# Patient Record
Sex: Male | Born: 1960 | Race: White | Hispanic: No | Marital: Single | Smoking: Never smoker
Health system: Southern US, Community
[De-identification: ages and names within clinical notes are randomized; demographics above are authoritative.]

## PROBLEM LIST (undated history)

## (undated) DIAGNOSIS — T790XXA Air embolism (traumatic), initial encounter: Secondary | ICD-10-CM

## (undated) DIAGNOSIS — E785 Hyperlipidemia, unspecified: Secondary | ICD-10-CM

## (undated) DIAGNOSIS — I10 Essential (primary) hypertension: Secondary | ICD-10-CM

## (undated) DIAGNOSIS — K5190 Ulcerative colitis, unspecified, without complications: Secondary | ICD-10-CM

## (undated) DIAGNOSIS — R1310 Dysphagia, unspecified: Secondary | ICD-10-CM

## (undated) DIAGNOSIS — I639 Cerebral infarction, unspecified: Secondary | ICD-10-CM

## (undated) HISTORY — PX: NO PAST SURGERIES: SHX2092

---

## 2006-06-06 ENCOUNTER — Emergency Department: Payer: Self-pay | Admitting: Emergency Medicine

## 2009-04-18 ENCOUNTER — Emergency Department: Payer: Self-pay | Admitting: Emergency Medicine

## 2009-10-26 ENCOUNTER — Emergency Department: Payer: Self-pay | Admitting: Emergency Medicine

## 2013-09-09 ENCOUNTER — Inpatient Hospital Stay: Payer: Self-pay | Admitting: Internal Medicine

## 2013-09-09 DIAGNOSIS — R55 Syncope and collapse: Secondary | ICD-10-CM

## 2013-09-09 DIAGNOSIS — R7989 Other specified abnormal findings of blood chemistry: Secondary | ICD-10-CM

## 2013-09-09 LAB — COMPREHENSIVE METABOLIC PANEL
Anion Gap: 6 — ABNORMAL LOW (ref 7–16)
BUN: 8 mg/dL (ref 7–18)
Bilirubin,Total: 0.8 mg/dL (ref 0.2–1.0)
Calcium, Total: 8.3 mg/dL — ABNORMAL LOW (ref 8.5–10.1)
EGFR (Non-African Amer.): >60
Glucose: 102 mg/dL — ABNORMAL HIGH (ref 65–99)
SGOT(AST): 19 U/L (ref 15–37)
SGPT (ALT): 15 U/L (ref 12–78)
Sodium: 137 mmol/L (ref 136–145)

## 2013-09-09 LAB — URINALYSIS, COMPLETE
Bilirubin,UR: NEGATIVE
Blood: NEGATIVE
Nitrite: NEGATIVE
Ph: 6 (ref 4.5–8.0)
RBC,UR: <1 /HPF (ref 0–5)
Specific Gravity: 1.011 (ref 1.003–1.030)
Squamous Epithelial: NONE SEEN
WBC UR: <1 /HPF (ref 0–5)

## 2013-09-09 LAB — TROPONIN I
Troponin-I: 0.11 ng/mL — ABNORMAL HIGH
Troponin-I: 0.06 ng/mL — ABNORMAL HIGH

## 2013-09-09 LAB — CBC
HCT: 40 % (ref 40.0–52.0)
MCH: 30.9 pg (ref 26.0–34.0)
MCHC: 34.8 g/dL (ref 32.0–36.0)
MCV: 89 fL (ref 80–100)
Platelet: 249 10*3/uL (ref 150–440)

## 2013-09-09 LAB — HEMOGLOBIN: HGB: 13.2 g/dL (ref 13.0–18.0)

## 2013-09-10 DIAGNOSIS — I059 Rheumatic mitral valve disease, unspecified: Secondary | ICD-10-CM

## 2013-09-10 LAB — COMPREHENSIVE METABOLIC PANEL
Albumin: 1.9 g/dL — ABNORMAL LOW (ref 3.4–5.0)
Alkaline Phosphatase: 63 U/L (ref 50–136)
Chloride: 109 mmol/L — ABNORMAL HIGH (ref 98–107)
Co2: 24 mmol/L (ref 21–32)
Creatinine: 0.81 mg/dL (ref 0.60–1.30)
EGFR (Non-African Amer.): >60
Osmolality: 281 (ref 275–301)
Potassium: 3.7 mmol/L (ref 3.5–5.1)
SGPT (ALT): 12 U/L (ref 12–78)
Sodium: 139 mmol/L (ref 136–145)

## 2013-09-10 LAB — CBC WITH DIFFERENTIAL/PLATELET
Basophil #: 0 10*3/uL (ref 0.0–0.1)
Basophil %: 0.2 %
Eosinophil %: 0.1 %
HGB: 12.3 g/dL — ABNORMAL LOW (ref 13.0–18.0)
MCH: 30.6 pg (ref 26.0–34.0)
MCV: 90 fL (ref 80–100)
Monocyte #: 0.3 x10 3/mm (ref 0.2–1.0)
Monocyte %: 4.5 %
Neutrophil %: 88.1 %
RBC: 4.02 10*6/uL — ABNORMAL LOW (ref 4.40–5.90)
WBC: 7.1 10*3/uL (ref 3.8–10.6)

## 2013-09-10 LAB — CK TOTAL AND CKMB (NOT AT ARMC)
CK, Total: 27 U/L — ABNORMAL LOW (ref 35–232)
CK-MB: 1.4 ng/mL (ref 0.5–3.6)

## 2013-09-10 LAB — MAGNESIUM: Magnesium: 1.5 mg/dL — ABNORMAL LOW

## 2013-09-10 LAB — CLOSTRIDIUM DIFFICILE BY PCR

## 2013-09-11 LAB — BASIC METABOLIC PANEL
BUN: 9 mg/dL (ref 7–18)
Calcium, Total: 7.7 mg/dL — ABNORMAL LOW (ref 8.5–10.1)
Chloride: 111 mmol/L — ABNORMAL HIGH (ref 98–107)
Creatinine: 0.93 mg/dL (ref 0.60–1.30)
EGFR (Non-African Amer.): >60
Glucose: 190 mg/dL — ABNORMAL HIGH (ref 65–99)
Sodium: 140 mmol/L (ref 136–145)

## 2013-09-11 LAB — MAGNESIUM: Magnesium: 1.9 mg/dL

## 2013-09-12 LAB — STOOL CULTURE

## 2013-12-17 ENCOUNTER — Emergency Department: Payer: Self-pay | Admitting: Emergency Medicine

## 2014-04-26 ENCOUNTER — Observation Stay: Payer: Self-pay | Admitting: Internal Medicine

## 2014-04-26 LAB — URINALYSIS, COMPLETE
BLOOD: NEGATIVE
Bacteria: NONE SEEN
Bilirubin,UR: NEGATIVE
GLUCOSE, UR: NEGATIVE mg/dL (ref 0–75)
Ketone: NEGATIVE
LEUKOCYTE ESTERASE: NEGATIVE
NITRITE: NEGATIVE
PROTEIN: NEGATIVE
Ph: 6 (ref 4.5–8.0)
RBC,UR: NONE SEEN /HPF (ref 0–5)
SPECIFIC GRAVITY: 1.008 (ref 1.003–1.030)
SQUAMOUS EPITHELIAL: NONE SEEN
WBC UR: 1 /HPF (ref 0–5)

## 2014-04-26 LAB — COMPREHENSIVE METABOLIC PANEL
ALK PHOS: 59 U/L
Albumin: 2.9 g/dL — ABNORMAL LOW (ref 3.4–5.0)
Anion Gap: 7 (ref 7–16)
BILIRUBIN TOTAL: 0.4 mg/dL (ref 0.2–1.0)
BUN: 10 mg/dL (ref 7–18)
CHLORIDE: 102 mmol/L (ref 98–107)
CREATININE: 1.12 mg/dL (ref 0.60–1.30)
Calcium, Total: 8.6 mg/dL (ref 8.5–10.1)
Co2: 25 mmol/L (ref 21–32)
EGFR (African American): >60
EGFR (Non-African Amer.): >60
Glucose: 188 mg/dL — ABNORMAL HIGH (ref 65–99)
Osmolality: 272 (ref 275–301)
Potassium: 3.6 mmol/L (ref 3.5–5.1)
SGOT(AST): 11 U/L — ABNORMAL LOW (ref 15–37)
SGPT (ALT): 15 U/L (ref 12–78)
Sodium: 134 mmol/L — ABNORMAL LOW (ref 136–145)
Total Protein: 6.7 g/dL (ref 6.4–8.2)

## 2014-04-26 LAB — HEMOGLOBIN: HGB: 8.8 g/dL — AB (ref 13.0–18.0)

## 2014-04-26 LAB — CBC WITH DIFFERENTIAL/PLATELET
Basophil #: 0 10*3/uL (ref 0.0–0.1)
Basophil %: 0.5 %
EOS ABS: 0.1 10*3/uL (ref 0.0–0.7)
Eosinophil %: 1 %
HCT: 29.7 % — AB (ref 40.0–52.0)
HGB: 9 g/dL — ABNORMAL LOW (ref 13.0–18.0)
LYMPHS PCT: 5.9 %
Lymphocyte #: 0.5 10*3/uL — ABNORMAL LOW (ref 1.0–3.6)
MCH: 19.5 pg — ABNORMAL LOW (ref 26.0–34.0)
MCHC: 30.3 g/dL — AB (ref 32.0–36.0)
MCV: 65 fL — ABNORMAL LOW (ref 80–100)
Monocyte #: 0.5 x10 3/mm (ref 0.2–1.0)
Monocyte %: 5.7 %
NEUTROS ABS: 8 10*3/uL — AB (ref 1.4–6.5)
Neutrophil %: 86.9 %
Platelet: 204 10*3/uL (ref 150–440)
RBC: 4.61 10*6/uL (ref 4.40–5.90)
RDW: 19 % — ABNORMAL HIGH (ref 11.5–14.5)
WBC: 9.3 10*3/uL (ref 3.8–10.6)

## 2014-04-26 LAB — IRON AND TIBC
Iron Bind.Cap.(Total): 325 ug/dL (ref 250–450)
Iron Saturation: 5 %
Iron: 16 ug/dL — ABNORMAL LOW (ref 65–175)
Unbound Iron-Bind.Cap.: 309 ug/dL

## 2014-04-26 LAB — TROPONIN I
TROPONIN-I: 0.06 ng/mL — AB
TROPONIN-I: 0.1 ng/mL — AB
TROPONIN-I: 0.13 ng/mL — AB

## 2014-04-26 LAB — FERRITIN: FERRITIN (ARMC): 4 ng/mL — AB (ref 8–388)

## 2014-04-27 LAB — CBC WITH DIFFERENTIAL/PLATELET
BASOS PCT: 0.1 %
Basophil #: 0 10*3/uL (ref 0.0–0.1)
Eosinophil #: 0 10*3/uL (ref 0.0–0.7)
Eosinophil %: 0 %
HCT: 28.8 % — ABNORMAL LOW (ref 40.0–52.0)
HGB: 8.7 g/dL — ABNORMAL LOW (ref 13.0–18.0)
LYMPHS ABS: 0.5 10*3/uL — AB (ref 1.0–3.6)
LYMPHS PCT: 9.2 %
MCH: 19.6 pg — ABNORMAL LOW (ref 26.0–34.0)
MCHC: 30.1 g/dL — ABNORMAL LOW (ref 32.0–36.0)
MCV: 65 fL — ABNORMAL LOW (ref 80–100)
MONO ABS: 0.1 x10 3/mm — AB (ref 0.2–1.0)
Monocyte %: 1.9 %
Neutrophil #: 4.4 10*3/uL (ref 1.4–6.5)
Neutrophil %: 88.8 %
Platelet: 200 10*3/uL (ref 150–440)
RBC: 4.41 10*6/uL (ref 4.40–5.90)
RDW: 18.5 % — ABNORMAL HIGH (ref 11.5–14.5)
WBC: 4.9 10*3/uL (ref 3.8–10.6)

## 2014-04-27 LAB — BASIC METABOLIC PANEL
Anion Gap: 4 — ABNORMAL LOW (ref 7–16)
BUN: 5 mg/dL — ABNORMAL LOW (ref 7–18)
CHLORIDE: 110 mmol/L — AB (ref 98–107)
CO2: 24 mmol/L (ref 21–32)
Calcium, Total: 8.7 mg/dL (ref 8.5–10.1)
Creatinine: 1.07 mg/dL (ref 0.60–1.30)
EGFR (African American): >60
Glucose: 144 mg/dL — ABNORMAL HIGH (ref 65–99)
OSMOLALITY: 275 (ref 275–301)
Potassium: 4.2 mmol/L (ref 3.5–5.1)
SODIUM: 138 mmol/L (ref 136–145)

## 2014-06-16 ENCOUNTER — Emergency Department: Payer: Self-pay | Admitting: Emergency Medicine

## 2014-06-16 LAB — COMPREHENSIVE METABOLIC PANEL
ALT: 18 U/L (ref 12–78)
ANION GAP: 7 (ref 7–16)
Albumin: 2.3 g/dL — ABNORMAL LOW (ref 3.4–5.0)
Alkaline Phosphatase: 66 U/L
BUN: 14 mg/dL (ref 7–18)
Bilirubin,Total: 0.3 mg/dL (ref 0.2–1.0)
CHLORIDE: 107 mmol/L (ref 98–107)
CREATININE: 1 mg/dL (ref 0.60–1.30)
Calcium, Total: 8.5 mg/dL (ref 8.5–10.1)
Co2: 24 mmol/L (ref 21–32)
EGFR (Non-African Amer.): >60
Glucose: 110 mg/dL — ABNORMAL HIGH (ref 65–99)
Osmolality: 277 (ref 275–301)
POTASSIUM: 3.7 mmol/L (ref 3.5–5.1)
SGOT(AST): 14 U/L — ABNORMAL LOW (ref 15–37)
Sodium: 138 mmol/L (ref 136–145)
TOTAL PROTEIN: 6.4 g/dL (ref 6.4–8.2)

## 2014-06-16 LAB — CBC
HCT: 26.9 % — ABNORMAL LOW (ref 40.0–52.0)
HGB: 8 g/dL — AB (ref 13.0–18.0)
MCH: 19.3 pg — ABNORMAL LOW (ref 26.0–34.0)
MCHC: 29.8 g/dL — AB (ref 32.0–36.0)
MCV: 65 fL — ABNORMAL LOW (ref 80–100)
Platelet: 220 10*3/uL (ref 150–440)
RBC: 4.15 10*6/uL — AB (ref 4.40–5.90)
RDW: 20.3 % — AB (ref 11.5–14.5)
WBC: 7.1 10*3/uL (ref 3.8–10.6)

## 2014-06-16 LAB — TROPONIN I: Troponin-I: 0.03 ng/mL

## 2014-08-20 ENCOUNTER — Inpatient Hospital Stay: Payer: Self-pay | Admitting: Internal Medicine

## 2014-08-20 LAB — CBC WITH DIFFERENTIAL/PLATELET
BASOS ABS: 0 10*3/uL (ref 0.0–0.1)
BASOS PCT: 0.5 %
Eosinophil #: 0.3 10*3/uL (ref 0.0–0.7)
Eosinophil %: 4.3 %
HCT: 26.6 % — ABNORMAL LOW (ref 40.0–52.0)
HGB: 7.4 g/dL — ABNORMAL LOW (ref 13.0–18.0)
LYMPHS PCT: 16.8 %
Lymphocyte #: 1.1 10*3/uL (ref 1.0–3.6)
MCH: 17.2 pg — ABNORMAL LOW (ref 26.0–34.0)
MCHC: 27.9 g/dL — ABNORMAL LOW (ref 32.0–36.0)
MCV: 62 fL — ABNORMAL LOW (ref 80–100)
MONO ABS: 0.7 x10 3/mm (ref 0.2–1.0)
Monocyte %: 10.2 %
Neutrophil #: 4.6 10*3/uL (ref 1.4–6.5)
Neutrophil %: 68.2 %
PLATELETS: 236 10*3/uL (ref 150–440)
RBC: 4.3 10*6/uL — AB (ref 4.40–5.90)
RDW: 19.7 % — ABNORMAL HIGH (ref 11.5–14.5)
WBC: 6.7 10*3/uL (ref 3.8–10.6)

## 2014-08-20 LAB — BASIC METABOLIC PANEL
ANION GAP: 10 (ref 7–16)
BUN: 9 mg/dL (ref 7–18)
CALCIUM: 8.1 mg/dL — AB (ref 8.5–10.1)
CHLORIDE: 108 mmol/L — AB (ref 98–107)
CREATININE: 1.24 mg/dL (ref 0.60–1.30)
Co2: 20 mmol/L — ABNORMAL LOW (ref 21–32)
Glucose: 167 mg/dL — ABNORMAL HIGH (ref 65–99)
Osmolality: 278 (ref 275–301)
Potassium: 3.4 mmol/L — ABNORMAL LOW (ref 3.5–5.1)
Sodium: 138 mmol/L (ref 136–145)

## 2014-08-20 LAB — URINALYSIS, COMPLETE
Bacteria: NONE SEEN
Bilirubin,UR: NEGATIVE
Blood: NEGATIVE
Glucose,UR: 50 mg/dL (ref 0–75)
Ketone: NEGATIVE
Leukocyte Esterase: NEGATIVE
Nitrite: NEGATIVE
PH: 5 (ref 4.5–8.0)
Protein: 30
RBC,UR: 1 /HPF (ref 0–5)
SQUAMOUS EPITHELIAL: NONE SEEN
Specific Gravity: 1.027 (ref 1.003–1.030)
WBC UR: <1 /HPF (ref 0–5)

## 2014-08-20 LAB — TROPONIN I: Troponin-I: 0.16 ng/mL — ABNORMAL HIGH

## 2014-08-21 LAB — CBC WITH DIFFERENTIAL/PLATELET
Basophil #: 0 10*3/uL (ref 0.0–0.1)
Basophil %: 0.8 %
EOS PCT: 6.4 %
Eosinophil #: 0.3 10*3/uL (ref 0.0–0.7)
HCT: 24.9 % — AB (ref 40.0–52.0)
HGB: 7.4 g/dL — ABNORMAL LOW (ref 13.0–18.0)
LYMPHS ABS: 1 10*3/uL (ref 1.0–3.6)
Lymphocyte %: 18.9 %
MCH: 18.9 pg — ABNORMAL LOW (ref 26.0–34.0)
MCHC: 29.6 g/dL — AB (ref 32.0–36.0)
MCV: 64 fL — ABNORMAL LOW (ref 80–100)
Monocyte #: 0.7 x10 3/mm (ref 0.2–1.0)
Monocyte %: 13.1 %
NEUTROS ABS: 3.2 10*3/uL (ref 1.4–6.5)
NEUTROS PCT: 60.8 %
Platelet: 186 10*3/uL (ref 150–440)
RBC: 3.9 10*6/uL — AB (ref 4.40–5.90)
RDW: 21.8 % — ABNORMAL HIGH (ref 11.5–14.5)
WBC: 5.2 10*3/uL (ref 3.8–10.6)

## 2014-08-21 LAB — LIPID PANEL
Cholesterol: 65 mg/dL (ref 0–200)
HDL: 28 mg/dL — AB (ref 40–60)
Ldl Cholesterol, Calc: 24 mg/dL (ref 0–100)
Triglycerides: 63 mg/dL (ref 0–200)
VLDL Cholesterol, Calc: 13 mg/dL (ref 5–40)

## 2014-08-21 LAB — OCCULT BLOOD X 1 CARD TO LAB, STOOL: OCCULT BLOOD, FECES: NEGATIVE

## 2014-08-21 LAB — BASIC METABOLIC PANEL
Anion Gap: 4 — ABNORMAL LOW (ref 7–16)
BUN: 8 mg/dL (ref 7–18)
CHLORIDE: 109 mmol/L — AB (ref 98–107)
CO2: 26 mmol/L (ref 21–32)
CREATININE: 1.12 mg/dL (ref 0.60–1.30)
Calcium, Total: 8.1 mg/dL — ABNORMAL LOW (ref 8.5–10.1)
Glucose: 102 mg/dL — ABNORMAL HIGH (ref 65–99)
OSMOLALITY: 276 (ref 275–301)
Potassium: 3.4 mmol/L — ABNORMAL LOW (ref 3.5–5.1)
Sodium: 139 mmol/L (ref 136–145)

## 2014-08-21 LAB — FOLATE: FOLIC ACID: 13.6 ng/mL (ref 3.1–100.0)

## 2014-08-21 LAB — TSH
Thyroid Stimulating Horm: 0.731 u[IU]/mL
Thyroid Stimulating Horm: 0.699 u[IU]/mL

## 2014-08-21 LAB — HEMOGLOBIN A1C: Hemoglobin A1C: <3.5 % — ABNORMAL LOW (ref 4.2–6.3)

## 2014-08-21 LAB — TROPONIN I: TROPONIN-I: 0.17 ng/mL — AB

## 2014-08-21 LAB — SEDIMENTATION RATE: ERYTHROCYTE SED RATE: 17 mm/h (ref 0–20)

## 2014-08-22 LAB — BASIC METABOLIC PANEL
Anion Gap: 9 (ref 7–16)
BUN: 5 mg/dL — AB (ref 7–18)
CO2: 24 mmol/L (ref 21–32)
CREATININE: 1.08 mg/dL (ref 0.60–1.30)
Calcium, Total: 8 mg/dL — ABNORMAL LOW (ref 8.5–10.1)
Chloride: 110 mmol/L — ABNORMAL HIGH (ref 98–107)
EGFR (Non-African Amer.): >60
Glucose: 80 mg/dL (ref 65–99)
Osmolality: 281 (ref 275–301)
POTASSIUM: 3.7 mmol/L (ref 3.5–5.1)
Sodium: 143 mmol/L (ref 136–145)

## 2014-08-22 LAB — HEMOGLOBIN: HGB: 10.6 g/dL — ABNORMAL LOW (ref 13.0–18.0)

## 2014-08-22 LAB — CLOSTRIDIUM DIFFICILE(ARMC)

## 2014-08-22 LAB — MAGNESIUM: Magnesium: 1.9 mg/dL

## 2015-03-31 NOTE — Consult Note (Signed)
Pt CC: bloody diarrhea.  4 bowel movements today, no vomiting, afebrile, diarrhea better, abd not tender, CRP value is 58, Alb 1.9, WBC 7, hgb 12. Pt improving rapidly on steroids.  May go home tomorrow if can get steroids, take 60 mg a day and taper by 5 mg per week, see me in office in 2-3 weeks or [facility]  Dr, Shelle Ironein on call this weekend but will not see unless you call him about a problem.  Electronic Signatures: Scot JunElliott, Robert T (MD)  (Signed on 03-Oct-14 16:58)  Authored  Last Updated: 03-Oct-14 16:58 by Scot JunElliott, Robert T (MD)

## 2015-03-31 NOTE — Consult Note (Signed)
Pt CC: diarrhea and bleeding.  Pt feeling better already after iv solumedrol.  Await cultures and C.diff test.  Pt wants to leave as soon as possible but needs to stay 2-3 days at least to see improvement before discharge.  Can advance to full liquids if does well over night. .  Electronic Signatures: Scot JunElliott, Robert T (MD)  (Signed on 02-Oct-14 20:12)  Authored  Last Updated: 02-Oct-14 20:12 by Scot JunElliott, Robert T (MD)

## 2015-03-31 NOTE — Consult Note (Signed)
PATIENT NAME:  [name], [name] MR#:  [id number] DATE OF BIRTH:  06-13-1961  DATE OF CONSULTATION:  09/09/2013  CARDIAC CONSULTATION  REFERRING PHYSICIAN:  Dr. Elpidio AnisSudini CONSULTING PHYSICIAN:  Jerolyn CenterMuhammad A. Kirke CorinArida, MD  REASON FOR CONSULTATION:  Elevated cardiac enzymes.   HISTORY OF PRESENT ILLNESS: This is a 54 year old Caucasian male with no previous cardiac history. He has known history of ulcerative colitis. He ran out of his medication for about a month. He presented to the hospital with bloody diarrhea for 2 weeks. While in the Emergency Room, he was getting an IV blood draw and he started watching the tech. He got dizzy and lightheaded and had a sinus pause for a few seconds. EKG showed normal sinus rhythm with no significant ischemic changes. His troponin was mildly elevated at 0.11. The patient denies any chest pain or dyspnea even with physical activities.   PAST MEDICAL HISTORY:  Ulcerative colitis.   HOME MEDICATIONS:  Include Delzicol.   ALLERGIES:  PENICILLIN.   FAMILY HISTORY:  Negative for premature coronary artery disease.   SOCIAL HISTORY:  Negative for smoking, alcohol or recreational drug use.   REVIEW OF SYSTEMS: A 10-point review of systems was performed. It is negative other than what is mentioned in the HPI.   PHYSICAL EXAMINATION: GENERAL:  The patient appears to be at his stated age, in no acute distress.  VITAL SIGNS: Temperature is 97.8, pulse 80, respiratory rate 18, blood pressure 118/72 and oxygen saturation is 96% on room air.  HEENT:  Normocephalic, atraumatic.  NECK:  No JVD or carotid bruits.  RESPIRATORY:  Normal respiratory effort with no use of accessory muscles. Auscultation reveals normal breath sounds.  CARDIOVASCULAR:  Normal PMI. Normal S1 and S2 with no gallops or murmurs.  ABDOMEN:  Benign, nontender, nondistended.  EXTREMITIES:  No clubbing, cyanosis or edema.  SKIN:  Warm and dry with no rash.  PSYCHIATRIC:  Alert and oriented x 3 with normal  mood and affect.   LABORATORY AND DIAGNOSTIC DATA: EKG showed normal sinus rhythm with no significant ST or T wave changes. Troponin was 0.11. Renal function is normal. White cell count was borderline elevated at 10.8 with hemoglobin of 13.9.   IMPRESSION: 1.  Syncope, likely neurocardiogenic vasovagal response.  2. Elevated cardiac enzymes likely due to supply-demand ischemia with no symptoms suggestive of angina. 3.  Ulcerative colitis.   RECOMMENDATIONS: The patient reports losing consciousness for only a few seconds associated with a sinus pause which was likely vasovagal response. I suspect that his elevated troponin is likely due to supply-demand ischemia and does not reflect true myocardial infarction. He reports no symptoms suggestive of angina. An echocardiogram was ordered and will be reviewed. If the echocardiogram does not show any significant structural abnormalities, no further cardiac workup is recommended at this time.    ____________________________ Chelsea AusMuhammad A. Kirke CorinArida, MD maa:ce Rosario: 09/09/2013 17:29:11 ET T: 09/09/2013 17:37:26 ET JOB#: [id number]  cc: Raneshia Derick A. Kirke CorinArida, MD, <Dictator> Iran OuchMUHAMMAD A Morena Mckissack MD ELECTRONICALLY SIGNED 09/20/2013 14:01

## 2015-03-31 NOTE — H&P (Signed)
PATIENT NAME:  [name], Levon D MR#:  [id number] DATE OF BIRTH:  Nov 13, 1961  DATE OF ADMISSION:  09/09/2013  PRIMARY GI: [facility].   CHIEF COMPLAINT: Diarrhea; bloody stools.   HISTORY OF PRESENT ILLNESS: A 54 year old Caucasian male patient with a history of ulcerative colitis who was on Delzicol at home. Ran out of his medications a month back. He did go [facility] yesterday for his medication refill, but he still has not gotten his refill. The patient presents today with diarrhea, bloody stools for 2 weeks.   The patient mentions that he has had watery diarrhea multiple times a day with blood mixed in it. No nausea or vomiting, or abdominal pain. No shortness of breath, palpitations, lightheadedness. He is not on any aspirin, NSAIDs or other blood thinners.   In the Emergency Room the patient was getting an IV blood draw, and the patient had a brief episode of asystole lasting about 4 to 5 seconds with bradycardia, with which he felt lightheaded but recovered quickly. EKG has normal sinus rhythm. No acute ST wave changes initially, and after his episode EKG is the same. No history of heart disease. His troponin is elevated at 0.11.   PAST MEDICAL HISTORY: Ulcerative colitis.   SOCIAL HISTORY: The patient does not smoke. Occasional alcohol. No illicit drugs.   CODE STATUS: Full code.   FAMILY HISTORY: Diabetes and emphysema in his father. No ulcerative colitis.   ALLERGIES: Penicillin, which causes hives.   HOME MEDICATIONS: Include Delzicol, at unknown dose.   REVIEW OF SYSTEMS:  CONSTITUTIONAL: Complains of fatigue. No fever, weight loss, weight gain.  EYES: No blurred vision, pain or redness.   ENT: No tinnitus, ear pain, hearing loss.  RESPIRATORY: No cough, wheeze, hemoptysis.  CARDIOVASCULAR: No chest pain, orthopnea, edema.  GASTROINTESTINAL: Has had no nausea, vomiting. Has had diarrhea, bloody stools.  GENITOURINARY: No dysuria, hematuria, frequency.   ENDOCRINE: No polyuria, nocturia, thyroid problems.  HEMATOLOGIC/LYMPHATIC: Has been with bloody stools. No anemia, easy bruising.  INTEGUMENTARY:no petechiae, rash, lesions.  MUSCULOSKELETAL: No back pain, arthritis.  NEUROLOGIC: No focal numbness, weakness, seizures.  PSYCHIATRIC: No anxiety or depression.   PHYSICAL EXAMINATION: VITAL SIGNS: Temperature 98.2, pulse 100, blood pressure 129/80, saturating 98% on room air.  GENERAL: Moderately-built Caucasian male patient lying in bed, seems comfortable. Conversational, cooperative with exam.  PSYCHIATRIC: He is alert and oriented x 3. Mood and affect appropriate. Judgment intact.  HEENT: Atraumatic, normocephalic. Oral mucosa moist and pink. External ears and nose normal. No pallor. No icterus. Pupils equally reactive to light.  NECK: Supple. No thyromegaly. No palpable lymph nodes. Trachea midline. No JVD.  CARDIOVASCULAR: S1, S2, without any murmurs. Peripheral pulses 2+.  RESPIRATORY: Normal work of breathing. Clear to auscultation on both sides.  GASTROINTESTINAL: Soft abdomen. Tenderness in the lower abdomen. No rigidity or guarding. Bowel sounds present.  GENITOURINARY: No CVA tenderness or bladder distention.  SKIN: Warm and dry. No petechiae, rash, ulcers.  MUSCULOSKELETAL: No joint redness, effusion of the large joints. Normal muscle tone.  NEUROLOGICAL: Motor sensation 5/5 in upper and lower extremities. Sensation is intact all over.  LYMPHATIC: No cervical lymphadenopathy.   LAB STUDIES: Show a glucose of 102, BUN 8, creatinine 1.01, sodium 137, potassium  3.7, chloride 104, bicarb 27, GFR of greater than 60, calcium 8.3. Lipase of 89.   AST, ALT, alkaline phosphatase normal. Albumin 2.3. Troponin 0.11, WBC 10.8, hemoglobin 13.9, platelets of 249, blood group O-positive.   EKG shows normal sinus  rhythm. No acute ST-T wave changes. P-R interval is 152   ASSESSMENT AND PLAN: 1. Ulcerative colitis, with bloody stools: The  patient ran out of his medications and went back. He got a dose of Solu-Medrol IV. I put him on 40 mg q. 8 hours IV while in the hospital, and also started him on his Delzicol 800 mg 3 times a day until we know what dose he was on at home. The patient does not have any anemia at this time, but seems dehydrated. Will rehydrate, and I expect his blood is hemoconcentrated. Will watch, and if he has any further bleeding or significant drop in hemoglobin will need blood transfusion. Will have GI see the patient. He is afebrile. Does not have any elevated white count. I do not think he needs any imaging studies or procedures at this time.  2.  Asystole: The patient had brief asystole of about 5 seconds while he was getting a blood draw, with lightheadedness. Did not have syncope. We will admit him on the telemetry floor. Also had a mild elevation in troponin of 0.11. Could be likely secondary from his acute illness. No history of cardiac disease. Will get an echocardiogram, check 2 more sets of cardiac enzymes. I have discussed with Dr. Mariah Milling, who will see the patient. His episode was likely vasovagal. Will check a magnesium level.  5.  Deep vein thrombosis prophylaxis with sequential compression devices.   CODE STATUS: Full code.   Time spent today on this case was fifty-five minutes.    ____________________________ Molinda Bailiff Shadonna Benedick, MD srs:dm D: 09/09/2013 11:38:26 ET T: 09/09/2013 11:57:09 ET JOB#: [id number]  cc: Wardell Heath R. Fedra Lanter, MD, <Dictator> [facility]  [facility]) Wardell Heath [place] B[place] MD ELECTRONICALLY SIGNED 09/09/2013 14:56

## 2015-03-31 NOTE — Discharge Summary (Signed)
PATIENT NAME:  [name], Gawain D MR#:  [id number] DATE OF BIRTH:  Aug 27, 1961  DATE OF ADMISSION:  09/09/2013 DATE OF DISCHARGE:   09/11/2013  DISCHARGE DIAGNOSIS:  Ulcerative colitis.   CONSULTANTS: Dr. Mechele CollinElliott of GI.   IMAGING STUDIES: None.   ADMITTING HISTORY AND PHYSICAL: Please see detailed H and P dictated previously. In brief, a 54 year old male patient with history of ulcerative colitis ran out of his medications. He presented to the hospital complaining of diarrhea and bloody stools. The patient was admitted for ulcerative colitis flare-up.   HOSPITAL COURSE: The patient was started on IV Solu-Medrol along with Delzicol, his medication he ran out of. Dr. Mechele CollinElliott was consulted and suggested continuing the same treatment. The patient, by the day of discharge is improved. Does not have any tenderness on abdominal examination with bowel sounds present. He is being discharged home on a steroid taper and his delzicol.   DISCHARGE MEDICATIONS: Include:  1.  Prednisone 10 mg started with 60 mg tapered 10 mg every 10 days. One month supply given. The patient will follow up with Dr. Mechele CollinElliott.  2.  Acetaminophen 650 mg every 4 hours as needed.  3.  Mesalamine 400 mg delayed release 2 capsules oral 3 times a day.   DISCHARGE INSTRUCTIONS: Regular low residue diet. Activity as tolerated. Follow up with Dr. Mechele CollinElliott in 2 weeks.   Time spent on day of discharge in discharge activity was 45 minutes.     ____________________________ Molinda BailiffSrikar R. Kyre Jeffries, MD srs:dp D: 09/11/2013 16:17:33 ET T: 09/11/2013 16:37:19 ET JOB#: [id number]  cc: Wardell HeathSrikar R. Elpidio AnisSudini, MD, <Dictator> Scot Junobert T. Elliott, MD Orie FishermanSRIKAR R Takiesha Mcdevitt MD ELECTRONICALLY SIGNED 09/12/2013 2:36

## 2015-03-31 NOTE — Consult Note (Signed)
Brief Consult Note: Diagnosis: supply demand ischemia, vasovagal syncope.   Comments: check echo and Troponin trend. If normal, no further cardiac work up.  Electronic Signatures: Lorine BearsArida, Muhammad (MD)  (Signed 02-Oct-14 17:30)  Authored: Brief Consult Note   Last Updated: 02-Oct-14 17:30 by Lorine BearsArida, Muhammad (MD)

## 2015-03-31 NOTE — Consult Note (Signed)
PATIENT NAME:  [name], [name] MR#:  [id number] DATE OF BIRTH:  Aug 18, 1961  DATE OF CONSULTATION:  09/09/2013  REFERRING PHYSICIAN:  Dr. Elpidio Anis CONSULTING PHYSICIAN:  Lynnae Prude, MD/Kemar Pandit A. Arvilla Market, ANP (Adult Nurse Practitioner)  REASON FOR CONSULTATION: History of ulcerative colitis presents with bloody diarrhea.  HISTORY OF PRESENT ILLNESS:  This 54 year old patient states he does not have a family physician. He reports he was diagnosed with ulcerative colitis in the large intestine about 8 years ago with symptoms initially of bloody diarrhea, abdominal pain, weight loss. He was treated  with prednisone and Delzico by Dr. Madelin Rear at [facility].   The patient states that he has been on Delzicol initially 12 tablets daily and now recently 8 tablets daily until he ran out of the medication 2 months ago and could not get refills. He says he was getting the medication at [facility] for a discount of $4 per prescription. Delzicol 8 tablets daily was controlling his ulcerative colitis very well. He reports his most recent colonoscopy was about 2012 and the physician could not get all the way around the colon because of some narrowing. The patient states his appetite, diet and weight have all been normal until he stopped his medication.   He stopped the medication because he thought he was normal again. The first month without Delzicol he started to notice gassiness, some grumbling and more flatus. This most recent month, he has had weight loss from 212 down to 191, watery bloody diarrhea 10 times a day and several nocturnal awakenings. He has had some chills and sweats at night, mild abdominal discomfort diffuse.  He denies any nausea or vomiting,  The patient has maintained diet and appetite and feels hungry now.  The patient presented to the Emergency Room for assistance as he says he has not been able to get into his GI doctor in a timely fashion.  The patient has been placed on IV prednisone, IV  Solu-Medrol, and he states he is feeling better already. Labs showed an  albumin of 2.3, WBC 10.8, normal hemoglobin at 13.9. Gastroenterology has been consulted for further evaluaotn and management of likely ulcerative colitis flair.   PAST MEDICAL HISTORY: 1.  Ulcerative colitis diagnosed about 8 years ago followed previously by Dr. Madelin Rear, [facility]. The patient reports last colonoscopy 2012. Would need records. 2.  Osgood-Schlatter left knee.  MEDICATIONS: 1.  None on admission, except leftover prednisone possibly 10 mg, 1 over the last 3 days.  2.  Negative NSAIDs antibiotic. He is supposed to be on Delzicol 8 tablets daily, discontinued about 2 months ago.   ALLERGIES:  PENICILLIN YIELDS HIVES.  REVIEW OF SYSTEMS: 10-systems review positive for some chills and sweats only at night. No fever. Positive weight loss, positive GI history as noted in the history of present illness; otherwise, 10 systems negative.   PHYSICAL EXAMINATION: VITAL SIGNS:  97.8, 92, 18, 124/89, 95% room air oxygen.  GENERAL: Moderate-build Caucasian male, looks healthy and well-nourished NAD.  He looks relaxed.  HEENT: Shows head is normocephalic. Conjunctivae are pink. Sclerae is anicteric. Oral mucosa is moist and intact. No ulcers noted.  NECK:  Supple.  No thyromegaly.  No ulcers noted. Teeth are missing.  CARDIOVASCULAR:  S1, S2 without murmur or gallop. RESPIRATIONS:  Lungs are CTA.  Respirations are eupneic.  ABDOMEN: Soft, nontender except slightly right lower quadrant, more so than left. No HSM or masses.  RECTAL:  Deferred.  GENITOURINARY:  No bladder distention  noted.  SKIN:  Warm and dry without rash.  MUSCULOSKELETAL:  No joint edema, left knee not consistent with his history of Osgood-Schlatter. EXTREMITIES:  Lower extremities without edema, cyanosis or clubbing.  SKIN: Warm and dry.   LABORATORY DATA:  Admission laboratory studies notable for BUN 8, creatinine 1.01. Normal electrolytes,  lipase 89, albumin 2.3. Liver panel unremarkable. Troponin elevated 0.11, WBC 10.8, sed rate is 12. Hemoglobin is 13.9, platelets 249, MCV 89. Urinalysis is unremarkable.   RADIOLOGY:  No studies to report.   IMPRESSION: The patient presents with reported history of ulcerative colitis diagnosed approximately 8 years ago and he was well controlled on reported does of Delzicol 8 tabs daily. He stopped the Delzicol mediation  2 months or more and abdominal symptoms started  with gradual worsening of loose stools, gassiness and escalated to abdominal discomfort, copious bloody diarrhea consistent with his ususal ulcerative colitis flare.  He seems to be responding to the IV Solu-Medrol.   PLAN: 1.  Continue with current therapy with Solu-Medrol.  2.  Stool studies for Clostridium difficile comprehensive culture. 3. Consider antibiotic therapy for abdominal discomfort mild and mild right lower quadrant tenderness on examination. The patient reports chills and sweats at night; he presents afebrile without leukocytosis. 4.  Medical noncompliance addressed with patient today in detail with his girlfriend present. 5.  The patient is on clear liquid diet requesting advancement in diet. Luminal evaluation per Dr. Mechele CollinElliott review. 6.  He will need to be restarted on his Delzicol delayed release 800 mg t.i.d. per Dr. Mechele CollinElliott review.  Thank you for the consultation.  This case was discussed with Dr. Mechele CollinElliott.  These services provided by Cala BradfordKimberly A. Arvilla MarketMills, MS, APRN, BC, ANP under collaborative agreement with Lynnae Prudeobert Elliott, M.D.    ____________________________ Ranae PlumberKimberly A. Arvilla MarketMills, ANP (Adult Nurse Practitioner) kam:ce D: 09/09/2013 17:36:58 ET T: 09/09/2013 17:49:18 ET JOB#: [id number]  cc: Cala BradfordKimberly A. Arvilla MarketMills, ANP (Adult Nurse Practitioner), <Dictator> Ranae PlumberKimberly A. Suzette BattiestMills RN, MSN, ANP-BC Adult Nurse Practitioner ELECTRONICALLY SIGNED 09/09/2013 19:01

## 2015-04-01 NOTE — Consult Note (Signed)
Chief Complaint:  Subjective/Chief Complaint recent bilateral stroke.  the patient refused TEE. refuses further workup   VITAL SIGNS/ANCILLARY NOTES: **Vital Signs.:   15-Sep-15 11:30  Vital Signs Type Routine  Temperature Temperature (F) 97.9  Celsius 36.6  Temperature Source oral  Pulse Pulse 102  Systolic BP Systolic BP 725  Diastolic BP (mmHg) Diastolic BP (mmHg) 90  Mean BP 105  Pulse Ox % Pulse Ox % 100  Pulse Ox Activity Level  With exertion  Oxygen Delivery Room Air/ 21 %  *Intake and Output.:   15-Sep-15 11:15  Grand Totals Intake:  240 Output:      Net:  240 32 Hr.:  240  Percentage of Meal Eaten  100   Brief Assessment:  GEN well developed, well nourished, no acute distress   Cardiac Regular  murmur present   Respiratory normal resp effort  clear BS   Gastrointestinal Normal   Gastrointestinal details normal Soft  Nontender  Nondistended  No masses palpable   EXTR negative cyanosis/clubbing, negative edema   Additional Physical Exam bilateral upper extremity hemi pareses   Lab Results: Routine BB:  14-Sep-15 00:00   Crossmatch Unit 1 Transfused  Result(s) reported on 23 Aug 2014 at 07:02AM.  Routine Micro:  14-Sep-15 21:26   Micro Text Report CLOSTRIDIUM DIFFICILE   C.DIFFICILE ANTIGEN       C.DIFFICILE GDH ANTIGEN : NEGATIVE   C.DIFFICILE TOXIN A/B     C.DIFFICILE TOXINS A AND B : NEGATIVE   INTERPRETATION            Negative for C. difficile.    ANTIBIOTIC                        General Ref:  14-Sep-15 18:31   Lupus Anticoagulant Comprehensive ========== TEST NAME ==========  ========= RESULTS =========  = REFERENCE RANGE =  LUPUS COMPREHNSV PROFILE  Lupus Anticoagulant Comp Dilute Prothrombin Time(dPT)    [   43.1 sec             ]          0.0-55.0 dPT Confirm Ratio               [  1.01 Ratio           ]         0.00-1.20 Thrombin Time                   [   16.6 sec             ]          0.0-20.0 PTT-LA                          [    34.0 sec             ]          0.0-50.0 dRVVT                           [   31.3 sec      ]          0.0-55.1 Lupus Reflex Interpretation     [   Comment:             ]                   No  lupus anticoagulant was detected.               [facility]            No: [phone]           [address], [place], [place] [address]           Lindon Romp, MD         [phone]   Result(s) reported on 24 Aug 2014 at 08:04AM.  Protein C and S Panel ========== TEST NAME ==========  ========= RESULTS =========  = REFERENCE RANGE =  PROTEIN C AND S PANEL  PrtCAg+PrtSAg Protein C Antigen               [L  47 %                 ]            70-140 A deficiency of protein C (PC), either congenital or acquired, increases the risk of thromboembolism. Congenital deficiencies of PC are very rare; acquired PC deficiency is much more common. PC levels can be transiently diminished after an acute thrombotic event. Oral anticoagulant therapy with warfarin will lower PC levels as well as vitamin K deficiency. Acquired deficiency can also occur in individuals with disseminated intravascular coagulation (DIC), sepsis, severe liver disease, nephrotic syndrome, and in inflammatory bowel disease. Levels may be spuriously decreased in individuals with Factor V Leiden. Drug therapy with L-asparaginse, fluorouracil, methotrexate, cyclophosphamide or tamoxifen can also reduce PC levels. It has been suggested that repeat blood sampling and testing after ruling out acquired causes of deficiency should be performed before the patient is diagnosed with congenital Protein C deficiency. Protein S, Total                [   107 %                ]            58-150 Protein S, Free     [   104 %                ]            [address]               [facility]            No: [phone]           [address] [place], [place] [address]           Lindon Romp, MD         [phone]    Result(s) reported on 24 Aug 2014 at 06:49AM.  Routine Chem:  14-Sep-15 02:02   Glucose, Serum 80  BUN  5  Creatinine (comp) 1.08  Sodium, Serum 143  Potassium, Serum 3.7  Chloride, Serum  110  CO2, Serum 24  Calcium (Total), Serum  8.0  Anion Gap 9  Osmolality (calc) 281  eGFR (African American) >60  eGFR (Non-African American) >60 (eGFR values <39m/min/1.73 m2 may be an indication of chronic kidney disease (CKD). Calculated eGFR is useful in patients with stable renal function. The eGFR calculation will not be reliable in acutely ill patients when serum creatinine is changing rapidly. It is not useful in  patients on dialysis. The eGFR calculation may not be applicable to patients at the low and high extremes of body sizes, pregnant women, and vegetarians.)  Magnesium, Serum 1.9 (1.8-2.4 THERAPEUTIC RANGE:  4-7 mg/dL TOXIC: > 10 mg/dL  -----------------------)  Routine Hem:  14-Sep-15 18:31   Hemoglobin (CBC)  10.6 (Result(s) reported on 22 Aug 2014 at 07:05PM.)   Radiology Results: XRay:    12-Sep-15 17:38, Chest PA and Lateral  Chest PA and Lateral   REASON FOR EXAM:    left hand weakness  COMMENTS:       PROCEDURE: DXR - DXR CHEST PA (OR AP) AND LATERAL  - Aug 20 2014  5:38PM     CLINICAL DATA:  54 year old male with left hand weakness.    EXAM:  CHEST  2 VIEW    COMPARISON:  04/26/2014 and 04/18/2009 chest radiographs    FINDINGS:  The cardiomediastinal silhouette is unremarkable.  There is no evidence of focal airspace disease, pulmonary edema,  suspicious pulmonary nodule/mass, pleural effusion, or pneumothorax.  No acute bony abnormalities are identified.     IMPRESSION:  No active cardiopulmonary disease.      Electronically Signed    By: Hassan Rowan M.D.    On: 08/20/2014 17:44         Verified By: Lura Em, M.D.,  [place]:    13-Sep-15 10:10, US Carotid Doppler Bilateral  US Carotid Doppler Bilateral   REASON FOR EXAM:    cva  COMMENTS:        PROCEDURE: [place]  - US CAROTID DOPPLER BILATERAL  - Aug 21 2014 10:10AM     CLINICAL DATA:  cva, syncope    EXAM:  BILATERAL CAROTID DUPLEX ULTRASOUND    TECHNIQUE:  Pearline Cables scale imaging, color Doppler and duplex ultrasound was  performed of bilateral carotid and vertebral arteries in the neck.    COMPARISON:  None.  REVIEW OF SYSTEMS:  Quantification of carotid stenosis is based on velocity parameters  that correlate the residual internal carotid diameter with  NASCET-based stenosis levels, using the diameter of the distal  internal carotid lumen as the denominator for stenosis measurement.    The following velocity measurements were obtained:    PEAK SYSTOLIC/END DIASTOLIC    RIGHT    ICA:    61/22cm/sec    CCA:                     67/67MC/NOB  SYSTOLIC ICA/CCA RATIO:  0.96    DIASTOLIC ICA/CCA RATIO: 2.83    ECA:                     65cm/sec    LEFT    ICA:                     49/18cm/sec    CCA:                     66/29UT/MLY    SYSTOLIC ICA/CCA RATIO:  6.50    DIASTOLIC ICA/CCA RATIO: 1.1  ECA:                     47cm/sec    FINDINGS:  RIGHT CAROTID ARTERY: Minimal intimal thickening. No focal plaque  accumulation or stenosis. Normal waveforms and color Doppler signal.    RIGHT VERTEBRAL ARTERY:  Normal flow direction and waveform.    LEFT CAROTID ARTERY: Mild intimal thickening. Smooth nonocclusive  plaque in the carotid bulb, and proximal ICA. No high-grade  stenosis. Normal waveforms and color Doppler signal.    LEFT VERTEBRAL ARTERY: Normal flow direction and waveform.     IMPRESSION:  1. Mild left carotid bifurcation and proximal ICA plaque, resulting  in less than 50% diameter stenosis. The exam does not exclude plaque  ulceration or embolization. Continued surveillance recommended.      Electronically Signed    By: Arne Cleveland M.D.    On: 08/21/2014 10:20         Verified By: Kandis Cocking, M.D.,  MRI:    14-Sep-15 08:32, MRI  Brain Without Contrast  MRI Brain Without Contrast   REASON FOR EXAM:    left arm weakness  COMMENTS:       PROCEDURE: MR  - MR BRAIN WO CONTRAST  - Aug 22 2014  8:32AM     CLINICAL DATA:  Bilateral arm weakness.    EXAM:  MRI HEAD WITHOUT CONTRAST    TECHNIQUE:  Multiplanar, multiecho pulse sequences of the brain and surrounding  structures were obtained without intravenous contrast.    COMPARISON:  Head CT 08/20/2014  FINDINGS:  The brainstem is normal. There are multiple old small vessel  cerebellar infarctions bilaterally. Flow does appear to be present  within both vertebral arteries and the basilar artery.    With respect to the cerebral hemispheres, there is a background  pattern of generalized atrophy with old cortical and subcortical  infarctions affecting both temporoparietal junctionregions and both  frontal regions. There is acute infarction affecting the gyri at the  right frontoparietal junction area at the vertex. No swelling or  hemorrhage. There is a punctate focus of acute infarction affecting  the left insula. No evidence of mass lesion, acute hemorrhage,  hydrocephalus or extra-axial collection. No pituitary mass. No  inflammatory sinus disease. No skull or skullbase lesion.   IMPRESSION:  Region of acute infarction affecting the gyral surfaces at the right  fronto parietal vertex consistent with right MCA branch vessel  infarction.    Punctate focus of acute infarction along the gyral surface of the  left insula.    Extensive old infarctions seen throughout the cerebellum, both  temporoparietal junction regions, and both posterior frontal  regions.      Electronically Signed    By: Nelson Chimes M.D.    On: 08/22/2014 08:57         Verified By: Jules Schick, M.D.,    14-Sep-15 08:58, MRA Brain Without Contrast  MRA Brain Without Contrast   REASON FOR EXAM:    left arm weakness  COMMENTS:       PROCEDURE: MR  - MRA BRAIN WO CONTRAST  - Aug 22 2014  8:58AM     CLINICAL DATA:  Bilateral arm weakness    EXAM:  MRA HEAD WITHOUT CONTRAST    TECHNIQUE:  Angiographic images of the Circle of Willis were obtained using MRA  technique without intravenous contrast.    COMPARISON:  MRI same day  FINDINGS:  Both internal carotid arteries are widely patent through the  skullbase. The anterior and middle cerebral arteries are patent  proximally without proximal stenosis, aneurysm or vascular  malformation. More distal branch vessels do show atherosclerotic  irregularity, most notable in the right MCA branches.    The right vertebral artery is a large vessel patent to the basilar.  The left vertebral artery appears to terminate in PICA. There could  be minimal flow in the distal vertebral artery to the basilar. No  basilar stenosis. Both PICA show flow. There is flow in both  superior cerebellar and posterior cerebral arteries, though the  distal branch vessels  show atherosclerotic irregularity and  narrowing. Left PCA receives its supply from the anterior  circulation primarily.   IMPRESSION:  Atherosclerotic irregularity of the distal branch vessels diffusely.    In the anterior circulation, there is no dominant or correctable  proximal stenosis.    In the posterior circulation, there is diminished flow in the distal  left vertebral artery, which may have either slow flow or may be  occluded distal to PICA.      Electronically Signed    By: Nelson Chimes M.D.    On: 08/22/2014 09:20     Verified By: Jules Schick, M.D.,    14-Sep-15 09:43, MRI Cervical Spine WWO  MRI Cervical Spine WWO   REASON FOR EXAM:    neck pain with L sided weakness  COMMENTS:       PROCEDURE: MR  - MR CERVICAL SPINE WO/W  - Aug 22 2014  9:43AM     CLINICAL DATA:  Neck pain with bilateral arm weakness, worse on the  left.    EXAM:  MRI CERVICAL SPINE WITHOUT AND WITH CONTRAST    TECHNIQUE:  Multiplanar and multiecho pulse sequences of  the cervical spine, to  include the craniocervical junction and cervicothoracic junction,  were obtained according to standard protocol without and with  intravenous contrast.    CONTRAST:  17 ml MultiHance.    COMPARISON:  Cervical spine CT 08/20/2014.    FINDINGS:  The alignment is stable with a mild scoliosis. There is no evidence  of acute fracture or paraspinal ligamentous injury.    The craniocervical junction appears normal. The cervical cord is  normal in signal and caliber. There are bilateral vertebral artery  flow voids. Post-contrast images demonstrate no abnormal intradural  enhancement.  C2-3: Minimal facet hypertrophy. No spinal stenosis or nerve root  encroachment.    C3-4: Mild uncinate spurring bilaterally contributes to mild  biforaminal stenosis. No cord deformity.    C4-5: There is spondylosis with posterior osteophytes and bilateral  uncinate spurring. No cord deformity results. However, thereis  moderate foraminal narrowing, worse on the left.    C5-6: There is spondylosis with asymmetric uncinate spurring on the  left. Mild facet hypertrophy is present bilaterally. There is severe  left foraminal stenosis with probable left C6 nerve root  encroachment. The right foramen is moderately narrowed. There is no  cord deformity.  C6-7: Asymmetric uncinate spurring is present on the left at this  level as well, contributing to moderate left foraminal stenosis and  possible left C7 nerve root encroachment. The right foramen is  patent. There is no cord deformity.    C7-T1: Mild bilateral facet hypertrophy. No significant spinal  stenosis or nerve root encroachment.     IMPRESSION:  1. Asymmetric uncinate spurring on the left contributes to left  foraminal stenosis which appears most advanced at C5-6 where it is  severe. There is probable left C6 nerve root encroachment.  2. Moderate asymmetric left foraminal stenosis is also present at  C4-5 and C6-7.  3.  No cord deformity.  Electronically Signed    By: Camie Patience M.D.    On: 08/22/2014 09:56         Verified By: Vivia Ewing, M.D.,    14-Sep-15 10:07, MRA Neck (Carotids) With Contrast  MRA Neck (Carotids) With Contrast   REASON FOR EXAM:    stroke  COMMENTS:       PROCEDURE: MR  - MRA NECK CAROTIDS W/CONTRAST  -  Aug 22 2014 10:07AM     CLINICAL DATA:  Bilateral arm weakness.    EXAM:  MRI HEAD AND MRA NECK WITH CONTRAST    TECHNIQUE:  Multiplanar and multiecho pulse sequences of the neck were obtained  with intravenous contrast. Angiographic images of the neck were  obtained using MRA technique with intravenous contast.  CONTRAST:  17 cc MultiHance    COMPARISON:  Same day    FINDINGS:  Branching pattern of the brachiocephalic vessels from the arch is  normal. No origin stenoses.    Both common carotid arteries are widely patent to the bifurcation  regions. Both carotid bifurcations are widely patent without  narrowing or irregularity. Both cervical internal carotid arteries  appear normal.    Both vertebral artery origins are patent with the right vertebral  artery being dominant. The right vertebral artery is widely patent  to the basilar. The left vertebral artery largely terminates in  PICA, with veryminimal flow demonstrable beyond that to the  basilar.     IMPRESSION:  No significant finding in the neck. Both carotid bifurcation regions  are widely patent. Therefore, emboli from the heart are most likely  in this case.    Left vertebral artery largely terminates in PICA, with minimal  detectable flow beyond that to the basilar.      Electronically Signed    By: Nelson Chimes M.D.    On: 08/22/2014 10:29         Verified By: Jules Schick, M.D.,  Cardiology:    12-Sep-15 15:44, ECG  Ventricular Rate 109  Atrial Rate 109  P-R Interval 126  QRS Duration 88  QT 354  QTc 476  P Axis 51  R Axis 29  T Axis 48  ECG interpretation   Sinus  tachycardia with occasional Premature ventricular complexes  Otherwise normal ECG  When compared with ECG of 16-Jun-2014 14:18,  Premature ventricular complexes are now Present  Minimal criteria for Inferior infarct are no longer Present  ----------unconfirmed----------  Confirmed by OVERREAD, NOT ([id number]), editor PEARSON, BARBARA ([id number]) on 08/22/2014 9:14:31 AM  ECG     13-Sep-15 06:13, Echo Doppler  Echo Doppler   REASON FOR EXAM:      COMMENTS:       PROCEDURE: [place] - ECHO DOPPLER COMPLETE(TRANSTHOR)  - Aug 21 2014  6:13AM     RESULT: Echocardiogram Report    Patient Name:   [name] Date of Exam: 08/21/2014  Medical Rec #:  [id number]           Custom1:  Date of Birth:  [birth year]         Height:       71.0 in  Patient Age:    53 years         Weight:       181.0 lb  Patient Gender: M                BSA:          2.02 m??    Indications: CVA  Sonographer:    LTM  Referring Phys: Dustin Flock, H    Summary:   1. Left ventricular ejection fraction, by visual estimation, is 35 to   40%.   2. Moderately decreased global left ventricular systolic function.   3. Moderate to severe mitral valve regurgitation.   4. Mildly increased left ventricular internal cavity size.  2D AND M-MODE MEASUREMENTS (normal ranges within parentheses):  Left Ventricle:  Normal   AoV Cusp Separation: 1.70 cm (1.5-2.6)  IVSd (2D):      1.01 cm (0.7-1.1)  LVPWd (2D):     1.28 cm (0.7-1.1) Aorta/LA:                  Normal  LVIDd (2D):     5.62 cm (3.4-5.7) Aortic Root (2D): 3.40 cm (2.4-3.7)  LVIDs (2D):     4.63 cm           AoV Cusp Exc:     1.70 cm (1.5-2.6)  LV FS (2D):     17.6 %   (>25%)   Left Atrium (2D): 3.50 cm (1.9-4.0)  LV EF (2D):     36.2 %   (>50%)                                      Right Ventricle:                                    RVd (2D):  LV DIASTOLIC FUNCTION:  MV Peak E: 1.16 m/s Decel Time: 172 msec  MV Peak A: 0.32 m/s  E/A Ratio: 3.67  SPECTRAL DOPPLER ANALYSIS  (where applicable):  Mitral Valve:  MV Max Vel:   1.64 m/s MV P1/2 Time: 49.88 msec  MV Mean Grad: 4.5 mmHg MV Area, PHT: 4.41 cm??  Aortic Valve: AoV Max Vel: 1.09 m/s AoV Peak PG: 4.8 mmHg AoV Mean PG:  LVOT Vmax: 0.71 m/s LVOT VTI: 0.124 m LVOT Diameter: 2.20 cm  AoV Area, Vmax: 2.47 cm?? AoV Area, VTI:  AoV Area, Vmn:  Tricuspid Valve and PA/RV Systolic Pressure: TR Max Velocity: 2.51 m/s RA   Pressure: 10 mmHg RVSP/PASP: 35.3 mmHg  Pulmonic Valve:  PV Max Velocity: 0.74 m/s PV Max PG: 2.2 mmHg PV Mean PG:    PHYSICIAN INTERPRETATION:  Left Ventricle: The left ventricular internal cavity size was mildly   increased. LV septal wall thickness was normal. LV posterior wall   thickness was normal. Global LV systolic function was moderately   decreased. Left ventricular ejection fraction, by visual estimation, is   35 to 40%.  Right Ventricle: The right ventricular size is normal.  Left Atrium: The left atrium is normal in size.  Right Atrium: The right atrium is normal in size.  Mitral Valve: The mitral valve is normal in structure. Moderate to severe     mitral valve regurgitation is seen.  Tricuspid Valve: The tricuspid valve is not well seen. Trivial tricuspid   regurgitation is visualized. The tricuspid regurgitant velocity is 2.51   m/s, and with an assumed right atrial pressure of 10 mmHg, the estimated   right ventricular systolic pressure is normal at 35.3 mmHg.  Aortic Valve: The aortic valve is tricuspid. The aortic valve is   structurally normal, with no evidence of sclerosis or stenosis. Trivial   aortic valve regurgitation is seen.    [place] MD  Electronically signed by [id number] Bartholome Bill MD  Signature Date/Time: 08/22/2014/6:28:06 AM    *** Final ***  IMPRESSION: .        Verified By: Teodoro Spray, M.D., MD  CT:    12-Sep-15 16:27, CT Head Without Contrast  CT Head Without Contrast   REASON FOR EXAM:    numbness and weakness to left  arm since  0800  COMMENTS:       PROCEDURE: CT  - CT HEAD WITHOUT CONTRAST  - Aug 20 2014  4:27PM     CLINICAL DATA:  54 year old male with left arm numbness and  weakness.    EXAM:  CT HEAD WITHOUT CONTRAST    TECHNIQUE:  Contiguous axial images were obtained from the base of the skull  through the vertex without intravenous contrast.  COMPARISON:  None.    FINDINGS:  Bilateral posterior parietal infarcts are identified and appear  acute to subacute.    A small age indeterminate infarct within the left frontal lobe  noted.    Mild probable chronic small-vessel white matter ischemic changes  noted.    There is no evidence of hemorrhage, hydrocephalus, midline shift,  mass lesion or extra-axial collection.  The visualized bony calvarium is unremarkable.     IMPRESSION:  Age indeterminate bilateral posterior parietal and left frontal  infarcts -probably acute to subacute. No evidence of hemorrhage.    Mild probable chronic small-vessel white matter ischemic changes.      Electronically Signed    By: Hassan Rowan M.D.    On: 08/20/2014 16:41         Verified By: Lura Em, M.D.,    12-Sep-15 17:23, CT Cervical Spine Without Contrast  CT Cervical Spine Without Contrast   REASON FOR EXAM:    left hand weakness  COMMENTS:       PROCEDURE: CT  - CT CERVICAL SPINE WO  - Aug 20 2014  5:23PM     CLINICAL DATA:  Left arm weakness.    EXAM:  CT CERVICAL SPINE WITHOUT CONTRAST    TECHNIQUE:  Multidetector CT imaging of the cervical spine was performed without  intravenous contrast. Multiplanar CT image reconstructions were also  generated.  COMPARISON:  None.    FINDINGS:  There is straightening of normal cervical lordosis. This may reflect  muscle spasm or patient positioning. Multi level disc space  narrowing and ventral endplate spurring is noted compatible with  degenerative disc disease. Facet joints appear well-aligned. There  is no fracture or subluxation identified.  Emphysematous changes are  identified withinthe lung apices.     IMPRESSION:  1. No acute findings.  2. Cervical spondylosis noted.  3. Emphysema.  Electronically Signed    By: Kerby Moors M.D.    On: 08/20/2014 17:33         Verified By: Angelita Ingles, M.D.,   Assessment/Plan:  Assessment/Plan:  Assessment IMP  CVA  hemi pareses  weakness  hypertension  ulcerative colitis  anemia  noncompliance .   Plan PLAN  the patient refuses TEE  recommend anticoagulation with aspirin  blood pressure control  statin therapy for hyperlipidemia  continue treatment for ulcer colitis  I would recommend Neurology input  recommend physical and occupational therapy  consider rehab the patient refused to go  have the patient follow-up with Cardiology as an outpatient   Electronic Signatures: Yolonda Kida (MD)  (Signed 18-Sep-15 17:38)  Authored: Chief Complaint, VITAL SIGNS/ANCILLARY NOTES, Brief Assessment, Lab Results, Radiology Results, Assessment/Plan   Last Updated: 18-Sep-15 17:38 by Yolonda Kida (MD)

## 2015-04-01 NOTE — H&P (Signed)
PATIENT NAME:  [name], [name] MR#:  [id number] DATE OF BIRTH:  05/09/1961  DATE OF ADMISSION:  08/20/2014  PRIMARY CARE PROVIDER: [facility].  EMERGENCY DEPARTMENT REFERRING PHYSICIAN: Sharyn Creamer, MD.  CHIEF COMPLAINT: Left arm weakness and numbness.   HISTORY OF PRESENT ILLNESS: The patient is a 54 year old white male with a history of ulcerative colitis, a history of anemia, a history of chronic mild elevation of troponin, previous history of CVA, who has chronic right arm weakness. He reports that he has been feeling weak for the past 1 week and then earlier this morning around 8:00 a.m., he started noticing that his left arm was very weak and he had difficulty with moving the arm. He also reported that his speech felt like it was a little off." The patient came to the ED and underwent evaluation and noted to have a CT scan of the head, which showed bilateral parietal and left frontal infarcts felt to be acute to subacute. The patient also was noted to have a hemoglobin that was low at 7.4.   He does have a history of anemia in the past and his hemoglobin last noted here was 8.0 in July 2009. The patient otherwise denies any chest pains, shortness of breath. No fevers or chills. Complains of weakness. Denies any blood in his stool. Reports that his ulcerative colitis is under control.   PAST MEDICAL HISTORY: Ulcerative colitis, history of previous CVA with right upper extremity weakness, history of anemia, chronic mild elevation in troponin felt to be due to demand ischemia.   SOCIAL HISTORY: Does not smoke. No alcohol. No drugs. Lives at home.   FAMILY HISTORY: Diabetes and emphysema in the father.   ALLERGIES: PENICILLIN, WHICH CAUSES HIVES.  MEDICATIONS: The only medication he takes is Asacol 400 mg 4 capsules 3 times a day.   REVIEW OF SYSTEMS:   CONSTITUTIONAL: Denies any fevers. Complains of fatigue and weakness. No pain. No weight loss. No weight gain.  EYES: No blurred or double  vision. No redness. No inflammation. No glaucoma. No cataracts.  ENT: No tinnitus. No ear pain. No hearing loss. No difficulty swallowing.  RESPIRATORY: Denies any cough, wheezing, hemoptysis. No COPD.  CARDIOVASCULAR: Denies any chest pain, orthopnea, edema.  GENITOURINARY: No nausea, vomiting, diarrhea. No abdominal pain. No hematemesis. No melena. Has a history of ulcerative colitis.  GENITOURINARY: Denies any dysuria, hematuria, renal colic or frequency.  ENDOCRINE: Denies any polyuria, nocturia or thyroid problems.  HEMATOLOGIC/LYMPHATIC: Has a history of anemia but no easy bruisability or bleeding.  SKIN: No acne. No rash.  MUSCULOSKELETAL: No pain in the neck, back or shoulders.  NEUROLOGIC: Has chronic right arm weakness and now has left arm weakness. Previous history of CVA. No seizures.  PSYCHIATRIC: No anxiety, insomnia, ADD or OCD.   PHYSICAL EXAMINATION: VITAL SIGNS: Temperature 98.2, pulse 94, respirations 23, blood pressure 134/91.  GENERAL: The patient is a well-developed male in no acute distress.  HEENT: Head atraumatic, normocephalic equally round, reactive to light and accommodation. There is conjunctival pallor. There is no scleral icterus. Extraocular movements intact. Nasal exam shows no drainage or ulceration. External ear exam shows no erythema or drainage.  NECK: Supple without any JVD. No thyromegaly. There are no carotid bruits.  CARDIOVASCULAR: Regular rate and rhythm. No murmurs, rubs, clicks or gallops. The PMI is not displaced.  LUNGS: Clear to auscultation bilaterally without any rales, rhonchi, wheezing.  ABDOMEN: Soft, nontender, nondistended. Positive bowel sounds x 4.  EXTREMITIES: No clubbing,  cyanosis or edema.  SKIN: No rash.  LYMPHATICS: No lymph nodes palpable.  VASCULAR: Good DP, PT pulses.  PSYCHIATRIC: Not anxious or depressed.  NEUROLOGIC: Awake, alert, oriented x 3. Cranial nerves 2 through 12 are grossly intact. His strength in right upper  extremity is 4/5, left upper extremity is 3/5. Lower extremity strength is 5/5. Reflexes 2+. Babinski is downgoing. Sensation is intact.   LABORATORY DATA: WBC count 6.7, hemoglobin 7.4, platelet count 236,000. MCV of 62. Glucose 167. BUN 9, creatinine 1.24, sodium 138, potassium 3.4, chloride 108, CO2 is 20, calcium 8.1, troponin 0.16.   DIAGNOSTIC DATA: EKG normal sinus rhythm without any ST-T wave changes. CT scan of the head shows findings consistent with  age-indeterminate bilateral posterior parietal and left frontal infarct. Chest x-ray PA and lateral, no evidence of cardiopulmonary processes.   ASSESSMENT AND PLAN: The patient is a 54 year old, white male with history of ulcerative colitis, previous history of cerebrovascular accident affecting his right arm who presents with left arm weakness, noted to have bilateral cerebrovascular accident.  1. Bilateral cerebrovascular accident, acute versus subacute. At this time, I am going to start him on a low-dose aspirin. The patient's hemoglobin is low. He has a history of chronic anemia, now worse, so we will also transfuse him. We will get an MRI of the brain, MRA of the brain. Neurologic evaluation. Check a fasting lipid panel. Start him on pravastatin. We will get a  physical therapy evaluation. If MRA is negative, then he may need a transesophageal echocardiogram.  2. Ulcerative colitis. Continue Asacol as he was taking at home.  3. Anemia with acute cerebrovascular accident. I have consented the patient for transfusion of 1 unit of packed red blood cells. We will guaiac his stool. The patient is agreeable to transfusion. Risks and benefits explained.  4. Elevated troponin, likely due to demand ischemia. As previous, we will cycle troponins. He will have an echocardiogram. We will look for any significant wall motion abnormalities on that. If the patient has symptoms, he will need further cardiology evaluation.  5. Miscellaneous. The patient will  be on sequential compression devices in light of his significant anemia.   TIME SPENT: 55 minutes on this patient.     ____________________________ Lacie ScottsShreyang H. Allena KatzPatel, MD shp:TT D: 08/20/2014 19:40:17 ET T: 08/20/2014 20:00:24 ET JOB#: [id number]  cc: Benard Minturn H. Allena KatzPatel, MD, <Dictator> Charise CarwinSHREYANG H Carrington Mullenax MD ELECTRONICALLY SIGNED 08/26/2014 8:41

## 2015-04-01 NOTE — Consult Note (Signed)
PATIENT NAME:  [name], [name] MR#:  [id number] DATE OF BIRTH:  1961-02-03  DATE OF CONSULTATION:  08/22/2014  REFERRING PHYSICIAN:    Auburn BilberryShreyang Patel, MD  CONSULTING PHYSICIAN:  Retia Cordle [name], MD   INDICATION: Cerebrovascular accident, source of emboli.   HISTORY OF PRESENT ILLNESS: The patient is a 54 year old white male with history of ulcerative colitis, anemia, colonic mild elevation of troponin, previous history of CVA who has right-sided weakness but presents with bilateral weakness over the past week.  In the morning, he noticed his left arm started to feel weak and difficulty moving it.  He also had some trouble with his speech.  The patient finally asked his daughter to bring him to the Emergency Room.  CT scan of the head showed bilateral influx, acute and subacute.  He also was found to have a low hemoglobin so he was admitted for further evaluation and care.  He has had hemoglobin that is chronically low in the past.  Denies any bleeding.   REVIEW OF SYSTEMS:  Denies blackout spells or syncope.  No nausea or vomiting.  No fever, no chills, no sweats.  Denies weight loss, weight gain.  No hemoptysis or hematemesis.  No bright red blood per rectum.  Complains of bilateral arm weakness, , trouble with slurred speech, weakness and fatigue.   PAST MEDICAL HISTORY: Ulcerative colitis, CVA, anemia, elevated troponin, possible demand ischemia in the past.   SOCIAL HISTORY:  Disabled, lives at home with his dad.  No smoking or alcohol consumption.   FAMILY HISTORY: Diabetes, emphysema.  ALLERGIES: PENICILLIN.     MEDICATIONS: Asacol 400 at 4 capsules 3 times a day.    PHYSICAL EXAMINATION:  VITAL SIGNS: Blood pressure 130/90, pulse 90, respiratory rate 16, afebrile.  HEENT: Normocephalic, atraumatic. Pupils equal and reactive to light.  NECK: Supple with no significant JVD, bruits or adenopathy.  LUNGS: Clear to auscultation and percussion. No significant wheezing, rhonchi, or  rale.  HEART: Regular rate and rhythm. No significant murmur, gallops, or rubs.   ABDOMEN: Benign.  EXTREMITIES: Within normal limits.  NEUROLOGIC: Bilateral arm weakness, light slurred speech.  SKIN: Normal.   LABORATORY AND DIAGNOSTICS:  White count 6.7, hemoglobin 7.4, platelet count of 236,000.  MCV 62, glucose 167, BUN 9, creatinine 1.24, sodium 138, potassium 3.4, chloride 108, CO2 of 20, calcium 8.1. Troponin 0.16.   EKG is normal sinus rhythm, nonspecific ST-T changes. Chest x-ray unremarkable. CT of the head shows bilateral posterior parietal left frontal infarcts, acute and subacute.   ASSESSMENT:  1.  Bilateral cerebrovascular accident. 2.  Ulcerative colitis. 3.  Anemia.  4.  Chronically elevated troponins. 5.  Slurred speech.  6.  Possible demand ischemia.   PLAN:  Agree with admit.  I agree with neurology evaluation.  Agree with CT.  The patient may need an MRI evaluation, may need anticoagulation.  I would recommend echocardiogram for source of emboli.  Must also consider infectious endocarditis but there is no evidence of infection or fever.   I would recommend work-up for anemia possibly related to ulcerative colitis.  He may have a slow bleed.  May need transfusion.   Continue neurology evaluation and treatment.  Consider telemetry and a Holter.  There is no evidence of atrial fibrillation or arrhythmia, but this may also be a potential cause.  We will proceed with transesophageal echocardiogram for evaluation of source of emboli, continue therapy in the meantime with aspirin and wait for neurology's recommendation about anticoagulation.  ____________________________ Bobbie Stack Juliann Pares, MD ddc:DT Rosario: 08/23/2014 08:32:08 ET T: 08/23/2014 08:51:02 ET JOB#: [id number]  cc: Sorayah Schrodt Rosario. Juliann Pares, MD, <Dictator> Alwyn Pea MD ELECTRONICALLY SIGNED 09/08/2014 12:39

## 2015-04-01 NOTE — Discharge Summary (Signed)
PATIENT NAME:  [name], [name] MR#:  [id number] DATE OF BIRTH:  11/23/1961  DISCHARGE DIAGNOSES: 1.  Acute bilateral cerebrovascular accident.  2.  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  3.  Anemia of chronic disease and iron deficiency.  4.  Ulcerative colitis. 5.  Chronic systolic congestive heart failure.  6.  Mildly elevated troponin, which is chronic.   IMAGING STUDIES: Include: 1.  An echocardiogram which showed ejection fraction of 35% to 40%, moderately decreased global left ventricular systolic function, moderate to severe mitral valve regurgitation.  2.  CT scan of the head without contrast showed prior strokes bilaterally and subacute to acute bilateral parietal stroke.  3.  CT cervical spine without contrast showed no acute findings. Cervical spondylosis and emphysema.  4.  MRI of the brain along with MRA and MRI of the cervical spine showed extensive old infarction seen throughout the cerebellum, both temporoparietal junction regions and both posterior frontal regions.  Punctate focus of acute infarction along the gyral surface of the left insula at region of acute infarction affecting the gyral surface at right frontoparietal vertex consistent with right MCA branch basilar infarction.  5.  MRI of the cervical spine showed significant spinal stenosis with encroachment onto the C5 nerve. 6.  An MRA of the carotids showed no significant stenosis.   CONSULTS:  1.  Troy Sine Katrinka Blazing, MD, with neurology.  2.  Dwayne D. Juliann Pares, MD, with cardiology.   PROCEDURES: TEE was planned. The patient initially agreed, but later refused adamantly to have a TEE done.   ADMITTING HISTORY AND PHYSICAL: Please see detailed H and P dictated previously by Dr. Allena Katz. In brief, a 54 year old male patient with history of chronic right upper extremity weakness, presented to the hospital complaining of left arm weakness. The patient was admitted to hospitalist service for further  workup.   HOSPITAL COURSE: 1.  Acute bilateral CVA. The patient had an MRI of the brain done which showed old bilateral strokes along with acute bilateral CVA. The patient was thought to be a candidate to get a TEE for further evaluation after his transthoracic echocardiogram showed no significant abnormalities other than a low ejection fraction, but the patient has refused to get a TEE.  The patient is not oriented to date or place.  On day of discharge, I discussed with his next of kin, his father, who tried to reason with him, but finally they decided against getting a TEE in the hospital. The patient did not need any further investigations. Had mild elevation in troponin which is thought to be secondary to his acute stroke or chronic systolic CHF. He does have chronic elevation in troponin. He does not have any chest pain, shortness of breath.  2.  Iron deficiency anemia, along with anemia of chronic disease. The patient had 2 units of blood transfusion done in the hospital. Has been started on iron supplementation and his hemoglobin is stable.  3.  Prior to discharge, the patient has 5-/5 motor strength in right upper extremity and 4/5 in left upper extremity secondary to his strokes.   The patient also had an MRI of the cervical spine, which showed cervical spinal stenosis with left C6 nerve root encroachment for which he will follow up with [facility] in [place].   DISCHARGE MEDICATIONS:  1.  Mesalamine 400 mg 4 capsules 3 times a day.  2.  Atorvastatin 20 mg daily.  3.  Aspirin 81 mg  daily. 4.  Plavix 75 mg daily.  5.  Ferrous sulfate 325 mg oral 3 times a day.  6.  Metoprolol succinate 25 mg oral once a day.   DISCHARGE INSTRUCTIONS: Home health with PT. Follow up with Dr. Juliann Paresallwood in 1 to 2 days for an event monitor for 60 days, with Dr. Sherryll BurgerShah or Dr. Malvin JohnsPotter of neurology in 2 to 4 weeks [facility] stenosis. He will  be on low fat diet. Activity as tolerated.   Time spent on day of discharge in discharge activity was 40 minutes.    ____________________________ Molinda BailiffSrikar R. Carlisia Geno, MD srs:LT D: 08/23/2014 16:03:48 ET T: 08/23/2014 16:19:13 ET JOB#: [id number]  cc: Wardell HeathSrikar R. Cam Dauphin, MD, <Dictator> Orie FishermanSRIKAR R Krystiana Fornes MD ELECTRONICALLY SIGNED 08/27/2014 14:32

## 2015-04-01 NOTE — H&P (Signed)
PATIENT NAME:  [name], [name]#:  [id number] DATE OF BIRTH:  Oct 16, 1961  DATE OF ADMISSION:  04/26/2014  PRIMARY CARE PROVIDER: The patient does not remember the name, but the first name is [name] according to the patient.   CHIEF COMPLAINT: Hypotension, dizziness.   HISTORY OF PRESENT ILLNESS: This 54 year old Caucasian male patient with history of ulcerative colitis presents to the Emergency Room sent in by primary care physician for hypotension. Blood pressure at his doctor's office is unknown, but here the patient's blood pressure was at 98/58, and he mentioned that his blood pressure from what he remembers was in the 50s. He did feel dizzy. He mentioned that he has had some bleeding in his stool, very minimal, over the last 2 weeks. No frank bleeding. No nausea, vomiting, abdominal pain. He has been taking his Delzicol for his ulcerative colitis and follows up with [facility].   The patient recently was at the office applying for disability. He was asked to follow up with his primary care physician's office, which made him go to the office. No recent change in medications. Does not take any NSAIDs, steroids, or blood thinners.   The patient does have elevated troponin at 0.06. EKG showing nothing acute. No chest pain, shortness of breath, orthopnea, edema.   PAST MEDICAL HISTORY: Ulcerative colitis and also chronic mild elevation of troponin.  SOCIAL HISTORY: The patient does not smoke. No alcohol. No illicit drugs. Lives at home with his dad.   CODE STATUS: Full code.   FAMILY HISTORY: Diabetes and emphysema in his father. No ulcerative colitis in the family.   ALLERGIES: PENICILLIN, WHICH CAUSES HIVES.   HOME MEDICATIONS: Delzicol of unknown dose.  REVIEW OF SYSTEMS:   CONSTITUTIONAL: Complains of fatigue. No weight loss, weight gain.  EYES: No blurred vision, pain, redness. ENT: No tinnitus, ear pain, hearing loss.  RESPIRATORY: No cough, wheeze, hemoptysis.  CARDIOVASCULAR: No  chest pain, orthopnea, edema.  GASTROINTESTINAL: No nausea, vomiting. Does have chronic diarrhea, bloody stools for 2 weeks.  GENITOURINARY: No dysuria, hematuria, frequency.  ENDOCRINE: No polyuria, nocturia, thyroid problems.  HEMATOLOGIC AND LYMPHATIC: Has bleeding. Has anemia. No bruising.  INTEGUMENTARY: No acne, rash, lesion.  MUSCULOSKELETAL: Has right shoulder pain, which is chronic.  NEUROLOGIC: No focal numbness, weakness, seizure.  PSYCHIATRIC: No anxiety, depression.   PHYSICAL EXAMINATION: VITAL SIGNS: Temperature 97.6, pulse 91, blood pressure of 98/58, saturating 100% on room air. Orthostatics are negative.  GENERAL: Moderately built Caucasian male patient lying in bed, seems comfortable, conversational, cooperative with exam.  PSYCHIATRIC: He is alert and oriented x 3. Mood and affect appropriate. Judgment intact.  HEENT: Atraumatic, normocephalic. Oral mucosa moist and pink. External ears and nose normal. Pallor positive. No icterus. Pupils bilaterally equal and reactive to light.  NECK: Supple. No thyromegaly or palpable lymph nodes. Trachea midline. No carotid bruit, JVD.  CARDIOVASCULAR: S1, S2, without any murmurs. Peripheral pulses 2+. No edema.  RESPIRATORY: Normal work of breathing. Clear to auscultation on both sides.  GASTROINTESTINAL: Soft abdomen. Tenderness in the right and left lower quadrants on deep palpation. No rigidity or guarding. Bowel sounds are present. No hepatosplenomegaly palpable.  SKIN: Warm and dry. No petechiae, rash, ulcers.  MUSCULOSKELETAL: No joint swelling, redness, effusion of large joints. Normal muscle tone.  NEUROLOGICAL: Motor strength 5/5 in upper and lower extremities. Sensation to fine touch intact all over.  LYMPHATIC: No cervical lymphadenopathy.  GENITOURINARY: No CVA tenderness or bladder distention.   LABORATORY, DIAGNOSTIC, AND RADIOLOGICAL DATA:  Show glucose of 188, BUN 10, creatinine 1.12, sodium 134, potassium 3.6, chloride  102. GFR greater than 60. AST, ALT, alkaline phosphatase, bilirubin normal. Troponin 0.06.   WBC 9.3, hemoglobin 9, platelets of 204, MCV 65.   EKG shows normal sinus rhythm. No acute ST-T wave changes.   Chest x-ray, portable, shows no evidence of acute cardiopulmonary disease.   ASSESSMENT AND PLAN: 1.  Anemia with bright red blood per rectum and dizziness. The patient's last known hemoglobin was 13, but this was 9 months prior. Presently he is at 9. His MCV is 65. I suspect the patient does have chronic GI blood loss, which has worsened over the last 2 weeks causing his symptoms. He has been taking his Delzicol, has been compliant with medications. Not on any blood thinners or NSAIDs. Will consult GI for further input. Presently will check hemoglobin again in 6 hours. IV fluid bolus for his hypotension and dizziness. Check iron studies. Blood transfusion if hemoglobin is less than 7 or any acute bleed. Await further GI input regarding steroid use.  2.  Elevated troponin, very minimal at 0.06. The patient has had chronic elevation in troponin up to a point of 5.6. Last time admission, he was 0.11. I do not think this is nstemi2. Will check 2 more sets of cardiac enzymes. He does not have any chest pain, shortness of breath. Had an echo during last admission, which was normal. No aspirin due to the gastrointestinal bleed. No other risk factors.  3.  Acute blood loss anemia. See above.  4.  Hypoalbuminemia secondary to chronic disease of ulcerative colitis.  5.  Mild hyponatremia, asymptomatic. Start IV fluids and repeat in the morning.  6.  Deep venous thrombosis prophylaxis with sequential compression devices.   CODE STATUS: Full code.   TIME SPENT TODAY ON THIS CASE: 40 minutes.    ____________________________ Molinda Bailiff Tomica Arseneault, MD srs:jcm D: 04/26/2014 12:39:26 ET T: 04/26/2014 13:12:19 ET JOB#: [id number]  cc: Wardell Heath R. Sheyli Horwitz, MD, <Dictator> Orie Fisherman MD ELECTRONICALLY SIGNED  05/06/2014 14:14

## 2015-04-01 NOTE — Discharge Summary (Signed)
PATIENT NAME:  [name], [name] MR#:  [id number] DATE OF BIRTH:  10/17/1961  DATE OF ADMISSION:  04/26/2014 DATE OF DISCHARGE:  04/27/2014  Discharge Diagnosis:  1.  Anemia: likely acute on chronic blood loss. now with bright red blood per rectum and dizziness. The patient's last known hemoglobin was 13, but this was 9 months prior. Presently he is at 9. His MCV is 65. I suspect the patient does have chronic GI blood loss, which has worsened over the last 2 weeks causing his symptoms. he is very adamant in wanting to go home. he is aware of risks of leaving hospital including further severe bleeding and death. 2.  Elevated troponin: due to supply demand ischemia due to anemia. 3.  Mild hyponatremia: resolved with hydration.  SECONDARY DIAGNOSIS:  1. Ulcerative Colitis  HISTORY AND HOSPITAL COURSE: The patient is a 54 year old male with the above-mentioned medical problems, who was admitted for hypotension, dizziness and anemia, thought to be acute on chronic blood loss. Please see Dr. Eddie NorthSudini's history and physical for further details. The patient's hemoglobin did not significantly drop, and had a mild troponin elevation which was thought to be chronic and noncardiac related. The patient was very adamant on the 20th May to just go home, as he had an important appointment coming up for his shoulder problem. If we don't discharge he would have left against medical advice. After looking at his hemodynamics, he was felt to be stable for discharge.    DISCHARGE MEDICATIONS: Medication Instructions  mesalamine 400 mg oral delayed release capsule  4 cap(s) orally 3 times a day   pantoprazole 40 mg oral delayed release tablet  1 tab(s) orally 2 times a day    Discharge Diet: Regular  Discharge Activity: As Tolerated  Discharge Instructions/Follow up:  He will need f/up with his PCP in 1-2 weeks, with [facility] in 2-4 weeks, his Ortho in 1-2 days. He was instructed to avoid any NSAIDS. He remains at  very high risk for readmissions. he might even leave AMA if we don't discharge him, he is also eager to get disability.   ____________________________ Ellamae SiaVipul S. Sherryll BurgerShah, MD vss:cg Rosario: 04/29/2014 23:48:00 ET T: 04/30/2014 06:51:02 ET JOB#: [id number]  cc: Tavon Corriher S. Sherryll BurgerShah, MD, <Dictator> [facility] Ellamae SiaVIPUL S [facility] MD ELECTRONICALLY SIGNED 05/03/2014 21:08

## 2015-04-01 NOTE — Consult Note (Signed)
Referring Physician:  Alric Seton   Primary Care Physician:  Alric Seton : [facility], [address], [place], [place] [address], [place] [phone]  Reason for Consult: Admit Date: 21-Aug-2014  Chief Complaint: L arm weakness  Reason for Consult: CVA   History of Present Illness: History of Present Illness:   54 yo RHD M presents to [facility] secondary to sudden onset of L arm weakness and numbness.  Pt denies any leg weakness.  Pt has baseline of R sided weakness from previous stroke.  Pt denies headache but does denote neck pain for a long time.  Pt denies any vision changes.  Pt has never had L sided weakness before.  ROS:  General denies complaints   HEENT no complaints   Lungs no complaints   Cardiac no complaints   GI no complaints   GU no complaints   Musculoskeletal no complaints   Extremities no complaints   Skin no complaints   Neuro numbness/tingling   Endocrine no complaints   Psych no complaints   Past Medical/Surgical Hx:  Ulcerative Colitis:   Past Medical/ Surgical Hx:  Past Medical History CAD, stroke, ulcerative colitis   Past Surgical History none   Home Medications: Medication Instructions Last Modified Date/Time  mesalamine 400 mg oral delayed release capsule 4 cap(s) orally 3 times a day 12-Sep-15 19:47   Allergies:  PCN: Hives  Allergies:  Allergies PCN   Social/Family History: Employment Status: disabled  Lives With: alone  Living Arrangements: apartment  Social History: + tob, no EtOH, no illicits  Family History: no stroke, no CAD   Vital Signs: **Vital Signs.:   13-Sep-15 13:29  Vital Signs Type Routine  Temperature Temperature (F) 98.1  Celsius 36.7  Temperature Source oral  Pulse Pulse 101  Respirations Respirations 18  Systolic BP Systolic BP 431  Diastolic BP (mmHg) Diastolic BP (mmHg) 81  Mean BP 95  Pulse Ox % Pulse Ox % 98  Pulse Ox Activity Level  At rest  Oxygen  Delivery Room Air/ 21 %   Physical Exam: General: nl weight, NAD  HEENT: normocephalic, sclera nonicteric, oropharynx clear  Neck: supple, no JVD, no bruits  Chest: CTA B, no wheezing, good movement  Cardiac: RRR, no murmurs, no edema, 2+ pulses  Extremities: no C/C/E, FROM   Neurologic Exam: Mental Status: alert and oriented x 3, normal speech and language, follows complex commands  Cranial Nerves: PERRLA, EOMI, nl VF, face symmetric, tongue midline, shoulder shrug equal  Motor Exam: 4/5 B UE, 5 /5 B LE, increased tone in B UE, no tremor  Deep Tendon Reflexes: 3+/4 B UE, 4+/4 B LE, Babinksi B, + hoffmann B  Sensory Exam: pinprick, temperature, and vibration intact B  Coordination: FTN and HTS WNL   Lab Results: Thyroid:  13-Sep-15 04:00   Thyroid Stimulating Hormone 0.731 (0.45-4.50 (IU = International Unit)  ----------------------- Pregnant patients have  different reference  ranges for TSH:  - - - - - - - - - -  Pregnant, first trimetser:  0.36 - 2.50 uIU/mL)  LabObservation:  13-Sep-15 08:23   OBSERVATION Reason for Test  Routine BB:  12-Sep-15 19:29   ABO Group + Rh Type O Positive  Antibody Screen NEGATIVE (Result(s) reported on 20 Aug 2014 at 08:20PM.)  Crossmatch Unit 1 Transfused  Result(s) reported on 21 Aug 2014 at 05:52AM.  Routine Chem:  13-Sep-15 04:00   Result Comment TROPONIN - RESULTS VERIFIED BY REPEAT TESTING.  -  RESULTS PREVIOUSLY CALLED TO KIM GAULT  - AT 0165 ON 08/20/14 BY KBH...[place]  Result(s) reported on 21 Aug 2014 at 08:15AM.  Hemoglobin A1c [facility])  < 3.5 (The American Diabetes Association recommends that a primary goal of therapy should be <7% and that physicians should reevaluate the treatment regimen in patients with HbA1c values consistently >8%.)  Glucose, Serum  102  BUN 8  Creatinine (comp) 1.12  Sodium, Serum 139  Potassium, Serum  3.4  Chloride, Serum  109  CO2, Serum 26  Calcium (Total), Serum  8.1  Anion Gap  4  Osmolality  (calc) 276  eGFR (African American) >60  eGFR (Non-African American) >60 (eGFR values <10mL/min/1.73 m2 may be an indication of chronic kidney disease (CKD). Calculated eGFR is useful in patients with stable renal function. The eGFR calculation will not be reliable in acutely ill patients when serum creatinine is changing rapidly. It is not useful in  patients on dialysis. The eGFR calculation may not be applicable to patients at the low and high extremes of body sizes, pregnant women, and vegetarians.)  Cholesterol, Serum 65  Triglycerides, Serum 63  HDL (INHOUSE)  28  VLDL Cholesterol Calculated 13  LDL Cholesterol Calculated 24 (Result(s) reported on 21 Aug 2014 at 05:09AM.)  Cardiac:  13-Sep-15 04:00   Troponin I  0.17 (0.00-0.05 0.05 ng/mL or less: NEGATIVE  Repeat testing in 3-6 hrs  if clinically indicated. >0.05 ng/mL: POTENTIAL  MYOCARDIAL INJURY. Repeat  testing in 3-6 hrs if  clinically indicated. NOTE: An increase or decrease  of 30% or more on serial  testing suggests a  clinically important change)  Routine UA:  12-Sep-15 15:44   Color (UA) Yellow  Clarity (UA) Turbid  Glucose (UA) 50 mg/dL  Bilirubin (UA) Negative  Ketones (UA) Negative  Specific Gravity (UA) 1.027  Blood (UA) Negative  pH (UA) 5.0  Protein (UA) 30 mg/dL  Nitrite (UA) Negative  Leukocyte Esterase (UA) Negative (Result(s) reported on 20 Aug 2014 at 06:09PM.)  RBC (UA) 1 /HPF  WBC (UA) <1 /HPF  Bacteria (UA) NONE SEEN  Epithelial Cells (UA) NONE SEEN  Mucous (UA) PRESENT  Calcium Oxalate Crystal (UA) PRESENT (Result(s) reported on 20 Aug 2014 at 06:09PM.)  Routine Sero:  13-Sep-15 03:29   Occult Blood, Feces NEGATIVE (Result(s) reported on 21 Aug 2014 at 04:02AM.)  Routine Hem:  13-Sep-15 04:00   WBC (CBC) 5.2  RBC (CBC)  3.90  Hemoglobin (CBC)  7.4  Hematocrit (CBC)  24.9  Platelet Count (CBC) 186  MCV  64  MCH  18.9  MCHC  29.6  RDW  21.8  Neutrophil % 60.8  Lymphocyte %  18.9  Monocyte % 13.1  Eosinophil % 6.4  Basophil % 0.8  Neutrophil # 3.2  Lymphocyte # 1.0  Monocyte # 0.7  Eosinophil # 0.3  Basophil # 0.0 (Result(s) reported on 21 Aug 2014 at 04:52AM.)   Radiology Results: [place]:    13-Sep-15 10:10, US Carotid Doppler Bilateral  US Carotid Doppler Bilateral   REASON FOR EXAM:    cva  COMMENTS:       PROCEDURE: [place]  - US CAROTID DOPPLER BILATERAL  - Aug 21 2014 10:10AM     CLINICAL DATA:  cva, syncope    EXAM:  BILATERAL CAROTID DUPLEX ULTRASOUND    TECHNIQUE:  Pearline Cables scale imaging, color Doppler and duplex ultrasound was  performed of bilateral carotid and vertebral arteries in the neck.    COMPARISON:  None.  REVIEW OF  SYSTEMS:  Quantification of carotid stenosis is based on velocity parameters  that correlate the residual internal carotid diameter with  NASCET-based stenosis levels, using the diameter of the distal  internal carotid lumen as the denominator for stenosis measurement.    The following velocity measurements were obtained:    PEAK SYSTOLIC/END DIASTOLIC    RIGHT    ICA:    61/22cm/sec    CCA:                     35/32DJ/MEQ  SYSTOLIC ICA/CCA RATIO:  6.83    DIASTOLIC ICA/CCA RATIO: 4.19    ECA:                     65cm/sec    LEFT    ICA:                     49/18cm/sec    CCA:                     62/22LN/LGX    SYSTOLIC ICA/CCA RATIO:  2.11    DIASTOLIC ICA/CCA RATIO: 1.1  ECA:                     47cm/sec    FINDINGS:  RIGHT CAROTID ARTERY: Minimal intimal thickening. No focal plaque  accumulation or stenosis. Normal waveforms and color Doppler signal.    RIGHT VERTEBRAL ARTERY:  Normal flow direction and waveform.    LEFT CAROTID ARTERY: Mild intimal thickening. Smooth nonocclusive  plaque in the carotid bulb, and proximal ICA. No high-grade  stenosis. Normal waveforms and color Doppler signal.    LEFT VERTEBRAL ARTERY: Normal flow direction and waveform.     IMPRESSION:  1. 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 The exam does not exclude plaque  ulceration or embolization. Continued surveillance recommended.      Electronically Signed    By: Arne Cleveland M.D.    On: 08/21/2014 10:20         Verified By: Kandis Cocking, M.D.,  CT:    12-Sep-15 16:27, CT Head Without Contrast  CT Head Without Contrast   REASON FOR EXAM:    numbness and weakness to left arm since 0800  COMMENTS:       PROCEDURE: CT  - CT HEAD WITHOUT CONTRAST  - Aug 20 2014  4:27PM     CLINICAL DATA:  54 year old male with left arm numbness and  weakness.    EXAM:  CT HEAD WITHOUT CONTRAST    TECHNIQUE:  Contiguous axial images were obtained from the base of the skull  through the vertex without intravenous contrast.  COMPARISON:  None.    FINDINGS:  Bilateral posterior parietal infarcts are identified and appear  acute to subacute.    A small age indeterminate infarct within the left frontal lobe  noted.    Mild probable chronic small-vessel white matter ischemic changes  noted.    There is no evidence of hemorrhage, hydrocephalus, midline shift,  mass lesion or extra-axial collection.  The visualized bony calvarium is unremarkable.     IMPRESSION:  Age indeterminate bilateral posterior parietal and left frontal  infarcts -probably acute to subacute. No evidence of hemorrhage.    Mild probable chronic small-vessel white matter ischemic changes.      Electronically Signed    By: Coral Spikes.D.  On: 08/20/2014 16:41         Verified By: Lura Em, M.D.,   Radiology Impression: Radiology Impression: CT of head reviewed by me and shows an old L parietoccipital, R parietoccipital and B frontal infarcts   Impression/Recommendations: Recommendations:   prior notes reviewed by me reviewed by me   Probable cervical myelopathy-  pt denotes neck pain and L arm weakness with hyperreflexia.  This could also be  thyroid or B12 deficiency but those are not typically associated with pain.  This is likely cause of L arm weakness but a stroke could do this as well. Bilateral watershed infarcts-  these appear to be chronic in nature and could be symptomatic if 1. is not a problem.  Concern for hypotension as cause due to the distrobution of these infarcts. MRI of brain and c-spine MRA of neck and brain start ASA 54m daily would keep Hgb > 8 to prevent further infarcts needs TTE and carotids lipids pending permissive HTN ok for now start baclofen 518mTID for increased tone will follow closely  Electronic Signatures: SmJamison NeighborMD)  (Signed 13-Sep-15 16:30)  Authored: REFERRING PHYSICIAN, Primary Care Physician, Consult, History of Present Illness, Review of Systems, PAST MEDICAL/SURGICAL HISTORY, HOME MEDICATIONS, ALLERGIES, Social/Family History, NURSING VITAL SIGNS, Physical Exam-, LAB RESULTS, RADIOLOGY RESULTS, Recommendations   Last Updated: 13-Sep-15 16:30 by SmJamison NeighborMD)

## 2015-04-01 NOTE — Consult Note (Signed)
Details:   - GI Note:  Mr [name] was discharged today by the time I came to his room to see him.   I spoke to him on the phone.  I offered to [facility] but he wants to [facility] with his [facility] doctor.  He thinks he has an appt in a few weks. I told him to call there and let them know about the bleeding and low blood counts.   Given his anemia and bleeding, he needs to be on a prednisone taper.  I will send [facility] to [facility] [place] for him.   prednisone 40 mg x 7 days, 30 x 7, 20 x 7, 10 x 7.   Electronic Signatures: Dow Adolphein, Nadia Viar (MD)  (Signed 20-May-15 13:18)  Authored: Details   Last Updated: 20-May-15 13:18 by Dow Adolphein, Deshay Kirstein (MD)

## 2015-09-13 ENCOUNTER — Encounter: Payer: Self-pay | Admitting: *Deleted

## 2015-09-14 ENCOUNTER — Encounter: Admission: RE | Payer: Self-pay | Source: Ambulatory Visit

## 2015-09-14 ENCOUNTER — Ambulatory Visit: Admission: RE | Admit: 2015-09-14 | Payer: Medicaid Other | Source: Ambulatory Visit | Admitting: Gastroenterology

## 2015-09-14 HISTORY — DX: Cerebral infarction, unspecified: I63.9

## 2015-09-14 HISTORY — DX: Essential (primary) hypertension: I10

## 2015-09-14 HISTORY — DX: Hyperlipidemia, unspecified: E78.5

## 2015-09-14 SURGERY — COLONOSCOPY WITH PROPOFOL
Anesthesia: General

## 2015-11-07 ENCOUNTER — Other Ambulatory Visit: Payer: Self-pay | Admitting: Neurology

## 2015-11-07 DIAGNOSIS — I6990 Unspecified sequelae of unspecified cerebrovascular disease: Secondary | ICD-10-CM

## 2015-11-08 ENCOUNTER — Telehealth (HOSPITAL_COMMUNITY): Payer: Self-pay | Admitting: Orthopedic Surgery

## 2015-11-08 ENCOUNTER — Other Ambulatory Visit: Payer: Self-pay | Admitting: Neurology

## 2015-11-08 DIAGNOSIS — M7989 Other specified soft tissue disorders: Secondary | ICD-10-CM

## 2015-11-08 DIAGNOSIS — M79605 Pain in left leg: Secondary | ICD-10-CM

## 2015-11-09 ENCOUNTER — Ambulatory Visit
Admission: RE | Admit: 2015-11-09 | Discharge: 2015-11-09 | Disposition: A | Discharge status: Home / Self Care | Payer: Medicaid Other | Source: Ambulatory Visit | Attending: Neurology | Admitting: Neurology

## 2015-11-22 ENCOUNTER — Encounter: Payer: Self-pay | Admitting: Emergency Medicine

## 2015-11-22 ENCOUNTER — Emergency Department: Payer: Medicaid Other

## 2015-11-22 ENCOUNTER — Inpatient Hospital Stay
Admission: EM | Admit: 2015-11-22 | Discharge: 2015-11-24 | DRG: 387 | Disposition: A | Discharge status: Home / Self Care | Payer: Medicaid Other | Attending: Internal Medicine | Admitting: Internal Medicine

## 2015-11-22 DIAGNOSIS — D649 Anemia, unspecified: Secondary | ICD-10-CM | POA: Diagnosis present

## 2015-11-22 DIAGNOSIS — K5190 Ulcerative colitis, unspecified, without complications: Secondary | ICD-10-CM | POA: Diagnosis present

## 2015-11-22 DIAGNOSIS — Z79899 Other long term (current) drug therapy: Secondary | ICD-10-CM

## 2015-11-22 DIAGNOSIS — E785 Hyperlipidemia, unspecified: Secondary | ICD-10-CM | POA: Diagnosis present

## 2015-11-22 DIAGNOSIS — I10 Essential (primary) hypertension: Secondary | ICD-10-CM | POA: Diagnosis present

## 2015-11-22 DIAGNOSIS — Z880 Allergy status to penicillin: Secondary | ICD-10-CM

## 2015-11-22 DIAGNOSIS — K51911 Ulcerative colitis, unspecified with rectal bleeding: Secondary | ICD-10-CM | POA: Diagnosis not present

## 2015-11-22 DIAGNOSIS — Z8673 Personal history of transient ischemic attack (TIA), and cerebral infarction without residual deficits: Secondary | ICD-10-CM

## 2015-11-22 DIAGNOSIS — Z881 Allergy status to other antibiotic agents status: Secondary | ICD-10-CM

## 2015-11-22 DIAGNOSIS — R42 Dizziness and giddiness: Secondary | ICD-10-CM

## 2015-11-22 DIAGNOSIS — K51819 Other ulcerative colitis with unspecified complications: Secondary | ICD-10-CM

## 2015-11-22 DIAGNOSIS — R197 Diarrhea, unspecified: Secondary | ICD-10-CM

## 2015-11-22 DIAGNOSIS — Z7902 Long term (current) use of antithrombotics/antiplatelets: Secondary | ICD-10-CM

## 2015-11-22 HISTORY — DX: Ulcerative colitis, unspecified, without complications: K51.90

## 2015-11-22 LAB — CBC
HCT: 29.7 % — ABNORMAL LOW (ref 40.0–52.0)
Hemoglobin: 8.8 g/dL — ABNORMAL LOW (ref 13.0–18.0)
MCH: 19.1 pg — AB (ref 26.0–34.0)
MCHC: 29.6 g/dL — ABNORMAL LOW (ref 32.0–36.0)
MCV: 64.7 fL — AB (ref 80.0–100.0)
PLATELETS: 340 10*3/uL (ref 150–440)
RBC: 4.58 MIL/uL (ref 4.40–5.90)
RDW: 20.2 % — AB (ref 11.5–14.5)
WBC: 7.4 10*3/uL (ref 3.8–10.6)

## 2015-11-22 LAB — COMPREHENSIVE METABOLIC PANEL
ALK PHOS: 61 U/L (ref 38–126)
ALT: 12 U/L — AB (ref 17–63)
AST: 15 U/L (ref 15–41)
Albumin: 2.8 g/dL — ABNORMAL LOW (ref 3.5–5.0)
Anion gap: 5 (ref 5–15)
BUN: 8 mg/dL (ref 6–20)
CALCIUM: 8.5 mg/dL — AB (ref 8.9–10.3)
CHLORIDE: 107 mmol/L (ref 101–111)
CO2: 26 mmol/L (ref 22–32)
CREATININE: 0.91 mg/dL (ref 0.61–1.24)
Glucose, Bld: 100 mg/dL — ABNORMAL HIGH (ref 65–99)
Potassium: 3.6 mmol/L (ref 3.5–5.1)
SODIUM: 138 mmol/L (ref 135–145)
Total Bilirubin: 0.4 mg/dL (ref 0.3–1.2)
Total Protein: 6.4 g/dL — ABNORMAL LOW (ref 6.5–8.1)

## 2015-11-22 LAB — TYPE AND SCREEN
ABO/RH(D): O POS
Antibody Screen: NEGATIVE

## 2015-11-22 LAB — APTT: APTT: 29 s (ref 24–36)

## 2015-11-22 LAB — C DIFFICILE QUICK SCREEN W PCR REFLEX
C DIFFICILE (CDIFF) INTERP: NEGATIVE
C DIFFICILE (CDIFF) TOXIN: NEGATIVE
C DIFFICLE (CDIFF) ANTIGEN: NEGATIVE

## 2015-11-22 LAB — PROTIME-INR
INR: 0.99
Prothrombin Time: 13.3 seconds (ref 11.4–15.0)

## 2015-11-22 MED ORDER — IOHEXOL 240 MG/ML SOLN: 25.0000 mL | Freq: Once | INTRAMUSCULAR | Status: DC | PRN

## 2015-11-22 MED ORDER — ATORVASTATIN CALCIUM 20 MG PO TABS
20.0000 mg | ORAL_TABLET | Freq: Every day | ORAL | Status: DC
  Administered 2015-11-23: 20 mg via ORAL
  Filled 2015-11-22: qty 1

## 2015-11-22 MED ORDER — ACETAMINOPHEN 650 MG RE SUPP: 650.0000 mg | Freq: Four times a day (QID) | RECTAL | Status: DC | PRN

## 2015-11-22 MED ORDER — CLOPIDOGREL BISULFATE 75 MG PO TABS
75.0000 mg | ORAL_TABLET | Freq: Every day | ORAL | Status: DC
  Administered 2015-11-24: 75 mg via ORAL
  Filled 2015-11-22 (×2): qty 1

## 2015-11-22 MED ORDER — CIPROFLOXACIN IN D5W 400 MG/200ML IV SOLN
400.0000 mg | Freq: Once | INTRAVENOUS | Status: AC
  Administered 2015-11-22: 400 mg via INTRAVENOUS
  Filled 2015-11-22: qty 200

## 2015-11-22 MED ORDER — CIPROFLOXACIN IN D5W 400 MG/200ML IV SOLN
400.0000 mg | Freq: Two times a day (BID) | INTRAVENOUS | Status: DC
  Administered 2015-11-23 (×2): 400 mg via INTRAVENOUS
  Filled 2015-11-22 (×4): qty 200

## 2015-11-22 MED ORDER — ONDANSETRON HCL 4 MG PO TABS: 4.0000 mg | ORAL_TABLET | Freq: Four times a day (QID) | ORAL | Status: DC | PRN

## 2015-11-22 MED ORDER — METOPROLOL SUCCINATE ER 25 MG PO TB24
12.5000 mg | ORAL_TABLET | Freq: Every day | ORAL | Status: DC
  Administered 2015-11-23: 12.5 mg via ORAL
  Filled 2015-11-22: qty 1

## 2015-11-22 MED ORDER — METHYLPREDNISOLONE SODIUM SUCC 40 MG IJ SOLR
40.0000 mg | Freq: Two times a day (BID) | INTRAMUSCULAR | Status: DC
Start: 2015-11-22 — End: 2015-11-23
  Administered 2015-11-22 – 2015-11-23 (×3): 40 mg via INTRAVENOUS
  Filled 2015-11-22 (×3): qty 1

## 2015-11-22 MED ORDER — BALSALAZIDE DISODIUM 750 MG PO CAPS
750.0000 mg | ORAL_CAPSULE | Freq: Three times a day (TID) | ORAL | Status: DC
  Administered 2015-11-22 – 2015-11-24 (×5): 750 mg via ORAL
  Filled 2015-11-22 (×7): qty 1

## 2015-11-22 MED ORDER — ACETAMINOPHEN 325 MG PO TABS: 650.0000 mg | ORAL_TABLET | Freq: Four times a day (QID) | ORAL | Status: DC | PRN

## 2015-11-22 MED ORDER — PREDNISONE 20 MG PO TABS
60.0000 mg | ORAL_TABLET | Freq: Once | ORAL | Status: AC
  Administered 2015-11-22: 60 mg via ORAL
  Filled 2015-11-22: qty 3

## 2015-11-22 MED ORDER — FERROUS SULFATE 325 (65 FE) MG PO TABS
325.0000 mg | ORAL_TABLET | Freq: Every day | ORAL | Status: DC
  Administered 2015-11-23 – 2015-11-24 (×2): 325 mg via ORAL
  Filled 2015-11-22 (×2): qty 1

## 2015-11-22 MED ORDER — ONDANSETRON HCL 4 MG/2ML IJ SOLN: 4.0000 mg | Freq: Four times a day (QID) | INTRAMUSCULAR | Status: DC | PRN

## 2015-11-22 MED ORDER — HEPARIN SODIUM (PORCINE) 5000 UNIT/ML IJ SOLN: 5000.0000 [IU] | Freq: Three times a day (TID) | INTRAMUSCULAR | Status: DC

## 2015-11-22 MED ORDER — POTASSIUM CHLORIDE IN NACL 20-0.9 MEQ/L-% IV SOLN
INTRAVENOUS | Status: DC
  Administered 2015-11-23 (×3): via INTRAVENOUS
  Filled 2015-11-22 (×8): qty 1000

## 2015-11-22 MED ORDER — IOHEXOL 300 MG/ML  SOLN
100.0000 mL | Freq: Once | INTRAMUSCULAR | Status: AC | PRN
  Administered 2015-11-22: 100 mL via INTRAVENOUS

## 2015-11-22 MED ORDER — SODIUM CHLORIDE 0.9 % IV BOLUS (SEPSIS)
1000.0000 mL | Freq: Once | INTRAVENOUS | Status: AC
Start: 2015-11-22 — End: 2015-11-22
  Administered 2015-11-22: 1000 mL via INTRAVENOUS

## 2015-11-22 MED ORDER — TRAZODONE HCL 50 MG PO TABS
50.0000 mg | ORAL_TABLET | Freq: Every evening | ORAL | Status: DC | PRN
  Administered 2015-11-22: 50 mg via ORAL
  Filled 2015-11-22: qty 1

## 2015-11-22 MED ORDER — IOHEXOL 240 MG/ML SOLN
25.0000 mL | INTRAMUSCULAR | Status: AC
  Administered 2015-11-22 (×2): 25 mL via ORAL

## 2015-11-22 MED ORDER — METRONIDAZOLE IN NACL 5-0.79 MG/ML-% IV SOLN
500.0000 mg | Freq: Once | INTRAVENOUS | Status: AC
  Administered 2015-11-22: 500 mg via INTRAVENOUS
  Filled 2015-11-22: qty 100

## 2015-11-22 MED ORDER — HYDROCODONE-ACETAMINOPHEN 5-325 MG PO TABS: 1.0000 | ORAL_TABLET | ORAL | Status: DC | PRN

## 2015-11-22 MED ORDER — MORPHINE SULFATE (PF) 2 MG/ML IV SOLN: 1.0000 mg | INTRAVENOUS | Status: DC | PRN

## 2015-11-22 MED ORDER — VITAMIN D 1000 UNITS PO TABS
5000.0000 [IU] | ORAL_TABLET | Freq: Every day | ORAL | Status: DC
  Administered 2015-11-23 – 2015-11-24 (×2): 5000 [IU] via ORAL
  Filled 2015-11-22 (×2): qty 5

## 2015-11-22 MED ORDER — METRONIDAZOLE IN NACL 5-0.79 MG/ML-% IV SOLN
500.0000 mg | Freq: Three times a day (TID) | INTRAVENOUS | Status: DC
  Administered 2015-11-22 – 2015-11-24 (×5): 500 mg via INTRAVENOUS
  Filled 2015-11-22 (×8): qty 100

## 2015-11-22 NOTE — ED Notes (Signed)
MD completed rectal exam at bedside. Hemoccult positive.

## 2015-11-22 NOTE — ED Provider Notes (Signed)
[facility] Emergency Department Provider Note  ____________________________________________  Time seen: Approximately 12:43 PM  I have reviewed the triage vital signs and the nursing notes.   HISTORY  Chief Complaint Rectal Bleeding    HPI [name] is a 53 y.o. male with a history of ulcerative colitis, HTN, HL, and CVA on Plavix presenting with bright red blood per rectum for 3 weeks. Patient reports that for at least 3 weeks he is having greater than 10 episodes daily of bloody stools, or "milky" colored stools. He describes enough blood that the toilet bowl turned red. He states he is unable to sleep or leave the house "because after to the bathroom all the time." He has no associated abdominal pain. He occasionally feels short of breath with exertion but denies any lightheadedness or syncope. No nausea or vomiting, chest pain, palpitations, fever, pain with defecation. Positive weight loss.   Past Medical History  Diagnosis Date  . Stroke (HCC)   . Hypertension   . Hyperlipidemia     There are no active problems to display for this patient.   History reviewed. No pertinent past surgical history.  Current Outpatient Rx  Name  Route  Sig  Dispense  Refill  . aspirin 81 MG tablet   Oral   Take 81 mg by mouth daily.         Marland Kitchen atorvastatin (LIPITOR) 20 MG tablet   Oral   Take 20 mg by mouth daily.         . clopidogrel (PLAVIX) 75 MG tablet   Oral   Take 75 mg by mouth daily.         . ferrous sulfate 325 (65 FE) MG tablet   Oral   Take 325 mg by mouth daily with breakfast.         . mesalamine (ASACOL) 400 MG EC tablet   Oral   Take 400 mg by mouth QID.         Marland Kitchen metoprolol succinate (TOPROL-XL) 25 MG 24 hr tablet   Oral   Take 50 mg by mouth daily.           Allergies Cephalosporins and Penicillins  History reviewed. No pertinent family history.  Social History Social History  Substance Use Topics  .  Smoking status: Never Smoker   . Smokeless tobacco: Never Used  . Alcohol Use: No    Review of Systems Constitutional: No fever/chills. Negative lightheadedness. Negative syncope. Eyes: No visual changes. ENT: No sore throat. Cardiovascular: Denies chest pain, palpitations. Respiratory: Positive shortness of breath.  No cough. Gastrointestinal: No abdominal pain.  No nausea, no vomiting.  No diarrhea.  No constipation. Positive lid in stool. Genitourinary: Negative for dysuria. Musculoskeletal: Negative for back pain. Skin: Negative for rash. Neurological: Negative for headaches, focal weakness or numbness.  10-point ROS otherwise negative.  ____________________________________________   PHYSICAL EXAM:  VITAL SIGNS: ED Triage Vitals  Enc Vitals Group     BP 11/22/15 1156 115/87 mmHg     Pulse Rate 11/22/15 1156 102     Resp 11/22/15 1156 26     Temp 11/22/15 1156 98.3 F (36.8 C)     Temp Source 11/22/15 1156 Oral     SpO2 11/22/15 1156 100 %     Weight 11/22/15 1201 168 lb (76.204 kg)     Height 11/22/15 1156  (1.778 m)     Head Cir --      Peak Flow --  Pain Score --      Pain Loc --      Pain Edu? --      Excl. in GC? --     Constitutional: Patient is alert and oriented and able to answer questions appropriately. He has a bizarre affect but normal judgment.  Eyes: Conjunctivae are normal.  EOMI. positive conjunctival pallor. Head: Atraumatic. Nose: No congestion/rhinnorhea. Mouth/Throat: Mucous membranes are dry.  Neck: No stridor.  Supple.  No JVD Cardiovascular: Normal rate, regular rhythm. No murmurs, rubs or gallops.  Respiratory: Normal respiratory effort.  No retractions. Lungs CTAB.  No wheezes, rales or ronchi. Gastrointestinal: Abdomen is soft, nondistended, and diffusely tender to palpation without focality. No guarding or rebound, no peritoneal signs, negative Murphy sign. Genitourinary: No evidence of internal or external hemorrhoids.  Rectal exam is nontender, brown stool that is guaiac positive. Musculoskeletal: No LE edema.  Neurologic:  Normal speech and language. No gross focal neurologic deficits are appreciated.  Skin:  Skin is warm, dry and intact. No rash noted. + pallor Psychiatric: Patient has a bizarre affect and occasional difficulty answering questions directly. He does not have pressured speech  ____________________________________________   LABS (all labs ordered are listed, but only abnormal results are displayed)  Labs Reviewed  COMPREHENSIVE METABOLIC PANEL - Abnormal; Notable for the following:    Glucose, Bld 100 (*)    Calcium 8.5 (*)    Total Protein 6.4 (*)    Albumin 2.8 (*)    ALT 12 (*)    All other components within normal limits  CBC - Abnormal; Notable for the following:    Hemoglobin 8.8 (*)    HCT 29.7 (*)    MCV 64.7 (*)    MCH 19.1 (*)    MCHC 29.6 (*)    RDW 20.2 (*)    All other components within normal limits  C DIFFICILE QUICK SCREEN W PCR REFLEX  APTT  PROTIME-INR  TYPE AND SCREEN  TYPE AND SCREEN   ____________________________________________  EKG  ED ECG REPORT I, Rockne MenghiniNorman, Anne-Caroline, the attending physician, personally viewed and interpreted this ECG.   Date: 11/22/2015  EKG Time: 1208  Rate: 100  Rhythm: normal EKG, normal sinus rhythm, unchanged from previous tracings, sinus tachycardia  Axis: Normal  Intervals:none  ST&T Change: No ST elevation  ____________________________________________  RADIOLOGY  Ct Abdomen Pelvis W Contrast  11/22/2015  CLINICAL DATA:  Rectal bleeding for 3 weeks. EXAM: CT ABDOMEN AND PELVIS WITH CONTRAST TECHNIQUE: Multidetector CT imaging of the abdomen and pelvis was performed using the standard protocol following bolus administration of intravenous contrast. CONTRAST:  100mL OMNIPAQUE IOHEXOL 300 MG/ML  SOLN COMPARISON:  None. FINDINGS: Mild multilevel degenerative disc disease is noted in the lumbar spine. Visualized  lung bases are unremarkable. No gallstones are noted. The liver, spleen and pancreas appear normal. Adrenal glands appear normal. No hydronephrosis or renal obstruction is noted. No renal or ureteral calculi are noted. Atherosclerosis of abdominal aorta is noted without aneurysm formation. There is no evidence of bowel obstruction. There is noted diffuse wall thickening of the colon, particularly in the sigmoid region, consistent with infectious or inflammatory colitis. No abnormal fluid collection is noted. Urinary bladder appears normal. No significant adenopathy is noted. IMPRESSION: Atherosclerosis of abdominal aorta without aneurysm formation. Diffuse wall thickening with mild surrounding inflammation is seen in the colon, particularly in the sigmoid region, consistent with inflammatory or infectious colitis. Electronically Signed   By: Lupita RaiderJames  Green Jr, M.D.   On:  11/22/2015 14:45    ____________________________________________   PROCEDURES  Procedure(s) performed: None  Critical Care performed: No ____________________________________________   INITIAL IMPRESSION / ASSESSMENT AND PLAN / ED COURSE  Pertinent labs & imaging results that were available during my care of the patient were reviewed by me and considered in my medical decision making (see chart for details).  54 y.o. male with a history of CVA on Plavix, history of ulcerative colitis, presenting with greater than 3 weeks of greater than 10 bloody bowel movements daily. Clinically, I'm concerned that the patient is anemic given his pallor and mild tachycardia. He also does have some diffuse abdominal pain which may be related to a flare of ulcerative colitis but given that he is a poor historian CT SCAN to evaluate for any other acute pathology. Anticipate admission to the hospital.  ----------------------------------------- 2:52 PM on 11/22/2015 -----------------------------------------  The patient remains hemodynamically  stable and has a stable anemia. His H&H are at baseline or better than his usual baseline. He is afebrile and has a normal white blood cell count, however given the CT scan results of diffuse inflammation versus infection, I will check him for C. difficile as well as treat him with Cipro and Flagyl. Plan admission to the hospital.  ____________________________________________  FINAL CLINICAL IMPRESSION(S) / ED DIAGNOSES  Final diagnoses:  Other ulcerative colitis with complication (HCC)  Bloody diarrhea  Lightheadedness      NEW MEDICATIONS STARTED DURING THIS VISIT:  New Prescriptions   No medications on file     Rockne Menghini, MD 11/22/15 1454

## 2015-11-22 NOTE — H&P (Signed)
[facility] - [place] at [facility]   PATIENT NAME: [name]    MR#:  [id number]  DATE OF BIRTH:  07-22-1961  DATE OF ADMISSION:  11/22/2015  PRIMARY CARE PHYSICIAN: Domenic Schwab, FNP   REQUESTING/REFERRING PHYSICIAN: Dr. Sharma Covert  CHIEF COMPLAINT:   Chief Complaint  Patient presents with  . Rectal Bleeding    HISTORY OF PRESENT ILLNESS:  [name]  is a 54 y.o. male with a known history of ulcerative colitis, hypertension, hyperlipidemia, history of previous CVA who presents to the hospital due to rectal bleeding and weakness. Patient says he has a history of ulcerative colitis and has been having worsening diarrhea now ongoing for the past few weeks. Has his stool has been watery and mucousy in nature and also bloody. He says he's been having more than 10 loose bowel movements every day. He has been tolerating diet okay and denies any nausea or vomiting. He denies any fevers, chills. He denies any abdominal pain. He says he's been feeling more and more weak due to worsening diarrhea and therefore came to the ER for further evaluation. Patient CT scan of abdomen pelvis was consistent with underlying colitis without any abscess formation but given his worsening diarrhea and symptoms hospitalist service were contacted for further treatment and evaluation.    PAST MEDICAL HISTORY:   Past Medical History  Diagnosis Date  . Stroke (HCC)   . Hypertension   . Hyperlipidemia   . Ulcerative colitis (HCC)     PAST SURGICAL HISTORY:  History reviewed. No pertinent past surgical history.  SOCIAL HISTORY:   Social History  Substance Use Topics  . Smoking status: Never Smoker   . Smokeless tobacco: Never Used  . Alcohol Use: No    FAMILY HISTORY:   Family History  Problem Relation Age of Onset  . Breast cancer Mother     DRUG ALLERGIES:   Allergies  Allergen Reactions  . Cephalosporins Other (See Comments)    Reaction:  Unknown    . Penicillins Other (See Comments)    Reaction:  Unknown     REVIEW OF SYSTEMS:   Review of Systems  Constitutional: Negative for fever and weight loss.  HENT: Negative for congestion, nosebleeds and tinnitus.   Eyes: Negative for blurred vision, double vision and redness.  Respiratory: Negative for cough, hemoptysis and shortness of breath.   Cardiovascular: Negative for chest pain, orthopnea, leg swelling and PND.  Gastrointestinal: Positive for diarrhea and blood in stool. Negative for nausea, vomiting, abdominal pain and melena.  Genitourinary: Negative for dysuria, urgency and hematuria.  Musculoskeletal: Negative for joint pain and falls.  Neurological: Positive for weakness (generalized). Negative for dizziness, tingling, sensory change, focal weakness, seizures and headaches.  Endo/Heme/Allergies: Negative for polydipsia. Does not bruise/bleed easily.  Psychiatric/Behavioral: Negative for depression and memory loss. The patient is not nervous/anxious.     MEDICATIONS AT HOME:   Prior to Admission medications   Medication Sig Start Date End Date Taking? Authorizing Provider  atorvastatin (LIPITOR) 20 MG tablet Take 20 mg by mouth at bedtime.    Yes Historical Provider, MD  balsalazide (COLAZAL) 750 MG capsule Take 750 mg by mouth 3 (three) times daily.   Yes Historical Provider, MD  Cholecalciferol (VITAMIN D3) 5000 UNITS TABS Take 5,000 Units by mouth daily.   Yes Historical Provider, MD  clopidogrel (PLAVIX) 75 MG tablet Take 75 mg by mouth daily.   Yes Historical Provider, MD  ferrous sulfate 325 (65 FE) MG  tablet Take 325 mg by mouth daily.    Yes Historical Provider, MD  metoprolol succinate (TOPROL-XL) 25 MG 24 hr tablet Take 12.5 mg by mouth daily.    Yes Historical Provider, MD  traZODone (DESYREL) 50 MG tablet Take 50 mg by mouth at bedtime as needed for sleep.   Yes Historical Provider, MD      VITAL SIGNS:  Blood pressure 116/84, pulse 73, temperature 98.3 F  (36.8 C), temperature source Oral, resp. rate 26, height 5\' 10"  (1.778 m), weight 76.204 kg (168 lb), SpO2 100 %.  PHYSICAL EXAMINATION:  Physical Exam  GENERAL:  54 y.o.-year-old patient lying in the bed with no acute distress.  EYES: Pupils equal, round, reactive to light and accommodation. No scleral icterus. Extraocular muscles intact.  HEENT: Head atraumatic, normocephalic. Oropharynx and nasopharynx clear. No oropharyngeal erythema, moist oral mucosa  NECK:  Supple, no jugular venous distention. No thyroid enlargement, no tenderness.  LUNGS: Normal breath sounds bilaterally, no wheezing, rales, rhonchi. No use of accessory muscles of respiration.  CARDIOVASCULAR: S1, S2 RRR. No murmurs, rubs, gallops, clicks.  ABDOMEN: Soft, Tender in LLQ but no rebound rigidity, nondistended. Bowel sounds present. No organomegaly or mass.  EXTREMITIES: No pedal edema, cyanosis, or clubbing. + 2 pedal & radial pulses b/l.   NEUROLOGIC: Cranial nerves II through XII are intact. No focal Motor or sensory deficits appreciated b/l PSYCHIATRIC: The patient is alert and oriented x 3. Good affect.  SKIN: No obvious rash, lesion, or ulcer.   LABORATORY PANEL:   CBC  Recent Labs Lab 11/22/15 1203  WBC 7.4  HGB 8.8*  HCT 29.7*  PLT 340   ------------------------------------------------------------------------------------------------------------------  Chemistries   Recent Labs Lab 11/22/15 1203  NA 138  K 3.6  CL 107  CO2 26  GLUCOSE 100*  BUN 8  CREATININE 0.91  CALCIUM 8.5*  AST 15  ALT 12*  ALKPHOS 61  BILITOT 0.4   ------------------------------------------------------------------------------------------------------------------  Cardiac Enzymes No results for input(s): TROPONINI in the last 168 hours. ------------------------------------------------------------------------------------------------------------------  RADIOLOGY:  Ct Abdomen Pelvis W Contrast  11/22/2015   CLINICAL DATA:  Rectal bleeding for 3 weeks. EXAM: CT ABDOMEN AND PELVIS WITH CONTRAST TECHNIQUE: Multidetector CT imaging of the abdomen and pelvis was performed using the standard protocol following bolus administration of intravenous contrast. CONTRAST:  100mL OMNIPAQUE IOHEXOL 300 MG/ML  SOLN COMPARISON:  None. FINDINGS: Mild multilevel degenerative disc disease is noted in the lumbar spine. Visualized lung bases are unremarkable. No gallstones are noted. The liver, spleen and pancreas appear normal. Adrenal glands appear normal. No hydronephrosis or renal obstruction is noted. No renal or ureteral calculi are noted. Atherosclerosis of abdominal aorta is noted without aneurysm formation. There is no evidence of bowel obstruction. There is noted diffuse wall thickening of the colon, particularly in the sigmoid region, consistent with infectious or inflammatory colitis. No abnormal fluid collection is noted. Urinary bladder appears normal. No significant adenopathy is noted. IMPRESSION: Atherosclerosis of abdominal aorta without aneurysm formation. Diffuse wall thickening with mild surrounding inflammation is seen in the colon, particularly in the sigmoid region, consistent with inflammatory or infectious colitis. Electronically Signed   By: Lupita RaiderJames  Green Jr, M.D.   On: 11/22/2015 14:45     IMPRESSION AND PLAN:   54 year old male with past medical history of ulcerative colitis, hypertension, hyperlipidemia, history of previous CVA who presents to the hospital due to worsening diarrhea and rectal bleeding.  #1 ulcerative colitis with rectal bleeding-patient seems to have a flareup of  his colitis presently. -His hemoglobin is also stable and his CT scan of abdomen pelvis does not show any evidence of acute fluid collection or abscess. -I will start him on IV steroids, IV ciprofloxacin and Flagyl. Cont. Colazal. I will get a gastroenterology consult. -Place him on a clear liquid diet and follow him  clinically.  #2 diarrhea-this is likely secondary to the ulcerative colitis. -I will check stool for comprehensive culture, C. difficile. Place on clear liquid diet and continue supportive care. - get a gastroenterology consult  #3 hypertension-continue metoprolol.  #4 hyperlipidemia-continue atorvastatin.  #5 chronic anemia-secondary to the underlying colitis. Hemoglobin stable no acute need for transfusion presently.  #6 history of previous CVA-continue Plavix, statin.   All the records are reviewed and case discussed with ED provider. Management plans discussed with the patient, family and they are in agreement.  CODE STATUS: Full  TOTAL TIME TAKING CARE OF THIS PATIENT: 50 minutes.    Houston Siren M.D on 11/22/2015 at 4:23 PM  Between 7am to 6pm - Pager - [phone]  After 6pm go to [email or web address] - password [email or web address] [facility]  [address] [place] [facility]  Office  [phone]  CC: Primary care physician; Domenic Schwab, FNP

## 2015-11-22 NOTE — ED Notes (Addendum)
Has been having blood from rectum that pt describes as "milky blood" color.  Has been for 3 weeks. Not sure why hasn't come. Uncle reports losing weight and clothes are falling off. Cannot sleep because so many bowel movements.  Pt appears short of breath in triage.  Per uncle has not acted right since strokes. Difficult to get accurate hx.

## 2015-11-23 LAB — BASIC METABOLIC PANEL
ANION GAP: 3 — AB (ref 5–15)
CHLORIDE: 109 mmol/L (ref 101–111)
CO2: 22 mmol/L (ref 22–32)
Calcium: 7.9 mg/dL — ABNORMAL LOW (ref 8.9–10.3)
Creatinine, Ser: 0.71 mg/dL (ref 0.61–1.24)
GFR calc Af Amer: >60 mL/min (ref >60)
GLUCOSE: 147 mg/dL — AB (ref 65–99)
POTASSIUM: 4.1 mmol/L (ref 3.5–5.1)
Sodium: 134 mmol/L — ABNORMAL LOW (ref 135–145)

## 2015-11-23 LAB — CBC
HCT: 28.8 % — ABNORMAL LOW (ref 40.0–52.0)
Hemoglobin: 8.7 g/dL — ABNORMAL LOW (ref 13.0–18.0)
MCH: 19.6 pg — AB (ref 26.0–34.0)
MCHC: 30.3 g/dL — AB (ref 32.0–36.0)
MCV: 64.6 fL — ABNORMAL LOW (ref 80.0–100.0)
PLATELETS: 290 10*3/uL (ref 150–440)
RBC: 4.46 MIL/uL (ref 4.40–5.90)
RDW: 20.6 % — ABNORMAL HIGH (ref 11.5–14.5)
WBC: 2.7 10*3/uL — ABNORMAL LOW (ref 3.8–10.6)

## 2015-11-23 LAB — ABO/RH: ABO/RH(D): O POS

## 2015-11-23 MED ORDER — METHYLPREDNISOLONE SODIUM SUCC 40 MG IJ SOLR
15.0000 mg | Freq: Four times a day (QID) | INTRAMUSCULAR | Status: DC
  Administered 2015-11-23 – 2015-11-24 (×2): 15.2 mg via INTRAVENOUS
  Filled 2015-11-23 (×2): qty 1

## 2015-11-23 NOTE — Progress Notes (Signed)
[facility] - [place] at [facility]   PATIENT NAME: [name]    MR#:  [id number]  DATE OF BIRTH:  May 05, 1961  SUBJECTIVE:  I am very hungry. Denies abdominal pain at present. Wants to eat and go home  REVIEW OF SYSTEMS:   Review of Systems  Constitutional: Negative for fever, chills and weight loss.  HENT: Negative for ear discharge, ear pain and nosebleeds.   Eyes: Negative for blurred vision, pain and discharge.  Respiratory: Negative for sputum production, shortness of breath, wheezing and stridor.   Cardiovascular: Negative for chest pain, palpitations, orthopnea and PND.  Gastrointestinal: Positive for diarrhea and blood in stool. Negative for nausea, vomiting and abdominal pain.  Genitourinary: Negative for urgency and frequency.  Musculoskeletal: Negative for back pain and joint pain.  Neurological: Negative for sensory change, speech change, focal weakness and weakness.  Psychiatric/Behavioral: Negative for depression and hallucinations. The patient is not nervous/anxious.   All other systems reviewed and are negative.  Tolerating Diet:cld Tolerating PT: not needed  DRUG ALLERGIES:   Allergies  Allergen Reactions  . Cephalosporins Other (See Comments)    Reaction:  Unknown   . Penicillins Other (See Comments)    Reaction:  Unknown     VITALS:  Blood pressure 116/68, pulse 77, temperature 97.7 F (36.5 C), temperature source Oral, resp. rate 17, height  (1.778 m), weight 170 lb (77.111 kg), SpO2 100 %.  PHYSICAL EXAMINATION:   Physical Exam  GENERAL:  54 y.o.-year-old patient lying in the bed with no acute distress.  EYES: Pupils equal, round, reactive to light and accommodation. No scleral icterus. Extraocular muscles intact.  HEENT: Head atraumatic, normocephalic. Oropharynx and nasopharynx clear.  NECK:  Supple, no jugular venous distention. No thyroid enlargement, no tenderness.  LUNGS: Normal breath sounds bilaterally,  no wheezing, rales, rhonchi. No use of accessory muscles of respiration.  CARDIOVASCULAR: S1, S2 normal. No murmurs, rubs, or gallops.  ABDOMEN: Soft, nontender, nondistended. Bowel sounds present. No organomegaly or mass.  EXTREMITIES: No cyanosis, clubbing or edema b/l.    NEUROLOGIC: Cranial nerves II through XII are intact. No focal Motor or sensory deficits b/l.   PSYCHIATRIC: The patient is alert and oriented x 3.  SKIN: No obvious rash, lesion, or ulcer.    LABORATORY PANEL:   CBC  Recent Labs Lab 11/23/15 0425  WBC 2.7*  HGB 8.7*  HCT 28.8*  PLT 290    Chemistries   Recent Labs Lab 11/22/15 1203 11/23/15 0425  NA 138 134*  K 3.6 4.1  CL 107 109  CO2 26 22  GLUCOSE 100* 147*  BUN 8 <5*  CREATININE 0.91 0.71  CALCIUM 8.5* 7.9*  AST 15  --   ALT 12*  --   ALKPHOS 61  --   BILITOT 0.4  --     Cardiac Enzymes No results for input(s): TROPONINI in the last 168 hours.  RADIOLOGY:  Ct Abdomen Pelvis W Contrast  11/22/2015  CLINICAL DATA:  Rectal bleeding for 3 weeks. EXAM: CT ABDOMEN AND PELVIS WITH CONTRAST TECHNIQUE: Multidetector CT imaging of the abdomen and pelvis was performed using the standard protocol following bolus administration of intravenous contrast. CONTRAST:  OMNIPAQUE IOHEXOL 300 MG/ML  SOLN COMPARISON:  None. FINDINGS: Mild multilevel degenerative disc disease is noted in the lumbar spine. Visualized lung bases are unremarkable. No gallstones are noted. The liver, spleen and pancreas appear normal. Adrenal glands appear normal. No hydronephrosis or renal obstruction is noted. No  renal or ureteral calculi are noted. Atherosclerosis of abdominal aorta is noted without aneurysm formation. There is no evidence of bowel obstruction. There is noted diffuse wall thickening of the colon, particularly in the sigmoid region, consistent with infectious or inflammatory colitis. No abnormal fluid collection is noted. Urinary bladder appears normal. No  significant adenopathy is noted. IMPRESSION: Atherosclerosis of abdominal aorta without aneurysm formation. Diffuse wall thickening with mild surrounding inflammation is seen in the colon, particularly in the sigmoid region, consistent with inflammatory or infectious colitis. Electronically Signed   By: Lupita RaiderJames  Green Jr, M.D.   On: 11/22/2015 14:45   ASSESSMENT AND PLAN:   54 year old male with past medical history of ulcerative colitis, hypertension, hyperlipidemia, history of previous CVA who presents to the hospital due to worsening diarrhea and rectal bleeding.  #1 ulcerative colitis with rectal bleeding-patient seems to have a flareup of his colitis presently. -His hemoglobin is also stable and his CT scan of abdomen pelvis does not show any evidence of acute fluid collection or abscess. - on IV steroids, IV ciprofloxacin and Flagyl. Cont. Colazal. - will get a gastroenterology consult. -Place him on a clear liquid diet and follow him clinically.  #2 diarrhea-this is likely secondary to the ulcerative colitis. -stool neg C. difficile. Place on clear liquid diet and continue supportive care.  #3 hypertension-continue metoprolol.  #4 hyperlipidemia-continue atorvastatin.  #5 chronic anemia-secondary to the underlying colitis. - Hemoglobin stable no acute need for transfusion presently.  #6 history of previous CVA-continue Plavix, statin.  Case discussed with Care Management/Social Worker. Management plans discussed with the patient, family and they are in agreement.  CODE STATUS: full  DVT Prophylaxis: ambulatory,scd,teds  TOTAL TIME TAKING CARE OF THIS PATIENT: *30minutes.  >50% time spent on counselling and coordination of care   Almyra Birman M.D on 11/23/2015 at 4:46 PM  Between 7am to 6pm - Pager - [phone]  After 6pm go to [email or web address] - password [email or web address] [facility]  [place] [facility]  Office  [phone]  CC: Primary care physician; Domenic SchwabLindley,Cheryl Paulette,  FNP

## 2015-11-23 NOTE — Consult Note (Signed)
Consultation  Referring Provider:     Dr Quentin Cornwall Admit date: 11/22/2015 Consult date : 11/23/15       Reason for Consultation:     UC flare         HPI:   [name] is a 54 y.o. male with history of UC, htn/hl, history of heavy etoh use, CVA on plavix therapy, admitted with abdominal pain/ diarrhea/rectal bleeding over the last few weeks.  Originally diagnosed with UC several years ago at [facility] by Dr Jessee Avers- last seen 2/16 by this provider-  . Had a flare treated with prednisone earlier this year- has had multiple flares over the years, but was not considered for immunomodulator therapy due to adherence issues. There were noted some financial difficulties with medication, however he was able to receive these through the [facility] pharmacy benefit program, so cost was not an issue for him in this regard. Has been on delzicol-  tid per chart review. Follows now with Dr Charlcie Cradle NP at [facility] GI clinic. Last seen 07/31/15 and was arranged to have colonoscopy for UC/colon cancer surveillance and EGD to evaluate GI causes for dysphagia (started after CVA 2-3y ago). Was noted he was feeling well in his abdomen at the time. It also was noted he has had difficulty with medical adherence in the past. Colonsocopy was set up for 10/6. However this was not done and he missed his follow up in the clinic afterwards. It appears his last colonoscopy was 2005 with some inflammatory changes in the left sided colon consistent with UC- there were multiple polypoid lesions and pseudopolyps although I cannot access access the biopsy reports. In further review, it has been recommended for him to have repeat colonoscopy in the past but he has cancelled these procedures on multiple occasions.  Patient reports onset of diarrhea and rectal bleeding either 2w or 59m ago. States about 10 loose stools/d. Denies abdominal pain. States he has had weight loss, but unable to quantify. Denies NSAIDs. States no etoh in several weeks. No  changes to his reported dysphagia since his last CVA 2-73yr ago. Denies dyspepsia, NV, loss of appetite, further GI complaints. States he takes his UC meds everyday but is unable to state what meds he is taking. Does not know if he is on plavix or not, unable to state the rest of him medications.  Did have a Negative cdiff test yesterday. CT abdomen/pelvis with colitis, particularly in the sigmoid area. On cipro/flagyl/solumedrol. Has had fewer stools today per nurse report, does continue with some rectal bleeding.  I did discuss his UC with him and care requirements. States he did not have his colonoscopy/EGD last October because he was at his girlfriend's and he does not like colonoscopies.   Past Medical History  Diagnosis Date  . Stroke (HCC)   . Hypertension   . Hyperlipidemia   . Ulcerative colitis (HCC)     History reviewed. No pertinent past surgical history.  Family History  Problem Relation Age of Onset  . Breast cancer Mother    there is no family  history of colorectal cancer, colon polyps, liver disease, PUD.  Social History  Substance Use Topics  . Smoking status: Never Smoker   . Smokeless tobacco: Never Used  . Alcohol Use: No    Prior to Admission medications   Medication Sig Start Date End Date Taking? Authorizing Provider  atorvastatin (LIPITOR) 20 MG tablet Take 20 mg by mouth at bedtime.    Yes Historical Provider,  MD  balsalazide (COLAZAL) 750 MG capsule Take 750 mg by mouth 3 (three) times daily.   Yes Historical Provider, MD  Cholecalciferol (VITAMIN D3) 5000 UNITS TABS Take 5,000 Units by mouth daily.   Yes Historical Provider, MD  clopidogrel (PLAVIX) 75 MG tablet Take 75 mg by mouth daily.   Yes Historical Provider, MD  ferrous sulfate 325 (65 FE) MG tablet Take 325 mg by mouth daily.    Yes Historical Provider, MD  metoprolol succinate (TOPROL-XL) 25 MG 24 hr tablet Take 12.5 mg by mouth daily.    Yes Historical Provider, MD  traZODone (DESYREL) 50 MG  tablet Take 50 mg by mouth at bedtime as needed for sleep.   Yes Historical Provider, MD    Current Facility-Administered Medications  Medication Dose Route Frequency Provider Last Rate Last Dose  . 0.9 % NaCl with KCl 20 mEq/ L  infusion   Intravenous Continuous Katharina Caper, MD 125 mL/hr at 11/23/15 0250    . acetaminophen (TYLENOL) tablet 650 mg  650 mg Oral Q6H PRN Houston Siren, MD       Or  . acetaminophen (TYLENOL) suppository 650 mg  650 mg Rectal Q6H PRN Houston Siren, MD      . atorvastatin (LIPITOR) tablet 20 mg  20 mg Oral QHS Katharina Caper, MD   20 mg at 11/22/15 1959  . balsalazide (COLAZAL) capsule 750 mg  750 mg Oral TID Katharina Caper, MD   750 mg at 11/23/15 1056  . cholecalciferol (VITAMIN D) tablet 5,000 Units  5,000 Units Oral Daily Katharina Caper, MD   5,000 Units at 11/23/15 1054  . ciprofloxacin (CIPRO) IVPB 400 mg  400 mg Intravenous Q12H Houston Siren, MD   400 mg at 11/23/15 1056  . clopidogrel (PLAVIX) tablet 75 mg  75 mg Oral Daily Katharina Caper, MD   75 mg at 11/23/15 1054  . ferrous sulfate tablet 325 mg  325 mg Oral Daily Katharina Caper, MD   325 mg at 11/23/15 1054  . HYDROcodone-acetaminophen (NORCO/VICODIN) 5-325 MG per tablet 1-2 tablet  1-2 tablet Oral Q4H PRN Katharina Caper, MD      . methylPREDNISolone sodium succinate (SOLU-MEDROL) 40 mg/mL injection 40 mg  40 mg Intravenous Q12H Houston Siren, MD   40 mg at 11/23/15 0650  . metoprolol succinate (TOPROL-XL) 24 hr tablet 12.5 mg  12.5 mg Oral Daily Katharina Caper, MD   12.5 mg at 11/23/15 1054  . metroNIDAZOLE (FLAGYL) IVPB 500 mg  500 mg Intravenous Q8H Houston Siren, MD   500 mg at 11/23/15 1056  . morphine 2 MG/ML injection 1 mg  1 mg Intravenous Q4H PRN Houston Siren, MD      . ondansetron (ZOFRAN) tablet 4 mg  4 mg Oral Q6H PRN Houston Siren, MD       Or  . ondansetron (ZOFRAN) injection 4 mg  4 mg Intravenous Q6H PRN Houston Siren, MD      . traZODone (DESYREL) tablet 50 mg  50 mg Oral  QHS PRN Katharina Caper, MD   50 mg at 11/22/15 2255    Allergies as of 11/22/2015 - Review Complete 11/22/2015  Allergen Reaction Noted  . Cephalosporins Other (See Comments) 09/13/2015  . Penicillins Other (See Comments) 09/13/2015     Review of Systems:    All systems reviewed and negative except where noted in HPI.  Marland Kitchen    Physical Exam:  Vital signs in last 24 hours: Temp:  [  97.5 F (36.4 C)-97.7 F (36.5 C)] 97.7 F (36.5 C) (12/15 1353) Pulse Rate:  [66-88] 77 (12/15 1353) Resp:  [16-17] 17 (12/15 1353) BP: (108-127)/(66-84) 116/68 mmHg (12/15 1353) SpO2:  [100 %] 100 % (12/15 1353) Weight:  [77.111 kg (170 lb)] 77.111 kg (170 lb) (12/14 1840) Last BM Date: 11/22/15 General:   Pleasant man  in NAD Head:  Normocephalic and atraumatic. Eyes:   No icterus.   Conjunctiva pink. Ears:  Normal auditory acuity. Mouth: Mucosa pink moist, no lesions. Neck:  Supple; no masses felt Lungs:  Respirations even and unlabored. Lungs clear to auscultation bilaterally.   No wheezes, crackles, or rhonchi.  Heart:  S1S2, RRR, no MRG. No edema. Abdomen:   Flat, soft, nondistended, nontender. Normal bowel sounds. No appreciable masses or hepatomegaly. No rebound signs or other peritoneal signs..  Msk:  MAEW x4, No clubbing or cyanosis. Strength 5/5. Symmetrical without gross deformities. Neurologic:  Alert and  oriented x4;  Cranial nerves II-XII intact. There does appear to be some expressive aphasia. Skin:  Warm, dry, pink without significant lesions or rashes. Psych:  Alert and cooperative. Normal affect. Limited judgement.  LAB RESULTS:  Recent Labs  11/22/15 1203 11/23/15 0425  WBC 7.4 2.7*  HGB 8.8* 8.7*  HCT 29.7* 28.8*  PLT 340 290   BMET  Recent Labs  11/22/15 1203 11/23/15 0425  NA 138 134*  K 3.6 4.1  CL 107 109  CO2 26 22  GLUCOSE 100* 147*  BUN 8 <5*  CREATININE 0.91 0.71  CALCIUM 8.5* 7.9*   LFT  Recent Labs  11/22/15 1203  PROT 6.4*  ALBUMIN 2.8*   AST 15  ALT 12*  ALKPHOS 61  BILITOT 0.4   PT/INR  Recent Labs  11/22/15 1202  LABPROT 13.3  INR 0.99    STUDIES: Ct Abdomen Pelvis W Contrast  11/22/2015  CLINICAL DATA:  Rectal bleeding for 3 weeks. EXAM: CT ABDOMEN AND PELVIS WITH CONTRAST TECHNIQUE: Multidetector CT imaging of the abdomen and pelvis was performed using the standard protocol following bolus administration of intravenous contrast. CONTRAST:  100mL OMNIPAQUE IOHEXOL 300 MG/ML  SOLN COMPARISON:  None. FINDINGS: Mild multilevel degenerative disc disease is noted in the lumbar spine. Visualized lung bases are unremarkable. No gallstones are noted. The liver, spleen and pancreas appear normal. Adrenal glands appear normal. No hydronephrosis or renal obstruction is noted. No renal or ureteral calculi are noted. Atherosclerosis of abdominal aorta is noted without aneurysm formation. There is no evidence of bowel obstruction. There is noted diffuse wall thickening of the colon, particularly in the sigmoid region, consistent with infectious or inflammatory colitis. No abnormal fluid collection is noted. Urinary bladder appears normal. No significant adenopathy is noted. IMPRESSION: Atherosclerosis of abdominal aorta without aneurysm formation. Diffuse wall thickening with mild surrounding inflammation is seen in the colon, particularly in the sigmoid region, consistent with inflammatory or infectious colitis. Electronically Signed   By: Lupita RaiderJames  Green Jr, M.D.   On: 11/22/2015 14:45       Impression / Plan:   1. UC flare. Agree with current treatment of antibiotics/steroids. Seems to be responding to therapy. I suspect he is not adherent to his medical therapy at home, and he is not adherent with his colon surveillance.  Discussed medical adherence with him and routine necessary care in the treatment of UC. Explained why colonoscopy is necessary in the treatment of IBD and the increased risk of colon cancer. He should have a  colonoscopy, however  his last dose of Plavix was yesterday- this would need to be held for 5d prior to that exam. Will discuss further with Dr Marva Panda. Further recommendations to follow.  Thank you very much for this consult. These services were provided by Vevelyn Pat, NP-C, in collaboration with Christena Deem, MD, with whom I have discussed this patient in full.   Vevelyn Pat, NP-C

## 2015-11-23 NOTE — Consult Note (Signed)
Patient seen and examined, chart reviewed. We see full GI consult by Ms. London. Patient admitted with rectal bleeding watery stools and weakness. He is hemodynamically stable. Patient has a history of stroke and has been on Plavix.  Agree with current antibiotics. Recommend change of Solu-Medrol to 10 mg every 6 hours IV to be changed to prednisone 40 mg once daily by mouth on discharge.  Likely due to patient's history of stroke he has a limited ability for cooperation and understanding. However I feel that he can be followed up as outpatient to complete further evaluation. He would need to be off the Plavix for about 5-6 days prior to luminal evaluation which at this point is a very necessary. His last colonoscopy was in 2005 and he is at relatively higher risk for colon cancer. He has not been cooperative with previously arranged outpatient evaluations. He will require close follow-up in the GI outpatient clinic after discharge to properly monitor tapering of the prednisone and arrangement of procedures.  Following.

## 2015-11-23 NOTE — Clinical Social Work Note (Signed)
Clinical Social Work Assessment  Patient Details  Name: [name] MRN: [id number] Date of Birth: 11/19/1961  Date of referral:  11/23/15               Reason for consult:  Discharge Planning                Permission sought to share information with:    Permission granted to share information::     Name::        Agency::     Relationship::     Contact Information:     Housing/Transportation Living arrangements for the past 2 months:  Single Family Home Source of Information:  Patient Patient Interpreter Needed:  None Criminal Activity/Legal Involvement Pertinent to Current Situation/Hospitalization:  No - Comment as needed Significant Relationships:  Parents Lives with:  Parents Do you feel safe going back to the place where you live?  Yes Need for family participation in patient care:  No (Coment)  Care giving concerns:  Patient lives in a home with his father.   Social Worker assessment / plan:  CSW consulted by nursing staff stating that patient has had a history of a stroke and has to care for his father at home. CSW spoke with patient this afternoon and he informed me that his father is independent in all ADL's and still drives. Patient reports that his father assists with making sure he has what he needs as he does not drive. Patient reports having a stroke that has affected both arms but that he is able to feed himself, bathe himself, and dress himself and go to the bathroom independently. Patient reports his father takes him to get groceries and medications. Patient reports "all I really need is someone to clean my house for me." CSW explained that this would be a pay out of pocket service and that this is not covered by insurance. Patient stated he would like resources. CSW informed RN CM.  Employment status:  Disabled (Comment on whether or not currently receiving Disability) Insurance information:  [place] Recommendations:  Not assessed at this  time Information / Referral to community resources:     Patient/Family's Response to care:  Patient pleasant and cooperative.  Patient/Family's Understanding of and Emotional Response to Diagnosis, Current Treatment, and Prognosis:  Patient realizes he has limitations from an old cva but is able to adapt.  Emotional Assessment Appearance:  Appears stated age Attitude/Demeanor/Rapport:   (pleasant and cooperative) Affect (typically observed):  Accepting, Adaptable, Calm Orientation:  Oriented to Self, Oriented to Place, Oriented to  Time, Oriented to Situation Alcohol / Substance use:  Not Applicable Psych involvement (Current and /or in the community):  No (Comment)  Discharge Needs  Concerns to be addressed:  No discharge needs identified Readmission within the last 30 days:  No Current discharge risk:  None Barriers to Discharge:  No Barriers Identified   York SpanielMonica Lucely Leard, LCSW 11/23/2015, 4:25 PM

## 2015-11-24 MED ORDER — METRONIDAZOLE 500 MG PO TABS
500.0000 mg | ORAL_TABLET | Freq: Three times a day (TID) | ORAL | Status: DC
  Administered 2015-11-24: 500 mg via ORAL
  Filled 2015-11-24: qty 1

## 2015-11-24 MED ORDER — PREDNISONE 50 MG PO TABS: ORAL_TABLET | ORAL | Status: DC

## 2015-11-24 MED ORDER — CIPROFLOXACIN HCL 500 MG PO TABS: 500.0000 mg | ORAL_TABLET | Freq: Two times a day (BID) | ORAL | Status: DC

## 2015-11-24 MED ORDER — CIPROFLOXACIN HCL 500 MG PO TABS
500.0000 mg | ORAL_TABLET | Freq: Two times a day (BID) | ORAL | Status: DC
  Administered 2015-11-24: 500 mg via ORAL
  Filled 2015-11-24: qty 1

## 2015-11-24 MED ORDER — METRONIDAZOLE 500 MG PO TABS: 500.0000 mg | ORAL_TABLET | Freq: Three times a day (TID) | ORAL | Status: DC

## 2015-11-24 MED ORDER — PREDNISONE 50 MG PO TABS
50.0000 mg | ORAL_TABLET | Freq: Every day | ORAL | Status: DC
  Administered 2015-11-24: 50 mg via ORAL
  Filled 2015-11-24: qty 1

## 2015-11-24 NOTE — Progress Notes (Signed)
Pt d/c home; d/c instructions reviewed w/ pt; pt understanding was verbalized; IV removed catheter in tact, gauze dressing applied; all pt questions answered; pt left unit via wheelchair accompanied by staff 

## 2015-11-24 NOTE — Discharge Summary (Signed)
[facility]   PATIENT NAME: [name]    MR#:  [id number]  DATE OF BIRTH:  04/12/1961  DATE OF ADMISSION:  11/22/2015 ADMITTING PHYSICIAN: Katharina Caperima Vaickute, MD  DATE OF DISCHARGE: 11/24/15  PRIMARY CARE PHYSICIAN: Domenic SchwabLindley,Cheryl Paulette, FNP    ADMISSION DIAGNOSIS:  Lightheadedness [R42] Bloody diarrhea [A09] Other ulcerative colitis with complication (HCC) [K51.819]  DISCHARGE DIAGNOSIS:  Acute mild Ulcerative colitis flare(sigmoid)  SECONDARY DIAGNOSIS:   Past Medical History  Diagnosis Date  . Stroke (HCC)   . Hypertension   . Hyperlipidemia   . Ulcerative colitis [facility](HCC)     HOSPITAL COURSE:  54 year old male with past medical history of ulcerative colitis, hypertension, hyperlipidemia, history of previous CVA who presents to the hospital due to worsening diarrhea and rectal bleeding.  #1 ulcerative colitis with rectal bleeding-patient seems to have a flareup of his colitis presently. -His hemoglobin is also stable and his CT scan of abdomen pelvis does not show any evidence of acute fluid collection or abscess. - on IV steroids, IV ciprofloxacin and Flagyl. Cont.basalazide. - Appreciate dr skulskie's input ( gastroenterology consult.). Recommends po steroid 40 mg daily and out pt f/u for colonoscopy -soft diet.  #2 diarrhea-this is likely secondary to the ulcerative colitis. -stool neg C. Difficile.  #3 hypertension-continue metoprolol.  #4 hyperlipidemia-continue atorvastatin.  #5 chronic anemia-secondary to the underlying colitis. - Hemoglobin stable no acute need for transfusion presently.  #6 history of previous CVA-continue Plavix, statin. Overall doing well. D/c home CONSULTS OBTAINED:  Treatment Team:  Wallace CullensPaul Y Oh, MD  DRUG ALLERGIES:   Allergies  Allergen Reactions  . Cephalosporins Other (See Comments)    Reaction:  Unknown   . Penicillins Other (See Comments)    Reaction:  Unknown      DISCHARGE MEDICATIONS:   Current Discharge Medication List    CONTINUE these medications which have NOT CHANGED   Details  atorvastatin (LIPITOR) 20 MG tablet Take 20 mg by mouth at bedtime.     balsalazide (COLAZAL) 750 MG capsule Take 750 mg by mouth 3 (three) times daily.    Cholecalciferol (VITAMIN D3) 5000 UNITS TABS Take 5,000 Units by mouth daily.    clopidogrel (PLAVIX) 75 MG tablet Take 75 mg by mouth daily.    ferrous sulfate 325 (65 FE) MG tablet Take 325 mg by mouth daily.     metoprolol succinate (TOPROL-XL) 25 MG 24 hr tablet Take 12.5 mg by mouth daily.     traZODone (DESYREL) 50 MG tablet Take 50 mg by mouth at bedtime as needed for sleep.        If you experience worsening of your admission symptoms, develop shortness of breath, life threatening emergency, suicidal or homicidal thoughts you must seek medical attention immediately by calling 911 or calling your MD immediately  if symptoms less severe.  You Must read complete instructions/literature along with all the possible adverse reactions/side effects for all the Medicines you take and that have been prescribed to you. Take any new Medicines after you have completely understood and accept all the possible adverse reactions/side effects.   Please note  You were cared for by a hospitalist during your hospital stay. If you have any questions about your discharge medications or the care you received while you were in the hospital after you are discharged, you can call the unit and asked to speak with the hospitalist on call if the hospitalist that took care of you is not available.  Once you are discharged, your primary care physician will handle any further medical issues. Please note that NO REFILLS for any discharge medications will be authorized once you are discharged, as it is imperative that you return to your primary care physician (or establish a relationship with a primary care physician if you do not have  one) for your aftercare needs so that they can reassess your need for medications and monitor your lab values. Today   SUBJECTIVE   i want soft diet. i am about to go crazy!  VITAL SIGNS:  Blood pressure 93/56, pulse 71, temperature 97.9 F (36.6 C), temperature source Oral, resp. rate 24, height  (1.778 m), weight 170 lb (77.111 kg), SpO2 100 %.  I/O:   Intake/Output Summary (Last 24 hours) at 11/24/15 0958 Last data filed at 11/24/15 0356  Gross per 24 hour  Intake   2499 ml  Output      0 ml  Net   2499 ml    PHYSICAL EXAMINATION:  GENERAL:  54 y.o.-year-old patient lying in the bed with no acute distress.  EYES: Pupils equal, round, reactive to light and accommodation. No scleral icterus. Extraocular muscles intact.  HEENT: Head atraumatic, normocephalic. Oropharynx and nasopharynx clear.  NECK:  Supple, no jugular venous distention. No thyroid enlargement, no tenderness.  LUNGS: Normal breath sounds bilaterally, no wheezing, rales,rhonchi or crepitation. No use of accessory muscles of respiration.  CARDIOVASCULAR: S1, S2 normal. No murmurs, rubs, or gallops.  ABDOMEN: Soft, non-tender, non-distended. Bowel sounds present. No organomegaly or mass.  EXTREMITIES: No pedal edema, cyanosis, or clubbing.  NEUROLOGIC: Cranial nerves II through XII are intact. Muscle strength 5/5 in all extremities. Sensation intact. Gait not checked.  PSYCHIATRIC: The patient is alert and oriented x 3.  SKIN: No obvious rash, lesion, or ulcer.   DATA REVIEW:   CBC   Recent Labs Lab 11/23/15 0425  WBC 2.7*  HGB 8.7*  HCT 28.8*  PLT 290    Chemistries   Recent Labs Lab 11/22/15 1203 11/23/15 0425  NA 138 134*  K 3.6 4.1  CL 107 109  CO2 26 22  GLUCOSE 100* 147*  BUN 8 <5*  CREATININE 0.91 0.71  CALCIUM 8.5* 7.9*  AST 15  --   ALT 12*  --   ALKPHOS 61  --   BILITOT 0.4  --     Microbiology Results   Recent Results (from the past 240 hour(s))  C difficile quick  scan w PCR reflex     Status: None   Collection Time: 11/22/15  3:35 PM  Result Value Ref Range Status   C Diff antigen NEGATIVE NEGATIVE Final   C Diff toxin NEGATIVE NEGATIVE Final   C Diff interpretation Negative for C. difficile  Final    RADIOLOGY:  Ct Abdomen Pelvis W Contrast  11/22/2015  CLINICAL DATA:  Rectal bleeding for 3 weeks. EXAM: CT ABDOMEN AND PELVIS WITH CONTRAST TECHNIQUE: Multidetector CT imaging of the abdomen and pelvis was performed using the standard protocol following bolus administration of intravenous contrast. CONTRAST:  OMNIPAQUE IOHEXOL 300 MG/ML  SOLN COMPARISON:  None. FINDINGS: Mild multilevel degenerative disc disease is noted in the lumbar spine. Visualized lung bases are unremarkable. No gallstones are noted. The liver, spleen and pancreas appear normal. Adrenal glands appear normal. No hydronephrosis or renal obstruction is noted. No renal or ureteral calculi are noted. Atherosclerosis of abdominal aorta is noted without aneurysm formation. There is no evidence of bowel obstruction.  There is noted diffuse wall thickening of the colon, particularly in the sigmoid region, consistent with infectious or inflammatory colitis. No abnormal fluid collection is noted. Urinary bladder appears normal. No significant adenopathy is noted. IMPRESSION: Atherosclerosis of abdominal aorta without aneurysm formation. Diffuse wall thickening with mild surrounding inflammation is seen in the colon, particularly in the sigmoid region, consistent with inflammatory or infectious colitis. Electronically Signed   By: Lupita Raider, M.D.   On: 11/22/2015 14:45     Management plans discussed with the patient, family and they are in agreement.  CODE STATUS:     Code Status Orders        Start     Ordered   11/22/15 1840  Full code   Continuous     11/22/15 1839      TOTAL TIME TAKING CARE OF THIS PATIENT: 40 minutes.    Raymund Manrique M.D on 11/24/2015 at 9:58  AM  Between 7am to 6pm - Pager - [phone] After 6pm go to [email or web address] - password [email or web address] [facility]  [facility]  Office  [phone]  CC: Primary care physician; Domenic Schwab, FNP

## 2015-11-24 NOTE — Discharge Instructions (Signed)
Follow all MD discharge instructions. Take all medications as prescribed. Keep all follow up appointments. If your symptoms return, call your doctor. If you experience any new symptoms that are of concern to you or that are bothersome to you, call your doctor. For all questions and/or concerns, call your doctor. ° ° If you have a medical emergency, call 911 ° ° ° °

## 2015-11-29 LAB — CMV DNA BY PCR, QUALITATIVE: CMV DNA QL PCR: NEGATIVE

## 2015-12-09 ENCOUNTER — Encounter: Payer: Self-pay | Admitting: Emergency Medicine

## 2015-12-09 ENCOUNTER — Inpatient Hospital Stay
Admission: EM | Admit: 2015-12-09 | Discharge: 2015-12-10 | DRG: 386 | Disposition: A | Discharge status: Home / Self Care | Payer: Medicaid Other | Attending: Internal Medicine | Admitting: Internal Medicine

## 2015-12-09 DIAGNOSIS — D72819 Decreased white blood cell count, unspecified: Secondary | ICD-10-CM | POA: Diagnosis present

## 2015-12-09 DIAGNOSIS — K5190 Ulcerative colitis, unspecified, without complications: Secondary | ICD-10-CM

## 2015-12-09 DIAGNOSIS — E871 Hypo-osmolality and hyponatremia: Secondary | ICD-10-CM | POA: Diagnosis present

## 2015-12-09 DIAGNOSIS — Z881 Allergy status to other antibiotic agents status: Secondary | ICD-10-CM | POA: Diagnosis not present

## 2015-12-09 DIAGNOSIS — R197 Diarrhea, unspecified: Secondary | ICD-10-CM

## 2015-12-09 DIAGNOSIS — Z803 Family history of malignant neoplasm of breast: Secondary | ICD-10-CM

## 2015-12-09 DIAGNOSIS — K51911 Ulcerative colitis, unspecified with rectal bleeding: Secondary | ICD-10-CM | POA: Diagnosis present

## 2015-12-09 DIAGNOSIS — E785 Hyperlipidemia, unspecified: Secondary | ICD-10-CM | POA: Diagnosis present

## 2015-12-09 DIAGNOSIS — E861 Hypovolemia: Secondary | ICD-10-CM | POA: Diagnosis present

## 2015-12-09 DIAGNOSIS — D62 Acute posthemorrhagic anemia: Secondary | ICD-10-CM | POA: Diagnosis present

## 2015-12-09 DIAGNOSIS — Z8673 Personal history of transient ischemic attack (TIA), and cerebral infarction without residual deficits: Secondary | ICD-10-CM

## 2015-12-09 DIAGNOSIS — I10 Essential (primary) hypertension: Secondary | ICD-10-CM | POA: Diagnosis present

## 2015-12-09 DIAGNOSIS — Z833 Family history of diabetes mellitus: Secondary | ICD-10-CM

## 2015-12-09 DIAGNOSIS — Z8249 Family history of ischemic heart disease and other diseases of the circulatory system: Secondary | ICD-10-CM | POA: Diagnosis not present

## 2015-12-09 DIAGNOSIS — R739 Hyperglycemia, unspecified: Secondary | ICD-10-CM | POA: Diagnosis present

## 2015-12-09 DIAGNOSIS — K922 Gastrointestinal hemorrhage, unspecified: Secondary | ICD-10-CM | POA: Diagnosis present

## 2015-12-09 LAB — COMPREHENSIVE METABOLIC PANEL
ALBUMIN: 2.5 g/dL — AB (ref 3.5–5.0)
ALK PHOS: 45 U/L (ref 38–126)
ALT: 6 U/L — ABNORMAL LOW (ref 17–63)
AST: 11 U/L — AB (ref 15–41)
Anion gap: 4 — ABNORMAL LOW (ref 5–15)
BILIRUBIN TOTAL: 0.6 mg/dL (ref 0.3–1.2)
BUN: 9 mg/dL (ref 6–20)
CALCIUM: 8 mg/dL — AB (ref 8.9–10.3)
CO2: 23 mmol/L (ref 22–32)
Chloride: 110 mmol/L (ref 101–111)
Creatinine, Ser: 0.81 mg/dL (ref 0.61–1.24)
GFR calc Af Amer: >60 mL/min (ref >60)
GFR calc non Af Amer: >60 mL/min (ref >60)
GLUCOSE: 122 mg/dL — AB (ref 65–99)
Potassium: 3.7 mmol/L (ref 3.5–5.1)
Sodium: 137 mmol/L (ref 135–145)
TOTAL PROTEIN: 5.1 g/dL — AB (ref 6.5–8.1)

## 2015-12-09 LAB — CBC
HEMATOCRIT: 25.7 % — AB (ref 40.0–52.0)
Hemoglobin: 7.7 g/dL — ABNORMAL LOW (ref 13.0–18.0)
MCH: 19.4 pg — AB (ref 26.0–34.0)
MCHC: 30 g/dL — AB (ref 32.0–36.0)
MCV: 64.6 fL — ABNORMAL LOW (ref 80.0–100.0)
Platelets: 217 10*3/uL (ref 150–440)
RBC: 3.98 MIL/uL — ABNORMAL LOW (ref 4.40–5.90)
RDW: 21.6 % — AB (ref 11.5–14.5)
WBC: 5.7 10*3/uL (ref 3.8–10.6)

## 2015-12-09 LAB — FERRITIN: Ferritin: 6 ng/mL — ABNORMAL LOW (ref 24–336)

## 2015-12-09 LAB — PREPARE RBC (CROSSMATCH)

## 2015-12-09 MED ORDER — FUROSEMIDE 10 MG/ML IJ SOLN
20.0000 mg | Freq: Once | INTRAMUSCULAR | Status: AC
  Administered 2015-12-09: 20 mg via INTRAVENOUS
  Filled 2015-12-09: qty 2

## 2015-12-09 MED ORDER — TRAZODONE HCL 50 MG PO TABS: 50.0000 mg | ORAL_TABLET | Freq: Every evening | ORAL | Status: DC | PRN

## 2015-12-09 MED ORDER — VITAMIN D3 25 MCG (1000 UNIT) PO TABS
5000.0000 [IU] | ORAL_TABLET | Freq: Every day | ORAL | Status: DC
  Administered 2015-12-10: 5000 [IU] via ORAL
  Filled 2015-12-09 (×2): qty 5

## 2015-12-09 MED ORDER — SODIUM CHLORIDE 0.9 % IV SOLN: 10.0000 mL/h | Freq: Once | INTRAVENOUS | Status: DC

## 2015-12-09 MED ORDER — ATORVASTATIN CALCIUM 20 MG PO TABS
20.0000 mg | ORAL_TABLET | Freq: Every day | ORAL | Status: DC
  Administered 2015-12-09: 20 mg via ORAL
  Filled 2015-12-09: qty 1

## 2015-12-09 MED ORDER — FUROSEMIDE 10 MG/ML IJ SOLN: 40.0000 mg | Freq: Once | INTRAMUSCULAR | Status: DC

## 2015-12-09 MED ORDER — ACETAMINOPHEN 325 MG PO TABS: 650.0000 mg | ORAL_TABLET | Freq: Four times a day (QID) | ORAL | Status: DC | PRN

## 2015-12-09 MED ORDER — BALSALAZIDE DISODIUM 750 MG PO CAPS
750.0000 mg | ORAL_CAPSULE | Freq: Three times a day (TID) | ORAL | Status: DC
  Administered 2015-12-09 – 2015-12-10 (×3): 750 mg via ORAL
  Filled 2015-12-09 (×4): qty 1

## 2015-12-09 MED ORDER — ACETAMINOPHEN 650 MG RE SUPP: 650.0000 mg | Freq: Four times a day (QID) | RECTAL | Status: DC | PRN

## 2015-12-09 MED ORDER — SODIUM CHLORIDE 0.9 % IJ SOLN
3.0000 mL | Freq: Two times a day (BID) | INTRAMUSCULAR | Status: DC
  Administered 2015-12-10: 3 mL via INTRAVENOUS

## 2015-12-09 NOTE — ED Notes (Signed)
Pt signed blood consent and consent placed on chart.

## 2015-12-09 NOTE — Progress Notes (Signed)
Pt came to floor at 1810. VSS. Pt was oriented to room and safety plan.

## 2015-12-09 NOTE — ED Provider Notes (Signed)
Time Seen: Approximately ----------------------------------------- 1:49 PM on 12/09/2015 -----------------------------------------   I have reviewed the triage notes  Chief Complaint: Loss of Consciousness and Rectal Bleeding   History of Present Illness: [name] is a 54 y.o. male who was at [facility] today and apparently had a syncopal episode. Patient denies any injury from his syncope and states he felt lightheaded prior to the episode. Patient was noticed to have some dark red blood in his stool earlier today. EMS states patient's blood pressure at the scene was 69/40. The patient was moved to a chair and then passed out for another brief period of time. He was started on IV fluids and transported here to the emergency department. He has a significant history of bright red blood in his stool with a history of ulcerative colitis. Patient denies abdominal pain or fever. Patient himself is a somewhat poor historian.   Past Medical History  Diagnosis Date  . Stroke (HCC)   . Hypertension   . Hyperlipidemia   . Ulcerative colitis [facility])     Patient Active Problem List   Diagnosis Date Noted  . Exacerbation of ulcerative colitis (HCC) 11/22/2015  . Ulcerative colitis, chronic (HCC) 11/22/2015    History reviewed. No pertinent past surgical history.  History reviewed. No pertinent past surgical history.  Current Outpatient Rx  Name  Route  Sig  Dispense  Refill  . atorvastatin (LIPITOR) 20 MG tablet   Oral   Take 20 mg by mouth at bedtime.          . balsalazide (COLAZAL) 750 MG capsule   Oral   Take 750 mg by mouth 3 (three) times daily.         . Cholecalciferol (VITAMIN D3) 5000 UNITS TABS   Oral   Take 5,000 Units by mouth daily.         . ciprofloxacin (CIPRO) 500 MG tablet   Oral   Take 1 tablet (500 mg total) by mouth 2 (two) times daily.   18 tablet   0   . clopidogrel (PLAVIX) 75 MG tablet   Oral   Take 75 mg by mouth daily.         .  ferrous sulfate 325 (65 FE) MG tablet   Oral   Take 325 mg by mouth daily.          . metoprolol succinate (TOPROL-XL) 25 MG 24 hr tablet   Oral   Take 12.5 mg by mouth daily.          . metroNIDAZOLE (FLAGYL) 500 MG tablet   Oral   Take 1 tablet (500 mg total) by mouth every 8 (eight) hours.   27 tablet   0   . predniSONE (DELTASONE) 50 MG tablet      Take 40 mg po daily (2 tabs)   30 tablet   1   . traZODone (DESYREL) 50 MG tablet   Oral   Take 50 mg by mouth at bedtime as needed for sleep.           Allergies:  Cephalosporins and Penicillins  Family History: Family History  Problem Relation Age of Onset  . Breast cancer Mother     Social History: Social History  Substance Use Topics  . Smoking status: Never Smoker   . Smokeless tobacco: Never Used  . Alcohol Use: No     Review of Systems:   10 point review of systems was performed and was otherwise negative:  Constitutional:  No fever Eyes: No visual disturbances ENT: No sore throat, ear pain Cardiac: No chest pain Respiratory: No shortness of breath, wheezing, or stridor Abdomen: No abdominal pain, no vomiting, No diarrhea Endocrine: No weight loss, No night sweats Extremities: No peripheral edema, cyanosis Skin: No rashes, easy bruising Neurologic: No focal weakness, trouble with speech or swollowing Urologic: No dysuria, Hematuria, or urinary frequency  Physical Exam:  ED Triage Vitals  Enc Vitals Group     BP 12/09/15 1335 98/77 mmHg     Pulse Rate 12/09/15 1335 83     Resp 12/09/15 1335 20     Temp 12/09/15 1335 97.4 F (36.3 C)     Temp Source 12/09/15 1335 Oral     SpO2 12/09/15 1335 99 %     Weight 12/09/15 1335 160 lb (72.576 kg)     Height 12/09/15 1335 5\' 10"  (1.778 m)     Head Cir --      Peak Flow --      Pain Score 12/09/15 1337 0     Pain Loc --      Pain Edu? --      Excl. in GC? --     General: Awake , Alert , and Oriented times 3; GCS 15 Head: Normal cephalic  , atraumatic Eyes: Pupils equal , round, reactive to light Nose/Throat: No nasal drainage, patent upper airway without erythema or exudate.  Neck: Supple, Full range of motion, No anterior adenopathy or palpable thyroid masses Lungs: Clear to ascultation without wheezes , rhonchi, or rales Heart: Regular rate, regular rhythm without murmurs , gallops , or rubs Abdomen: Soft, non tender without rebound, guarding , or rigidity; bowel sounds positive and symmetric in all 4 quadrants. No organomegaly .        Extremities: 2 plus symmetric pulses. No edema, clubbing or cyanosis Neurologic: normal ambulation, Motor symmetric without deficits, sensory intact Skin: warm, dry, no rashes Rectal exam is guaiac positive with maroon-colored stool in the rectal vault. No masses are noted  Labs:   All laboratory work was reviewed including any pertinent negatives or positives listed below:  Labs Reviewed  COMPREHENSIVE METABOLIC PANEL  CBC  TYPE AND SCREEN    EKG:  ED ECG REPORT I, Jennye MoccasinBrian S Maximus Hoffert, the attending physician, personally viewed and interpreted this ECG.  Date: 12/09/2015 EKG Time: 1330 Rate: 83 Rhythm: normal sinus rhythm premature atrial complexes QRS Axis: normal Intervals: normal ST/T Wave abnormalities: Slightly prolonged QT interval Conduction Disutrbances: none Narrative Interpretation: unremarkable      Critical Care:  CRITICAL CARE Performed by: Jennye MoccasinBrian S Nabria Nevin   Total critical care time: 35 minutes  Critical care time was exclusive of separately billable procedures and treating other patients.  Critical care was necessary to treat or prevent imminent or life-threatening deterioration.  Critical care was time spent personally by me on the following activities: development of treatment plan with patient and/or surrogate as well as nursing, discussions with consultants, evaluation of patient's response to treatment, examination of patient, obtaining history from  patient or surrogate, ordering and performing treatments and interventions, ordering and review of laboratory studies, ordering and review of radiographic studies, pulse oximetry and re-evaluation of patient's condition. Patient's hemoglobin is dropped in the face of a lower gastrointestinal bleed I felt the patient required blood transfusion.   ED Course:  Patient established and received 2 units of blood. He was hypotensive upon arrival was blood pressure is increased gradually after IV fluids. Patient had 2 syncopal  episodes prior to arrival and I felt required observation as an inpatient.    Assessment: Acute lower gastrointestinal bleed      Plan:  Inpatient management I reviewed the case with the hospitalist team, further disposition and management depends upon their evaluation            Jennye Moccasin, MD 12/09/15 [phone]

## 2015-12-09 NOTE — ED Notes (Signed)
Spoke with lab and they have blood to add on the Ferritin.

## 2015-12-09 NOTE — ED Notes (Signed)
EMS states that pt noticed some dark red blood in stool today. Pt was recently seen and treated for a GI bleed. EMS states that they noted pt's blood pressure to be 69/40. They moved pt to a chair and pt passed out. EMS states they started an IV and gave pt fluids. Pt alert and oriented upon arrival to ER.

## 2015-12-09 NOTE — H&P (Signed)
[facility] - [place] at [facility]   PATIENT NAME: [name]    MR#:  [id number]  DATE OF BIRTH:  09-25-1961  DATE OF ADMISSION:  12/09/2015  PRIMARY CARE PHYSICIAN: Domenic Schwab, FNP   REQUESTING/REFERRING PHYSICIAN: Dr. Huel Cote  CHIEF COMPLAINT:   Chief Complaint  Patient presents with  . Loss of Consciousness  . Rectal Bleeding    HISTORY OF PRESENT ILLNESS:  [name]  is a 54 y.o. male with a known history of ulcerative colitis and previous stroke. He was recently in the hospital for colitis. At home he did lose some blood in the stool and he cannot quantify how much blood or what it looked like. At [facility] he also went to the bathroom and lost some blood. While he was at line in [place] checking out he wobbled back on his knees and ended up on the floor after a man lowered him down to the floor. He did lose consciousness shortly. They tried to sit him up and he lost consciousness again and that happened twice. With that out episode he did have another bowel movement with orange blood in it. He presented to the ER with relative hypotension which responded to a fluid bolus. He was found to be anemic and guaiac positive by the ER physician. And the ER physician ordered for blood transfusion.  PAST MEDICAL HISTORY:   Past Medical History  Diagnosis Date  . Stroke (HCC)   . Hypertension   . Hyperlipidemia   . Ulcerative colitis (HCC)     PAST SURGICAL HISTORY:   Past Surgical History  Procedure Laterality Date  . No past surgeries      SOCIAL HISTORY:   Social History  Substance Use Topics  . Smoking status: Never Smoker   . Smokeless tobacco: Never Used  . Alcohol Use: No    FAMILY HISTORY:   Family History  Problem Relation Age of Onset  . Breast cancer Mother   . Diabetes Father   . Hypertension Father   . Breast cancer Sister     DRUG ALLERGIES:   Allergies  Allergen Reactions  . Penicillins  Anaphylaxis    Has patient had a PCN reaction causing immediate rash, facial/tongue/throat swelling, SOB or lightheadedness with hypotension: Yes Has patient had a PCN reaction causing severe rash involving mucus membranes or skin necrosis: No Has patient had a PCN reaction that required hospitalization No Has patient had a PCN reaction occurring within the last 10 years: No If all of the above answers are "NO", then may proceed with Cephalosporin use.  . Cephalosporins Other (See Comments)    Reaction:  Unknown     REVIEW OF SYSTEMS:  CONSTITUTIONAL: No fever, positive for cold feeling. Positive for weight loss. Positive for generalized weakness.  EYES: No blurred or double vision. Wears reading glasses EARS, NOSE, AND THROAT: No tinnitus or ear pain. No sore throat. Left nose runs. RESPIRATORY: No cough. Positive for shortness of breath with exertion, no wheezing or hemoptysis.  CARDIOVASCULAR: No chest pain, orthopnea, edema.  GASTROINTESTINAL: No nausea, vomiting, diarrhea or abdominal pain. Positive for bright red blood per rectum GENITOURINARY: No dysuria, hematuria.  ENDOCRINE: No polyuria, nocturia,  HEMATOLOGY: History of anemia, no easy bruising or bleeding SKIN: No rash or lesion. MUSCULOSKELETAL: No joint pain or arthritis.   NEUROLOGIC: No tingling, numbness. Positive for syncope today PSYCHIATRY: No anxiety or depression.   MEDICATIONS AT HOME:   Prior to Admission medications   Medication  Sig Start Date End Date Taking? Authorizing Provider  atorvastatin (LIPITOR) 20 MG tablet Take 20 mg by mouth at bedtime.    Yes Historical Provider, MD  balsalazide (COLAZAL) 750 MG capsule Take 750 mg by mouth 3 (three) times daily.   Yes Historical Provider, MD  Cholecalciferol (VITAMIN D3) 5000 UNITS TABS Take 5,000 Units by mouth daily.   Yes Historical Provider, MD  clopidogrel (PLAVIX) 75 MG tablet Take 75 mg by mouth daily.   Yes Historical Provider, MD  metoprolol succinate  (TOPROL-XL) 25 MG 24 hr tablet Take 12.5 mg by mouth every morning.    Yes Historical Provider, MD  traZODone (DESYREL) 50 MG tablet Take 50 mg by mouth at bedtime as needed for sleep.   Yes Historical Provider, MD      VITAL SIGNS:  Blood pressure 111/79, pulse 73, temperature 97.4 F (36.3 C), temperature source Oral, resp. rate 14, height  (1.778 m), weight 72.576 kg (160 lb), SpO2 100 %.  PHYSICAL EXAMINATION:  GENERAL:  54 y.o.-year-old patient lying in the bed with no acute distress.  EYES: Pupils equal, round, reactive to light and accommodation. No scleral icterus. Extraocular muscles intact. Conjunctiva pale. HEENT: Head atraumatic, normocephalic. Oropharynx and nasopharynx clear.  NECK:  Supple, no jugular venous distention. No thyroid enlargement, no tenderness.  LUNGS: Normal breath sounds bilaterally, no wheezing, rales,rhonchi or crepitation. No use of accessory muscles of respiration.  CARDIOVASCULAR: S1, S2 normal. No murmurs, rubs, or gallops.  ABDOMEN: Soft, nontender, nondistended. Bowel sounds present. No organomegaly or mass. ER physician did rectal exam which was grossly guaiac positive EXTREMITIES: No pedal edema, cyanosis, or clubbing.  NEUROLOGIC: Cranial nerves II through XII are intact. Muscle strength 5/5 in all extremities. Sensation intact. Gait not checked.  PSYCHIATRIC: The patient is alert and oriented x 3.  SKIN: No rash, lesion, or ulcer.   LABORATORY PANEL:   CBC  Recent Labs Lab 12/09/15 1417  WBC 5.7  HGB 7.7*  HCT 25.7*  PLT 217   ------------------------------------------------------------------------------------------------------------------  Chemistries   Recent Labs Lab 12/09/15 1417  NA 137  K 3.7  CL 110  CO2 23  GLUCOSE 122*  BUN 9  CREATININE 0.81  CALCIUM 8.0*  AST 11*  ALT 6*  ALKPHOS 45  BILITOT 0.6    ------------------------------------------------------------------------------------------------------------------   EKG:   Sinus rhythm 83 bpm PVCs  IMPRESSION AND PLAN:   1. Acute blood loss anemia, likely a lower GI bleed. The patient has a history of ulcerative colitis. The ER physician ordered 2 units of blood. Since the patient's hemoglobin is stable and has not had further bowel movements in the ER admit to the floor at this point. Serial hemoglobins. Hold Plavix. GI consultation. I'll put on a full liquid diet for right now. 2. Repeated Syncope- likely from hypovolemia and hypotension. Monitor on telemetry. Hold Toprol 3. Ulcerative colitis- finished up antibiotics and prednisone recently from recent hospitalization. Continue Colazal 4. History of CVA- hold Plavix with bleeding, continue statin 5. Hyperlipidemia unspecified continue statin 6. Impaired fasting glucose- check a hemoglobin A1c  All the records are reviewed and case discussed with ED provider. Management plans discussed with the patient, family and they are in agreement.  CODE STATUS: Full code TOTAL TIME TAKING CARE OF THIS PATIENT: 55 minutes.   Alford Highland M.D on 12/09/2015 at 4:21 PM  Between 7am to 6pm - Pager - [phone]  After 6pm call admission pager [phone]  [facility]  Office  [phone]  CC: Primary care physician; Domenic SchwabLindley,Cheryl Paulette, FNP

## 2015-12-10 DIAGNOSIS — R197 Diarrhea, unspecified: Secondary | ICD-10-CM

## 2015-12-10 DIAGNOSIS — R739 Hyperglycemia, unspecified: Secondary | ICD-10-CM

## 2015-12-10 DIAGNOSIS — E871 Hypo-osmolality and hyponatremia: Secondary | ICD-10-CM

## 2015-12-10 DIAGNOSIS — K5190 Ulcerative colitis, unspecified, without complications: Secondary | ICD-10-CM

## 2015-12-10 DIAGNOSIS — D72819 Decreased white blood cell count, unspecified: Secondary | ICD-10-CM

## 2015-12-10 DIAGNOSIS — K922 Gastrointestinal hemorrhage, unspecified: Secondary | ICD-10-CM

## 2015-12-10 LAB — CBC
HCT: 33 % — ABNORMAL LOW (ref 40.0–52.0)
Hemoglobin: 10.5 g/dL — ABNORMAL LOW (ref 13.0–18.0)
MCH: 21.6 pg — AB (ref 26.0–34.0)
MCHC: 31.9 g/dL — ABNORMAL LOW (ref 32.0–36.0)
MCV: 67.7 fL — ABNORMAL LOW (ref 80.0–100.0)
PLATELETS: 223 10*3/uL (ref 150–440)
RBC: 4.88 MIL/uL (ref 4.40–5.90)
RDW: 23.4 % — AB (ref 11.5–14.5)
WBC: 5.9 10*3/uL (ref 3.8–10.6)

## 2015-12-10 LAB — TYPE AND SCREEN
ABO/RH(D): O POS
ANTIBODY SCREEN: NEGATIVE
Unit division: 0
Unit division: 0

## 2015-12-10 LAB — HEMOGLOBIN: Hemoglobin: 10.3 g/dL — ABNORMAL LOW (ref 13.0–18.0)

## 2015-12-10 LAB — BASIC METABOLIC PANEL
Anion gap: 4 — ABNORMAL LOW (ref 5–15)
BUN: 6 mg/dL (ref 6–20)
CALCIUM: 8.4 mg/dL — AB (ref 8.9–10.3)
CO2: 27 mmol/L (ref 22–32)
CREATININE: 0.8 mg/dL (ref 0.61–1.24)
Chloride: 107 mmol/L (ref 101–111)
GFR calc non Af Amer: >60 mL/min (ref >60)
Glucose, Bld: 91 mg/dL (ref 65–99)
Potassium: 4.3 mmol/L (ref 3.5–5.1)
SODIUM: 138 mmol/L (ref 135–145)

## 2015-12-10 LAB — HEMOGLOBIN AND HEMATOCRIT, BLOOD
HEMATOCRIT: 35.5 % — AB (ref 40.0–52.0)
Hemoglobin: 11.2 g/dL — ABNORMAL LOW (ref 13.0–18.0)

## 2015-12-10 MED ORDER — PANTOPRAZOLE SODIUM 40 MG IV SOLR
40.0000 mg | Freq: Two times a day (BID) | INTRAVENOUS | Status: DC
  Administered 2015-12-10: 40 mg via INTRAVENOUS
  Filled 2015-12-10: qty 40

## 2015-12-10 MED ORDER — PREDNISONE 10 MG PO TABS: 10.0000 mg | ORAL_TABLET | Freq: Every day | ORAL | Status: DC

## 2015-12-10 MED ORDER — SODIUM CHLORIDE 0.9 % IV SOLN
INTRAVENOUS | Status: DC
  Administered 2015-12-10: 15:00:00 via INTRAVENOUS

## 2015-12-10 NOTE — Progress Notes (Addendum)
[facility] - [place] at [facility]   PATIENT NAME: [name]    MR#:  [id number]  DATE OF BIRTH:  October 29, 1961  SUBJECTIVE:  CHIEF COMPLAINT:   Chief Complaint  Patient presents with  . Loss of Consciousness  . Rectal Bleeding   the patient is 55 year old Caucasian male with history of ulcerative colitis who presents to the hospital with complaints of rectal bleeding, worsening diarrhea, weakness. In emergency room, he was noted to be relatively hypotensive. His labs revealed hyponatremia and anemia ,  Leukopenia. Patient was rehydrated and his hemoglobin level dropped down to 7.7 after which he was transfused 2 units of packed red blood cells. Hemoglobin level improved to 10.5 and remained stable. Patient denies any more bleeding. He is on full liquid diet and requests to go home. Gastroenterologist to see patient in consultation  Review of Systems  Constitutional: Negative for fever, chills and weight loss.  HENT: Negative for congestion.   Eyes: Negative for blurred vision and double vision.  Respiratory: Negative for cough, sputum production, shortness of breath and wheezing.   Cardiovascular: Negative for chest pain, palpitations, orthopnea, leg swelling and PND.  Gastrointestinal: Positive for blood in stool. Negative for nausea, vomiting, abdominal pain, diarrhea and constipation.  Genitourinary: Negative for dysuria, urgency, frequency and hematuria.  Musculoskeletal: Negative for falls.  Neurological: Negative for dizziness, tremors, focal weakness and headaches.  Endo/Heme/Allergies: Does not bruise/bleed easily.  Psychiatric/Behavioral: Negative for depression. The patient does not have insomnia.     VITAL SIGNS: Blood pressure 118/70, pulse 70, temperature 98.2 F (36.8 C), temperature source Oral, resp. rate 18, height 5\' 10"  (1.778 m), weight 72.576 kg (160 lb), SpO2 100 %.  PHYSICAL EXAMINATION:   GENERAL:  55 y.o.-year-old patient lying  in the bed with no acute distress.  EYES: Pupils equal, round, reactive to light and accommodation. No scleral icterus. Extraocular muscles intact.  HEENT: Head atraumatic, normocephalic. Oropharynx and nasopharynx clear.  NECK:  Supple, no jugular venous distention. No thyroid enlargement, no tenderness.  LUNGS: Normal breath sounds bilaterally, no wheezing, rales,rhonchi or crepitation. No use of accessory muscles of respiration.  CARDIOVASCULAR: S1, S2 normal. No murmurs, rubs, or gallops.  ABDOMEN: Soft, mild discomfort in palpation on the right side of abdomen but no rebound , some voluntary guarding was noted, nondistended. Bowel sounds present. No organomegaly or mass.  EXTREMITIES: No pedal edema, cyanosis, or clubbing.  NEUROLOGIC: Cranial nerves II through XII are intact. Muscle strength 5/5 in all extremities. Sensation intact. Gait not checked.  PSYCHIATRIC: The patient is alert and oriented x 3.  SKIN: No obvious rash, lesion, or ulcer.   ORDERS/RESULTS REVIEWED:   CBC  Recent Labs Lab 12/09/15 1417 12/10/15 0057 12/10/15 0813  WBC 5.7  --  5.9  HGB 7.7* 10.3* 10.5*  HCT 25.7*  --  33.0*  PLT 217  --  223  MCV 64.6*  --  67.7*  MCH 19.4*  --  21.6*  MCHC 30.0*  --  31.9*  RDW 21.6*  --  23.4*   ------------------------------------------------------------------------------------------------------------------  Chemistries   Recent Labs Lab 12/09/15 1417 12/10/15 0813  NA 137 138  K 3.7 4.3  CL 110 107  CO2 23 27  GLUCOSE 122* 91  BUN 9 6  CREATININE 0.81 0.80  CALCIUM 8.0* 8.4*  AST 11*  --   ALT 6*  --   ALKPHOS 45  --   BILITOT 0.6  --    ------------------------------------------------------------------------------------------------------------------ estimated creatinine clearance  is 109.7 mL/min (by C-G formula based on Cr of 0.8). ------------------------------------------------------------------------------------------------------------------ No  results for input(s): TSH, T4TOTAL, T3FREE, THYROIDAB in the last 72 hours.  Invalid input(s): FREET3  Cardiac Enzymes No results for input(s): CKMB, TROPONINI, MYOGLOBIN in the last 168 hours.  Invalid input(s): CK ------------------------------------------------------------------------------------------------------------------ Invalid input(s): POCBNP ---------------------------------------------------------------------------------------------------------------  RADIOLOGY: No results found.  EKG:  Orders placed or performed during the hospital encounter of 12/09/15  . EKG 12-Lead  . EKG 12-Lead    ASSESSMENT AND PLAN:  Active Problems:   Acute blood loss anemia 1. Acute posthemorrhagic anemia, status post 2 units of packed red blood cell transfusion with improvement of hemoglobin, following hemoglobin level every 8 hours and transfuse as needed 2. Gastrointestinal bleed, likely due to ulcerative colitis exacerbation, patient is being continued on balsalazide, gastroenterology consultation is pending. Bleeding has subsided. Patient requests to be discharged home since he has an appointment with Dr. Bluford Kaufmannh in the next 2 days in his office 3. Hyponatremia, resolved with IV fluid administration 4. Leukopenia, resolved 5 . Hyperglycemia, resolved 6. Diarrhea, could be related to ulcerative colitis. However, cannot rule out infectious etiology as well, getting C. Difficile, initiate IV fluids  Management plans discussed with the patient, family and they are in agreement.   DRUG ALLERGIES:  Allergies  Allergen Reactions  . Penicillins Anaphylaxis    Has patient had a PCN reaction causing immediate rash, facial/tongue/throat swelling, SOB or lightheadedness with hypotension: Yes Has patient had a PCN reaction causing severe rash involving mucus membranes or skin necrosis: No Has patient had a PCN reaction that required hospitalization No Has patient had a PCN reaction occurring within  the last 10 years: No If all of the above answers are "NO", then may proceed with Cephalosporin use.  . Cephalosporins Other (See Comments)    Reaction:  Unknown     CODE STATUS:     Code Status Orders        Start     Ordered   12/09/15 1618  Full code   Continuous     12/09/15 1618      TOTAL TIME TAKING CARE OF THIS PATIENT: 40 minutes.    Katharina CaperVAICKUTE,Evanny Ellerbe M.D on 12/10/2015 at 1:32 PM  Between 7am to 6pm - Pager - [phone]  After 6pm go to [email or web address] - password [email or web address] [facility]  [facility]  Office  [phone]  CC: Primary care physician; Domenic SchwabLindley,Cheryl Paulette, FNP

## 2015-12-10 NOTE — Progress Notes (Signed)
Order for d/c.  AVS, f/u appt, Rx reviewed (discussed pred taper at [facility]).  All questions answered; pt verbalized understanding.  Waiting on pt's brother to arrive to drive him home.  Will escort out for d/c with nsg staff

## 2015-12-10 NOTE — Consult Note (Signed)
GI Inpatient Consult Note  Reason for Consult:  UC, rectal bleeding and diarrhea   Attending Requesting Consult: [place]  History of Present Illness: [name] is a 55 y.o. male with UC on asacol a/w diarrhea, rectal bleeding.  Reports 7- 8 episodes of diarrhea per day for past few days. Also assoc with blood in stool.  No abd pain, n/v, f/c.   Reports another flare in past few months, says took prednisoe taper for that and resolved.   Reports being diagnose with UC about 3 years ago at [facility] and being followed in Dr Vergia Alberts. However, per the notes appears last colonoscopy was in 2005 and limited follow up.   Here in hospital, he says bleeding has resolved and diarhea has nearly resolved. This is surprising because he was not started on prednisone.  Asking to go home.   He was in hospital 11/2015 for diarrhea, was starte on IV solumedrol and d/c on prednisone taper which he has recently finished.    Past Medical History:  Past Medical History  Diagnosis Date  . Stroke (HCC)   . Hypertension   . Hyperlipidemia   . Ulcerative colitis (HCC)     Problem List: Patient Active Problem List   Diagnosis Date Noted  . Gastrointestinal bleed 12/10/2015  . Hyponatremia 12/10/2015  . Leukopenia 12/10/2015  . Hyperglycemia 12/10/2015  . Diarrhea 12/10/2015  . Ulcerative colitis (HCC) 12/10/2015  . Acute blood loss anemia 12/09/2015  . Exacerbation of ulcerative colitis (HCC) 11/22/2015  . Ulcerative colitis, chronic (HCC) 11/22/2015    Past Surgical History: Past Surgical History  Procedure Laterality Date  . No past surgeries      Allergies: Allergies  Allergen Reactions  . Penicillins Anaphylaxis    Has patient had a PCN reaction causing immediate rash, facial/tongue/throat swelling, SOB or lightheadedness with hypotension: Yes Has patient had a PCN reaction causing severe rash involving mucus membranes or skin necrosis: No Has patient had a PCN reaction that required  hospitalization No Has patient had a PCN reaction occurring within the last 10 years: No If all of the above answers are "NO", then may proceed with Cephalosporin use.  . Cephalosporins Other (See Comments)    Reaction:  Unknown     Home Medications: No prescriptions prior to admission   Home medication reconciliation was completed with the patient.   Scheduled Inpatient Medications:   . atorvastatin  20 mg Oral QHS  . balsalazide  750 mg Oral TID  . cholecalciferol  5,000 Units Oral Daily  . sodium chloride  3 mL Intravenous Q12H    Continuous Inpatient Infusions:     PRN Inpatient Medications:  acetaminophen **OR** acetaminophen, traZODone  Family History: family history includes Breast cancer in his mother and sister; Diabetes in his father; Hypertension in his father.  The patient's family history is negative for inflammatory bowel disorders, GI malignancy, or solid organ transplantation.  Social History:   reports that he has never smoked. He has never used smokeless tobacco. He reports that he does not drink alcohol or use illicit drugs.   Review of Systems: Constitutional: Weight is stable.  Eyes: No changes in vision. ENT: No oral lesions, sore throat.  GI: see HPI.  Heme/Lymph: No easy bruising.  CV: No chest pain.  GU: No hematuria.  Integumentary: No rashes.  Neuro: No headaches.  Psych: No depression/anxiety.  Endocrine: No heat/cold intolerance.  Allergic/Immunologic: No urticaria.  Resp: No cough, SOB.  Musculoskeletal: No joint swelling.  Physical Examination: BP 118/70 mmHg  Pulse 70  Temp(Src) 98.2 F (36.8 C) (Oral)  Resp 18  Ht 5\' 10"  (1.778 m)  Wt 72.576 kg (160 lb)  BMI 22.96 kg/m2  SpO2 100% Gen: NAD, alert and oriented x 4 HEENT: PEERLA, EOMI, Neck: supple, no JVD or thyromegaly Chest: CTA bilaterally, no wheezes, crackles, or other adventitious sounds CV: RRR, no m/g/c/r Abd: soft, NT, ND, +BS in all four quadrants; no HSM,  guarding, ridigity, or rebound tenderness Ext: no edema, well perfused with 2+ pulses, Skin: no rash or lesions noted Lymph: no LAD  Data: Lab Results  Component Value Date   WBC 5.9 12/10/2015   HGB 11.2* 12/10/2015   HCT 35.5* 12/10/2015   MCV 67.7* 12/10/2015   PLT 223 12/10/2015    Recent Labs Lab 12/10/15 0057 12/10/15 0813 12/10/15 1630  HGB 10.3* 10.5* 11.2*   Lab Results  Component Value Date   NA 138 12/10/2015   K 4.3 12/10/2015   CL 107 12/10/2015   CO2 27 12/10/2015   BUN 6 12/10/2015   CREATININE 0.80 12/10/2015   Lab Results  Component Value Date   ALT 6* 12/09/2015   AST 11* 12/09/2015   ALKPHOS 45 12/09/2015   BILITOT 0.6 12/09/2015   No results for input(s): APTT, INR, PTT in the last 168 hours.   Assessment/Plan: [name] is a 55 y.o. male with UC a/w rectal bleeding adn diarrhea.  He does not seem to be best historian but is adamant that rectal bleeding has resolved and diarrhea has nearly resolved and he is requesting to go home.  He has appt with Dr Bluford Kaufmannh on 1/9.  If his hx is accurate, I am ok with d/c today on pred taper and then f/u in GI clinic in one week for likely med esclation to biologic or immunomodulator assuming he is indeed complaint with 5-ASA.   Recommendations: - pred 40 mg x 7 days, 30 mg x 7 days, 20 mg x 7 days, 10 mg x 7 days - GI clinic f/u on 12/18/15 - cont balsalazide for now. - possible therapy escalation if he is compliant on balsalazide.   Thank you for the consult. Please call with questions or concerns.  REIN, Addison NaegeliMATTHEW GORDON, MD

## 2015-12-10 NOTE — Progress Notes (Signed)
Order stat hgb per Dr. Winona LegatoVaickute.  To call MD w/ results

## 2015-12-10 NOTE — Discharge Summary (Signed)
[facility] [place] [facility]   PATIENT NAME: [name]    MR#:  [id number]  DATE OF BIRTH:  04/13/1961  DATE OF ADMISSION:  12/09/2015 ADMITTING PHYSICIAN: Alford Highlandichard Wieting, MD  DATE OF DISCHARGE: No discharge date for patient encounter.  PRIMARY CARE PHYSICIAN: Domenic SchwabLindley,Cheryl Paulette, FNP     ADMISSION DIAGNOSIS:  Lower GI bleed [K92.2]  DISCHARGE DIAGNOSIS:  Active Problems:   Acute blood loss anemia   Gastrointestinal bleed   Hyponatremia   Leukopenia   Diarrhea   Ulcerative colitis (HCC)   Hyperglycemia   SECONDARY DIAGNOSIS:   Past Medical History  Diagnosis Date  . Stroke (HCC)   . Hypertension   . Hyperlipidemia   . Ulcerative colitis (HCC)     .pro HOSPITAL COURSE:   The patient is a 55 year old Caucasian male with history of ulcerative colitis who presents to the hospital with complaints of numerous episodes of rectal bleed. On arrival to emergency room, patient was relatively hypotensive, his hemoglobin level with rehydration dropped down to 7.7 and he was transfused packed red blood cells. After transfusion, patient's hemoglobin level improved to above 10 and remained stable. Patient was seen by gastroenterologist and recommended to initiate prednisone taper . He was felt to be stable to be discharged home Discussion by problem 1. Acute posthemorrhagic anemia, status post 2 units of packed red blood cell transfusion with improvement of hemoglobin, hemoglobin level remained stable. It is recommended to follow hemoglobin level also outpatient and make decisions about addition iron supplements if needed versus transfusion 2. Gastrointestinal bleed, likely due to ulcerative colitis exacerbation, patient was recommended to be initiated on steroid taper by gastroenterologist, which will be started upon discharge. He is to continue balsalazide, gastroenterology consultation is appreciated. Bleeding has subsided. Patient is to  follow-up with Dr. Bluford Kaufmannh in the next 2 days in his office per prior engagement. 3. Hyponatremia, resolved with IV fluid administration 4. Leukopenia, resolved 5 . Hyperglycemia, resolved 6. Diarrhea, could be related to ulcerative colitis. However, cannot rule out infectious etiology as well, ordered C. Difficile PCR, but results were not received prior to discharge DISCHARGE CONDITIONS:   Stable  CONSULTS OBTAINED:  Treatment Team:  Alford Highlandichard Wieting, MD Elnita MaxwellMatthew Gordon Rein, MD  DRUG ALLERGIES:   Allergies  Allergen Reactions  . Penicillins Anaphylaxis    Has patient had a PCN reaction causing immediate rash, facial/tongue/throat swelling, SOB or lightheadedness with hypotension: Yes Has patient had a PCN reaction causing severe rash involving mucus membranes or skin necrosis: No Has patient had a PCN reaction that required hospitalization No Has patient had a PCN reaction occurring within the last 10 years: No If all of the above answers are "NO", then may proceed with Cephalosporin use.  . Cephalosporins Other (See Comments)    Reaction:  Unknown     DISCHARGE MEDICATIONS:   Current Discharge Medication List    START taking these medications   Details  predniSONE (DELTASONE) 10 MG tablet Take 1 tablet (10 mg total) by mouth daily with breakfast. Qty: 105 tablet, Refills: 0      CONTINUE these medications which have NOT CHANGED   Details  atorvastatin (LIPITOR) 20 MG tablet Take 20 mg by mouth at bedtime.     balsalazide (COLAZAL) 750 MG capsule Take 750 mg by mouth 3 (three) times daily.    Cholecalciferol (VITAMIN D3) 5000 UNITS TABS Take 5,000 Units by mouth daily.    metoprolol succinate (TOPROL-XL) 25 MG 24 hr tablet  Take 12.5 mg by mouth every morning.     traZODone (DESYREL) 50 MG tablet Take 50 mg by mouth at bedtime as needed for sleep.      STOP taking these medications     clopidogrel (PLAVIX) 75 MG tablet          DISCHARGE INSTRUCTIONS:     Patient is to follow-up with his primary care physician and gastroenterologist as outpatient. Follow hemoglobin level as outpatient  If you experience worsening of your admission symptoms, develop shortness of breath, life threatening emergency, suicidal or homicidal thoughts you must seek medical attention immediately by calling 911 or calling your MD immediately  if symptoms less severe.  You Must read complete instructions/literature along with all the possible adverse reactions/side effects for all the Medicines you take and that have been prescribed to you. Take any new Medicines after you have completely understood and accept all the possible adverse reactions/side effects.   Please note  You were cared for by a hospitalist during your hospital stay. If you have any questions about your discharge medications or the care you received while you were in the hospital after you are discharged, you can call the unit and asked to speak with the hospitalist on call if the hospitalist that took care of you is not available. Once you are discharged, your primary care physician will handle any further medical issues. Please note that NO REFILLS for any discharge medications will be authorized once you are discharged, as it is imperative that you return to your primary care physician (or establish a relationship with a primary care physician if you do not have one) for your aftercare needs so that they can reassess your need for medications and monitor your lab values.    Today   CHIEF COMPLAINT:   Chief Complaint  Patient presents with  . Loss of Consciousness  . Rectal Bleeding    HISTORY OF PRESENT ILLNESS:  [name]  is a 55 y.o. male with a known history of ulcerative colitis who presents to the hospital with complaints of numerous episodes of rectal bleed. On arrival to emergency room, patient was relatively hypotensive, his hemoglobin level with rehydration dropped down to 7.7 and  he was transfused packed red blood cells. After transfusion, patient's hemoglobin level improved to above 10 and remained stable. Patient was seen by gastroenterologist and recommended to initiate prednisone taper . He was felt to be stable to be discharged home Discussion by problem 1. Acute posthemorrhagic anemia, status post 2 units of packed red blood cell transfusion with improvement of hemoglobin, hemoglobin level remained stable. It is recommended to follow hemoglobin level also outpatient and make decisions about addition iron supplements if needed versus transfusion 2. Gastrointestinal bleed, likely due to ulcerative colitis exacerbation, patient was recommended to be initiated on steroid taper by gastroenterologist, which will be started upon discharge. He is to continue balsalazide, gastroenterology consultation is appreciated. Bleeding has subsided. Patient is to follow-up with Dr. Bluford Kaufmann in the next 2 days in his office per prior engagement. 3. Hyponatremia, resolved with IV fluid administration 4. Leukopenia, resolved 5 . Hyperglycemia, resolved 6. Diarrhea, could be related to ulcerative colitis. However, cannot rule out infectious etiology as well, ordered C. Difficile PCR, but results were not received prior to discharge    VITAL SIGNS:  Blood pressure 118/70, pulse 70, temperature 98.2 F (36.8 C), temperature source Oral, resp. rate 18, height 5\' 10"  (1.778 m), weight 72.576 kg (160 lb), SpO2 100 %.  I/O:   Intake/Output Summary (Last 24 hours) at 12/10/15 1712 Last data filed at 12/10/15 0940  Gross per 24 hour  Intake   1235 ml  Output   1600 ml  Net   -365 ml    PHYSICAL EXAMINATION:  GENERAL:  55 y.o.-year-old patient lying in the bed with no acute distress.  EYES: Pupils equal, round, reactive to light and accommodation. No scleral icterus. Extraocular muscles intact.  HEENT: Head atraumatic, normocephalic. Oropharynx and nasopharynx clear.  NECK:  Supple, no jugular  venous distention. No thyroid enlargement, no tenderness.  LUNGS: Normal breath sounds bilaterally, no wheezing, rales,rhonchi or crepitation. No use of accessory muscles of respiration.  CARDIOVASCULAR: S1, S2 normal. No murmurs, rubs, or gallops.  ABDOMEN: Soft, mildly tender in right side of abdomen but no rebound or guarding, non-distended. Bowel sounds present. No organomegaly or mass.  EXTREMITIES: No pedal edema, cyanosis, or clubbing.  NEUROLOGIC: Cranial nerves II through XII are intact. Muscle strength 5/5 in all extremities. Sensation intact. Gait not checked.  PSYCHIATRIC: The patient is alert and oriented x 3.  SKIN: No obvious rash, lesion, or ulcer.   DATA REVIEW:   CBC  Recent Labs Lab 12/10/15 0813 12/10/15 1630  WBC 5.9  --   HGB 10.5* 11.2*  HCT 33.0* 35.5*  PLT 223  --     Chemistries   Recent Labs Lab 12/09/15 1417 12/10/15 0813  NA 137 138  K 3.7 4.3  CL 110 107  CO2 23 27  GLUCOSE 122* 91  BUN 9 6  CREATININE 0.81 0.80  CALCIUM 8.0* 8.4*  AST 11*  --   ALT 6*  --   ALKPHOS 45  --   BILITOT 0.6  --     Cardiac Enzymes No results for input(s): TROPONINI in the last 168 hours.  Microbiology Results  Results for orders placed or performed during the hospital encounter of 11/22/15  C difficile quick scan w PCR reflex     Status: None   Collection Time: 11/22/15  3:35 PM  Result Value Ref Range Status   C Diff antigen NEGATIVE NEGATIVE Final   C Diff toxin NEGATIVE NEGATIVE Final   C Diff interpretation Negative for C. difficile  Final    RADIOLOGY:  No results found.  EKG:   Orders placed or performed during the hospital encounter of 12/09/15  . EKG 12-Lead  . EKG 12-Lead      Management plans discussed with the patient, family and they are in agreement.  CODE STATUS:     Code Status Orders        Start     Ordered   12/09/15 1618  Full code   Continuous     12/09/15 1618      TOTAL TIME TAKING CARE OF THIS PATIENT:  40 minutes.  Discussed with Dr. Ascencion Dike M.D on 12/10/2015 at 5:12 PM  Between 7am to 6pm - Pager - [phone]  After 6pm go to [email or web address] - password [email or web address] [facility]  [facility]  Office  [phone]  CC: Primary care physician; Domenic Schwab, FNP

## 2015-12-10 NOTE — Progress Notes (Signed)
Paged MD re: med reconciliation.  Per Dr. Hilton SinclairWeiting, d/c meds

## 2015-12-12 ENCOUNTER — Ambulatory Visit: Payer: Medicaid Other | Attending: Neurology | Admitting: Occupational Therapy

## 2015-12-12 ENCOUNTER — Encounter: Payer: Self-pay | Admitting: Occupational Therapy

## 2015-12-12 DIAGNOSIS — R278 Other lack of coordination: Secondary | ICD-10-CM | POA: Insufficient documentation

## 2015-12-12 NOTE — Patient Instructions (Signed)
Instructed in alternate ways of donning a jacket.

## 2015-12-12 NOTE — Therapy (Signed)
[name] [facility] MAIN [facility] SERVICES [address] [place], [place], [address] Phone: [phone]   Fax:  [phone]  Occupational Therapy Evaluation  Patient Details  Name: [name] MRN: [id number] Date of Birth: January 24, 1961 Referring Provider: Theora Master  Encounter Date: 12/12/2015      OT End of Session - 12/12/15 1652    Date for OT Re-Evaluation 12/12/15   Authorization Type medicaid   Authorization Time Period 12/12/2015-12/12/2015   Authorization - Visit Number 1   Authorization - Number of Visits 1   OT Start Time 1300   OT Stop Time 1355   OT Time Calculation (min) 55 min      Past Medical History  Diagnosis Date  . Stroke (HCC)   . Hypertension   . Hyperlipidemia   . Ulcerative colitis [facility])     Past Surgical History  Procedure Laterality Date  . No past surgeries      There were no vitals filed for this visit.  Visit Diagnosis:  Muscular incoordination      Subjective Assessment - 12/12/15 1325    Pain Score 0-No pain            [facility] OT Assessment - 12/12/15 0001    Assessment   Diagnosis weakness   Referring Provider Theora Master   Onset Date 12/11/13   Assessment Patient has deficits in ADL B UE function,    Prior Therapy --  two years ago   Precautions   Precautions None   Restrictions   Weight Bearing Restrictions No   Home  Environment   Family/patient expects to be discharged to: Private residence   Home Access Stairs   Home Layout One level   Bathroom Shower/Tub Tub/Shower unit   Prior Function   Level of Independence Needs assistance with ADLs    This patient is a 55 year old male who came to [facility] after a distant CVA and recent hospitalization for colitis and needed 2 pints of blood.   He lives in a one story home with 2 steps to enter. He had been getting some assist with ADL from his father. UE range of motion- Bilateral shoulder flexion to  90o - elbow motions WNL, R forearm supination 55o, wrist extension 48o, flexion 55o, full hand motion - L UE supination 58o, wrist motions are WNL, finger motions are limited by 10%, very limited thumb movement. R UE strength shoulder 4+/5, distal motions are 5/5, grip 80 lbs. L UE shoulder flexion 3+/5 distal motions are 5/5/ grip 40 lbs.   Sensation for temp, sharp, and light touch are intact.  9 hole peg test Left unable R 31 seconds. Issued a built up handle for a pen and patient able to write legibly.                         OT Education - 12/12/15 1328    Education provided Yes   Education Details Educated on stroke and why problems with arms   Person(s) Educated Patient   Methods Explanation   Comprehension Verbalized understanding             OT Long Term Goals - 12/12/15 1655    OT LONG TERM GOAL #1   Title Patient will continue with program using built up handle on pen and spoon.   Baseline Had no adapted equipment   Time 1   Period Days   Status New  Plan - 12/12/15 1715    Clinical Impression Statement This patient is a 55 year old male who [facility] cva. He recently was hospitalized with colitis. He has some deficites with ADL including writing , putting on coat, tying shoes, fastening fasteners. Patient given some suggestions and issued a built up handle for a pen and spoon. Would recoment OT 2 x per week, however, patient isurance only will cover evaluation.   Pt will benefit from skilled therapeutic intervention in order to improve on the following deficits (Retired) Decreased coordination;Decreased range of motion;Decreased knowledge of use of DME   Rehab Potential Good   OT Frequency 1x / week   OT Duration Other (comment)  1 week   OT Treatment/Interventions Self-care/ADL training   Consulted and Agree with Plan of Care Patient;Family member/caregiver   Family Member Consulted father        Problem  List Patient Active Problem List   Diagnosis Date Noted  . Gastrointestinal bleed 12/10/2015  . Hyponatremia 12/10/2015  . Leukopenia 12/10/2015  . Hyperglycemia 12/10/2015  . Diarrhea 12/10/2015  . Ulcerative colitis (HCC) 12/10/2015  . Acute blood loss anemia 12/09/2015  . Exacerbation of ulcerative colitis (HCC) 11/22/2015  . Ulcerative colitis, chronic (HCC) 11/22/2015   Ocie CornfieldJohn M Shylee Durrett, MS/OTR/L  Ocie CornfieldHuff, Elmor Kost M 12/12/2015, 5:16 PM  [facility] [address] [place], [place], [address] Phone: [phone]   Fax:  [phone]  Name: [name] MRN: [id number] Date of Birth: 12/16/1960

## 2016-02-08 ENCOUNTER — Ambulatory Visit: Payer: Medicaid Other | Admitting: Anesthesiology

## 2016-02-08 ENCOUNTER — Encounter: Payer: Self-pay | Admitting: *Deleted

## 2016-02-08 ENCOUNTER — Emergency Department
Admission: EM | Admit: 2016-02-08 | Discharge: 2016-02-08 | Disposition: A | Discharge status: Home / Self Care | Payer: Medicaid Other

## 2016-02-08 ENCOUNTER — Ambulatory Visit
Admission: RE | Admit: 2016-02-08 | Discharge: 2016-02-08 | Disposition: A | Discharge status: Home / Self Care | Payer: Medicaid Other | Source: Ambulatory Visit | Attending: Gastroenterology | Admitting: Gastroenterology

## 2016-02-08 ENCOUNTER — Encounter
Admission: RE | Disposition: A | Discharge status: Home / Self Care | Payer: Self-pay | Source: Ambulatory Visit | Attending: Gastroenterology

## 2016-02-08 DIAGNOSIS — D62 Acute posthemorrhagic anemia: Secondary | ICD-10-CM | POA: Diagnosis not present

## 2016-02-08 DIAGNOSIS — I10 Essential (primary) hypertension: Secondary | ICD-10-CM | POA: Diagnosis not present

## 2016-02-08 DIAGNOSIS — I69354 Hemiplegia and hemiparesis following cerebral infarction affecting left non-dominant side: Secondary | ICD-10-CM | POA: Insufficient documentation

## 2016-02-08 DIAGNOSIS — R197 Diarrhea, unspecified: Secondary | ICD-10-CM | POA: Insufficient documentation

## 2016-02-08 DIAGNOSIS — R739 Hyperglycemia, unspecified: Secondary | ICD-10-CM | POA: Diagnosis not present

## 2016-02-08 DIAGNOSIS — E871 Hypo-osmolality and hyponatremia: Secondary | ICD-10-CM | POA: Insufficient documentation

## 2016-02-08 DIAGNOSIS — K5190 Ulcerative colitis, unspecified, without complications: Secondary | ICD-10-CM | POA: Diagnosis not present

## 2016-02-08 DIAGNOSIS — I69351 Hemiplegia and hemiparesis following cerebral infarction affecting right dominant side: Secondary | ICD-10-CM | POA: Insufficient documentation

## 2016-02-08 DIAGNOSIS — M542 Cervicalgia: Secondary | ICD-10-CM | POA: Insufficient documentation

## 2016-02-08 DIAGNOSIS — Z79899 Other long term (current) drug therapy: Secondary | ICD-10-CM | POA: Insufficient documentation

## 2016-02-08 DIAGNOSIS — K922 Gastrointestinal hemorrhage, unspecified: Secondary | ICD-10-CM | POA: Insufficient documentation

## 2016-02-08 DIAGNOSIS — R1310 Dysphagia, unspecified: Secondary | ICD-10-CM | POA: Insufficient documentation

## 2016-02-08 HISTORY — DX: Dysphagia, unspecified: R13.10

## 2016-02-08 HISTORY — PX: ESOPHAGOGASTRODUODENOSCOPY (EGD) WITH PROPOFOL: SHX5813

## 2016-02-08 HISTORY — PX: COLONOSCOPY WITH PROPOFOL: SHX5780

## 2016-02-08 SURGERY — COLONOSCOPY WITH PROPOFOL
Anesthesia: General

## 2016-02-08 MED ORDER — PROPOFOL 500 MG/50ML IV EMUL
INTRAVENOUS | Status: DC | PRN
  Administered 2016-02-08: 120 ug/kg/min via INTRAVENOUS

## 2016-02-08 MED ORDER — MIDAZOLAM HCL 2 MG/2ML IJ SOLN
INTRAMUSCULAR | Status: DC | PRN
  Administered 2016-02-08: 1 mg via INTRAVENOUS

## 2016-02-08 MED ORDER — FENTANYL CITRATE (PF) 100 MCG/2ML IJ SOLN
INTRAMUSCULAR | Status: DC | PRN
  Administered 2016-02-08: 50 ug via INTRAVENOUS

## 2016-02-08 MED ORDER — SODIUM CHLORIDE 0.9 % IV SOLN: INTRAVENOUS | Status: DC

## 2016-02-08 MED ORDER — SODIUM CHLORIDE 0.9 % IV SOLN
INTRAVENOUS | Status: DC
  Administered 2016-02-08: 1000 mL via INTRAVENOUS

## 2016-02-08 MED ORDER — LIDOCAINE HCL (CARDIAC) 20 MG/ML IV SOLN
INTRAVENOUS | Status: DC | PRN
  Administered 2016-02-08: 30 mg via INTRAVENOUS

## 2016-02-08 MED ORDER — GLYCOPYRROLATE 0.2 MG/ML IJ SOLN
INTRAMUSCULAR | Status: DC | PRN
  Administered 2016-02-08: 0.1 mg via INTRAVENOUS

## 2016-02-08 NOTE — H&P (Signed)
  Date of Initial H&P: 01/22/2016  History reviewed, patient examined, no change in status, stable for surgery.

## 2016-02-08 NOTE — Op Note (Signed)
[name] Patient Name: [name] Procedure Date: 02/08/2016 12:21 PM MRN: [id number] Account #: [id number] Date of Birth: 17-Apr-1961 Admit Type: Outpatient Age: 55 Room: [facility] ENDO ROOM 4 Gender: Male Note Status: Finalized Procedure:            Colonoscopy Indications:          Ulcerative colitis Providers:            [name]. Mathew Kaufmann, MD Referring MD:         [name]. [name] (Referring MD) Medicines:            Monitored Anesthesia Care Complications:        No immediate complications. Procedure:            Pre-Anesthesia Assessment:                       - Prior to the procedure, a History and Physical was                        performed, and patient medications, allergies and                        sensitivities were reviewed. The patient's tolerance of                        previous anesthesia was reviewed.                       - The risks and benefits of the procedure and the                        sedation options and risks were discussed with the                        patient. All questions were answered and informed                        consent was obtained.                       - After reviewing the risks and benefits, the patient                        was deemed in satisfactory condition to undergo the                        procedure.                       After obtaining informed consent, the colonoscope was                        passed under direct vision. Throughout the procedure,                        the patient's blood pressure, pulse, and oxygen                        saturations were monitored continuously. The                        Colonoscope was introduced  through the anus with the                        intention of advancing to the cecum. The scope was                        advanced to the sigmoid colon before the procedure was                        aborted. Medications were given. The colonoscopy was                    performed with difficulty due to restricted mobility of                        the colon. The patient tolerated the procedure well.                        The quality of the bowel preparation was fair. Findings:      A diffuse area of severely erythematous, hemorrhagic and inflamed mucosa       was found in the rectum. Biopsies were taken with a cold forceps for       histology.      A diffuse area of severely edematous mucosa was found in the sigmoid       colon. Biopsies were taken with a cold forceps for histology.      Colonic mucosa was so edematous, it was hard to even push the scope.       Elected to stop. Impression:           - Preparation of the colon was fair.                       - Erythematous, hemorrhagic and inflamed mucosa in the                        rectum. Biopsied.                       - Edematous mucosa in the sigmoid colon. Biopsied. Recommendation:       - The findings and recommendations were discussed with                        the patient.                       - Start high dose prednisone. Will need to repeat                        colonocopy in a few months once edema and inflammation                        improves. Mathew Cullens, MD 02/08/2016 12:50:10 PM This report has been signed electronically. Number of Addenda: 0 Note Initiated On: 02/08/2016 12:21 PM Total Procedure Duration: 0 hours 4 minutes 24 seconds       [facility]

## 2016-02-08 NOTE — Anesthesia Preprocedure Evaluation (Signed)
Anesthesia Evaluation  Patient identified by MRN, date of birth, ID band Patient awake    Reviewed: Allergy & Precautions, NPO status , Patient's Chart, lab work & pertinent test results, reviewed documented beta blocker date and time   Airway Mallampati: II       Dental  (+) Poor Dentition, Chipped   Pulmonary neg pulmonary ROS,    Pulmonary exam normal breath sounds clear to auscultation       Cardiovascular hypertension, Pt. on medications and Pt. on home beta blockers Normal cardiovascular exam     Neuro/Psych CVA negative psych ROS   GI/Hepatic Neg liver ROS, PUD,   Endo/Other  negative endocrine ROS  Renal/GU negative Renal ROS  negative genitourinary   Musculoskeletal negative musculoskeletal ROS (+)   Abdominal Normal abdominal exam  (+)   Peds negative pediatric ROS (+)  Hematology  (+) anemia ,   Anesthesia Other Findings   Reproductive/Obstetrics                             Anesthesia Physical Anesthesia Plan  ASA: III  Anesthesia Plan: General   Post-op Pain Management:    Induction: Intravenous  Airway Management Planned: Nasal Cannula  Additional Equipment:   Intra-op Plan:   Post-operative Plan:   Informed Consent: I have reviewed the patients History and Physical, chart, labs and discussed the procedure including the risks, benefits and alternatives for the proposed anesthesia with the patient or authorized representative who has indicated his/her understanding and acceptance.   Dental advisory given  Plan Discussed with: CRNA and Surgeon  Anesthesia Plan Comments:         Anesthesia Quick Evaluation

## 2016-02-08 NOTE — Anesthesia Procedure Notes (Signed)
Performed by: COOK-MARTIN, Miner Koral Pre-anesthesia Checklist: Patient identified, Emergency Drugs available, Suction available, Patient being monitored and Timeout performed Patient Re-evaluated:Patient Re-evaluated prior to inductionOxygen Delivery Method: Nasal cannula Preoxygenation: Pre-oxygenation with 100% oxygen Intubation Type: IV induction Airway Equipment and Method: Bite block Placement Confirmation: positive ETCO2 and CO2 detector     

## 2016-02-08 NOTE — Anesthesia Postprocedure Evaluation (Signed)
Anesthesia Post Note  Patient: [name]  Procedure(s) Performed: Procedure(s) (LRB): COLONOSCOPY WITH PROPOFOL (N/A) ESOPHAGOGASTRODUODENOSCOPY (EGD) WITH PROPOFOL (N/A)  Patient location during evaluation: PACU Anesthesia Type: General Level of consciousness: awake and alert and oriented Pain management: pain level controlled Vital Signs Assessment: post-procedure vital signs reviewed and stable Respiratory status: spontaneous breathing Cardiovascular status: blood pressure returned to baseline Anesthetic complications: no    Last Vitals:  Filed Vitals:   02/08/16 1310 02/08/16 1320  BP: 104/82 114/80  Pulse: 60 69  Temp:    Resp: 14 17    Last Pain: There were no vitals filed for this visit.               Shayana Hornstein

## 2016-02-08 NOTE — Op Note (Signed)
[facility] Gastroenterology Patient Name: [name] Procedure Date: 02/08/2016 12:22 PM MRN: [id number] Account #: [id number] Date of Birth: 03-18-1961 Admit Type: Outpatient Age: 55 Room: [facility] ENDO ROOM 4 Gender: Male Note Status: Finalized Procedure:            Upper GI endoscopy Indications:          Dysphagia Providers:            Ezzard Standing. Bluford Kaufmann, MD Referring MD:         [name] (Referring MD) Medicines:            Monitored Anesthesia Care Complications:        No immediate complications. Procedure:            Pre-Anesthesia Assessment:                       - Prior to the procedure, a History and Physical was                        performed, and patient medications, allergies and                        sensitivities were reviewed. The patient's tolerance of                        previous anesthesia was reviewed.                       - The risks and benefits of the procedure and the                        sedation options and risks were discussed with the                        patient. All questions were answered and informed                        consent was obtained.                       - After reviewing the risks and benefits, the patient                        was deemed in satisfactory condition to undergo the                        procedure.                       After obtaining informed consent, the endoscope was                        passed under direct vision. Throughout the procedure,                        the patient's blood pressure, pulse, and oxygen                        saturations were monitored continuously. The Endoscope                        was  introduced through the mouth, and advanced to the                        second part of duodenum. The upper GI endoscopy was                        accomplished without difficulty. The patient tolerated                        the procedure well. Findings:      No endoscopic  abnormality was evident in the esophagus to explain the       patient's complaint of dysphagia. It was decided, however, to proceed       with dilation of the entire esophagus.      The entire examined stomach was normal.      The examined duodenum was normal. Impression:           - No endoscopic esophageal abnormality to explain                        patient's dysphagia. Esophagus dilated.                       - Normal stomach.                       - Normal examined duodenum.                       - No specimens collected. Recommendation:       - Discharge patient to home.                       - Observe patient's clinical course.                       - The findings and recommendations were discussed with                        the patient. Procedure Code(s):    --- Professional ---                       [phone], Esophagogastroduodenoscopy, flexible, transoral;                        diagnostic, including collection of specimen(s) by                        brushing or washing, when performed (separate procedure) Diagnosis Code(s):    --- Professional ---                       R13.10, Dysphagia, unspecified CPT copyright 2016 [employer] Association. All rights reserved. The codes documented in this report are preliminary and upon coder review may  be revised to meet current compliance requirements. Wallace Cullens, MD 02/08/2016 12:38:22 PM This report has been signed electronically. Number of Addenda: 0 Note Initiated On: 02/08/2016 12:22 PM      [facility]

## 2016-02-08 NOTE — OR Nursing (Signed)
Patient is post upper endoscopy and colonoscopy, he had 45 minutes in recovery then he was assisted to the bathroom with a nurse.  Instead of pulling the string for help, patient hit the floor, nurse standing outside the room heard and thump on the floor. When the nurse opened the door she found patient sitting on the floor with pants around his ankles.  Patient states that he hit his head on the floor, MD said take patient to the ER for follow up.  Post fall vitals are BP 128/66, P 70, Resp 17, temp 97.4 tympanic, O2 sats 98%.

## 2016-02-08 NOTE — Transfer of Care (Signed)
Immediate Anesthesia Transfer of Care Note  Patient: [name]  Procedure(s) Performed: Procedure(s): COLONOSCOPY WITH PROPOFOL (N/A) ESOPHAGOGASTRODUODENOSCOPY (EGD) WITH PROPOFOL (N/A)  Patient Location: PACU  Anesthesia Type:General  Level of Consciousness: awake, alert  and sedated  Airway & Oxygen Therapy: Patient Spontanous Breathing and Patient connected to nasal cannula oxygen  Post-op Assessment: Report given to RN and Post -op Vital signs reviewed and stable  Post vital signs: Reviewed  Last Vitals:  Filed Vitals:   02/08/16 1136  BP: 111/73  Pulse: 76  Temp: 36.2 C  Resp: 18    Complications: No apparent anesthesia complications

## 2016-02-09 LAB — SURGICAL PATHOLOGY

## 2016-07-06 IMAGING — CT CT ABD-PELV W/ CM
1 of 3 series · 14 of 32 positions shown, 19 images · IV contrast (omnipaque)
Comparison: None.

CLINICAL DATA: Rectal bleeding for 3 weeks.

EXAM:
CT ABDOMEN AND PELVIS WITH CONTRAST
TECHNIQUE: Multidetector CT imaging of the abdomen and pelvis was performed
using the standard protocol following bolus administration of
intravenous contrast.
CONTRAST:  100mL OMNIPAQUE IOHEXOL 300 MG/ML  SOLN

[Series 2: routine abd pel with · axial · 0.68mm/px · z∈[-910,-515]mm · 14 of 89 slices shown, 19 images]
[im 5/89  soft-tissue]
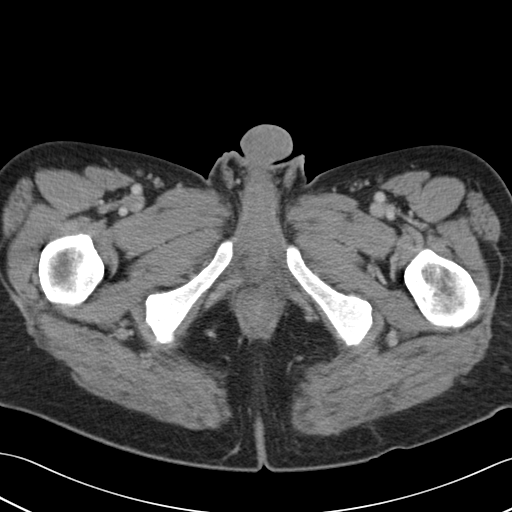
[im 5/89  bone]
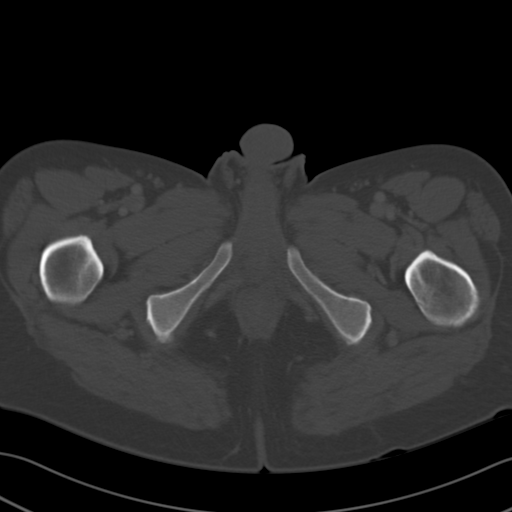
[im 14/89  soft-tissue]
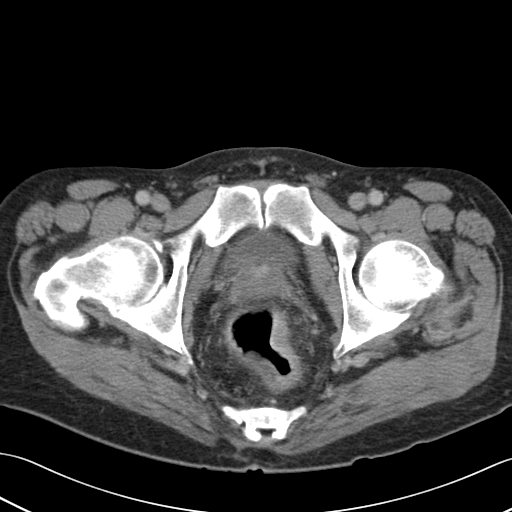
[im 19/89  soft-tissue]
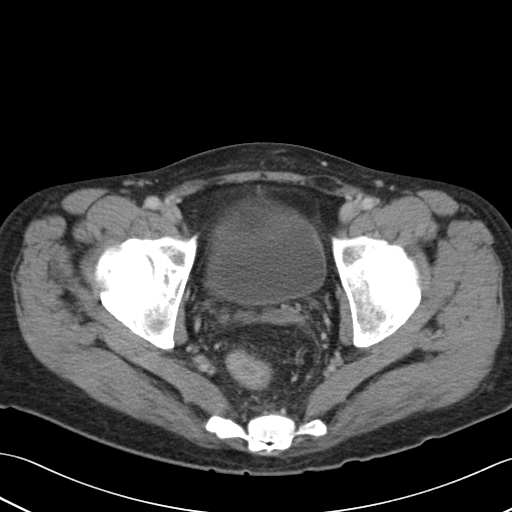
[im 24/89  soft-tissue]
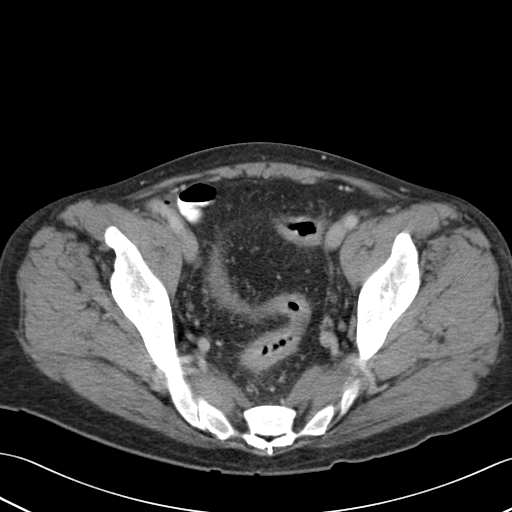
[im 33/89  soft-tissue]
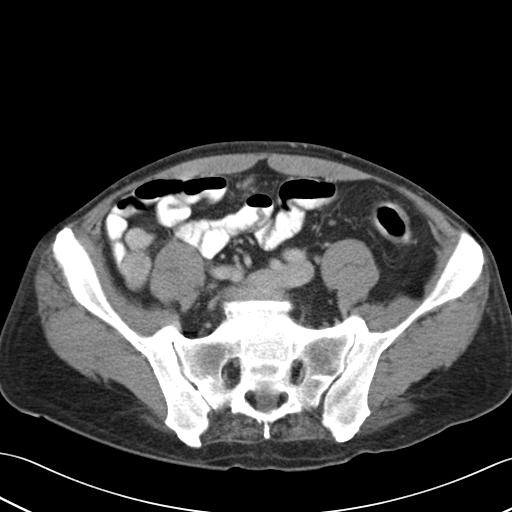
[im 38/89  soft-tissue]
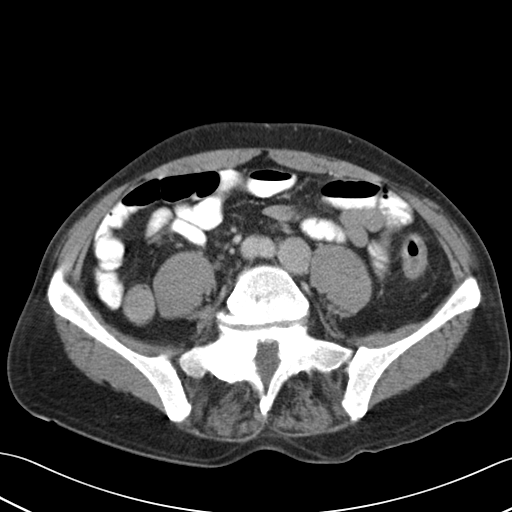
[im 47/89  soft-tissue]
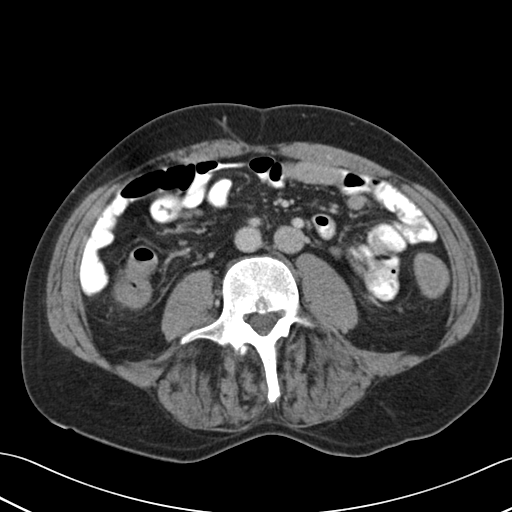
[im 51/89  soft-tissue]
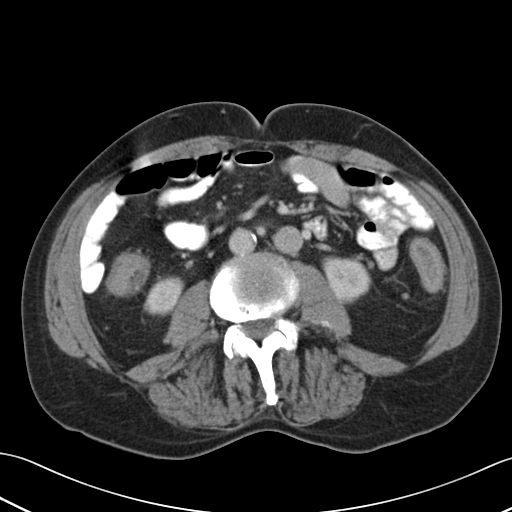
[im 56/89  soft-tissue]
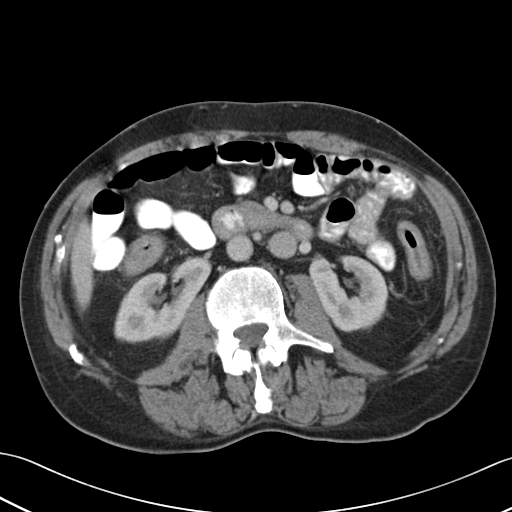
[im 56/89  bone]
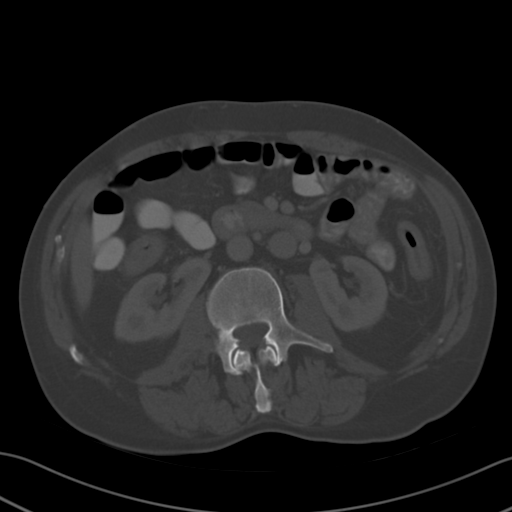
[im 65/89  soft-tissue]
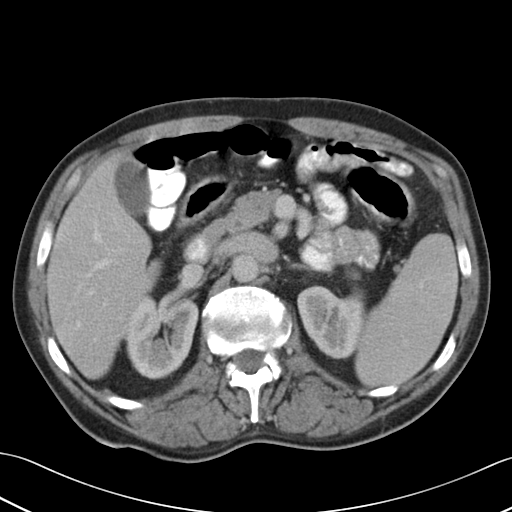
[im 70/89  soft-tissue]
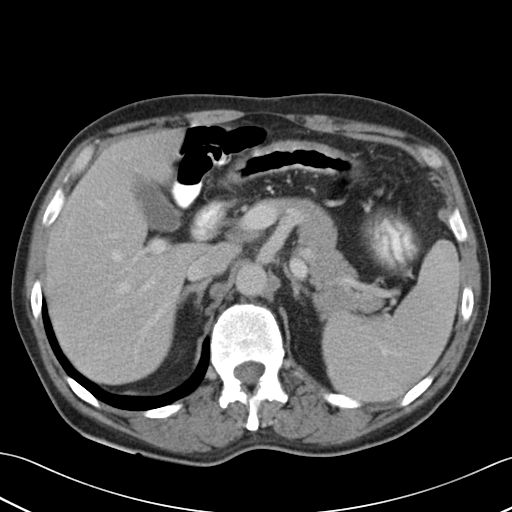
[im 70/89  lung]
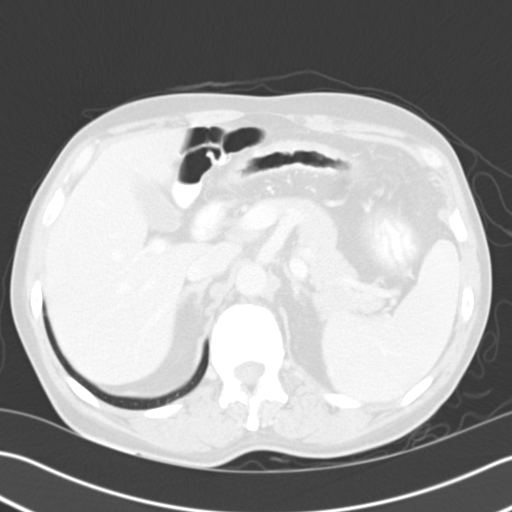
[im 75/89  soft-tissue]
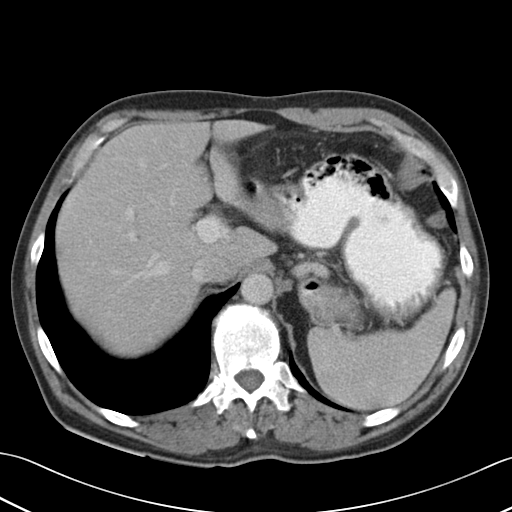
[im 75/89  lung]
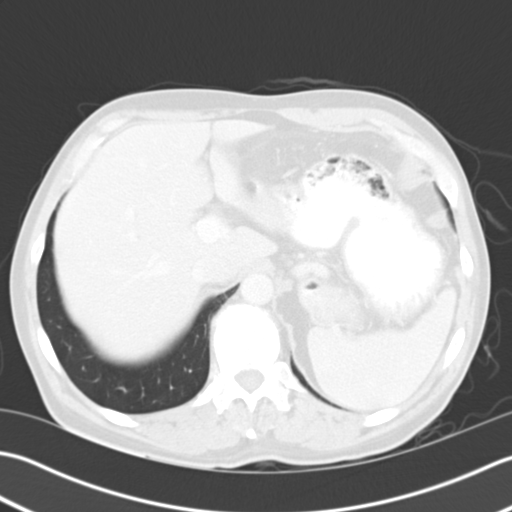
[im 79/89  lung]
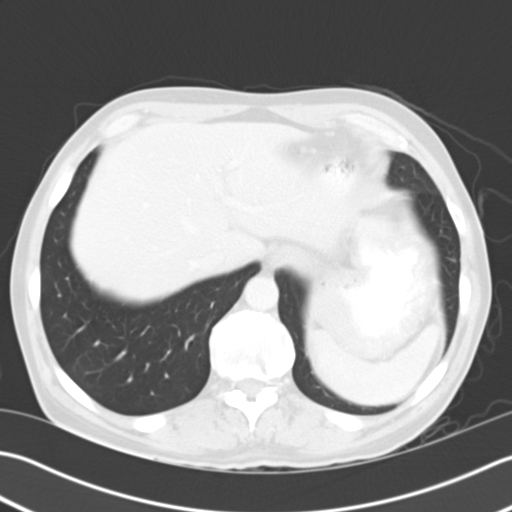
[im 84/89  soft-tissue]
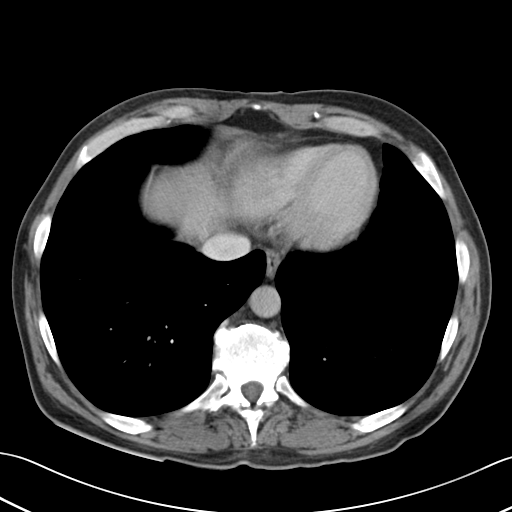
[im 84/89  lung]
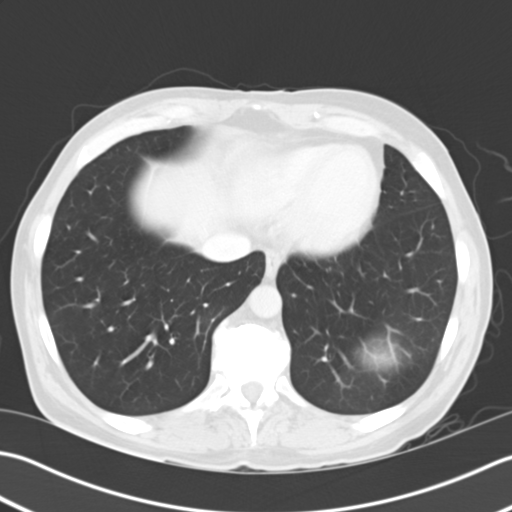

[14 of 32 positions shown; findings below may reference images not displayed]

FINDINGS: Mild multilevel degenerative disc disease is noted in the lumbar
spine. Visualized lung bases are unremarkable.

No gallstones are noted. The liver, spleen and pancreas appear
normal. Adrenal glands appear normal. No hydronephrosis or renal
obstruction is noted. No renal or ureteral calculi are noted.
Atherosclerosis of abdominal aorta is noted without aneurysm
formation. There is no evidence of bowel obstruction. There is noted
diffuse wall thickening of the colon, particularly in the sigmoid
region, consistent with infectious or inflammatory colitis. No
abnormal fluid collection is noted. Urinary bladder appears normal.
No significant adenopathy is noted.
IMPRESSION: Atherosclerosis of abdominal aorta without aneurysm formation.

Diffuse wall thickening with mild surrounding inflammation is seen
in the colon, particularly in the sigmoid region, consistent with
inflammatory or infectious colitis.

## 2016-07-24 ENCOUNTER — Ambulatory Visit: Payer: Medicaid Other | Admitting: Cardiology

## 2016-08-06 NOTE — Progress Notes (Deleted)
Cardiology Office Note   Date:  08/06/2016   ID:  Mathew Osgoodhomas D Croslin, DOB 05/08/1961, MRN [id number]  Referring Doctor:  Domenic SchwabLindley,Cheryl Paulette, FNP   Cardiologist:   Almond LintAileen Kaysin Brock, MD   Reason for consultation:  No chief complaint on file.     History of Present Illness: [name] is a 55 y.o. male who presents for *** [place]  ROS:  Please see the history of present illness. Aside from mentioned under HPI, all other systems are reviewed and negative.     Past Medical History:  Diagnosis Date  . Dysphagia   . Hyperlipidemia   . Hypertension   . Stroke (HCC)   . Ulcerative colitis [facility](HCC)     Past Surgical History:  Procedure Laterality Date  . COLONOSCOPY WITH PROPOFOL N/A 02/08/2016   Procedure: COLONOSCOPY WITH PROPOFOL;  Surgeon: Wallace CullensPaul Y Oh, MD;  [facility] ENDOSCOPY;  Service: Gastroenterology;  Laterality: N/A;  . ESOPHAGOGASTRODUODENOSCOPY (EGD) WITH PROPOFOL N/A 02/08/2016   Procedure: ESOPHAGOGASTRODUODENOSCOPY (EGD) WITH PROPOFOL;  Surgeon: Wallace CullensPaul Y Oh, MD;  [facility];  Service: Gastroenterology;  Laterality: N/A;  . NO PAST SURGERIES       reports that he has never smoked. He has never used smokeless tobacco. He reports that he does not drink alcohol or use drugs.   family history includes Breast cancer in his mother and sister; Diabetes in his father; Hypertension in his father.   Outpatient Medications Prior to Visit  Medication Sig Dispense Refill  . atorvastatin (LIPITOR) 20 MG tablet Take 20 mg by mouth at bedtime.     . balsalazide (COLAZAL) 750 MG capsule Take 750 mg by mouth 3 (three) times daily.    . Cholecalciferol (VITAMIN D3) 5000 UNITS TABS Take 5,000 Units by mouth daily.    . clopidogrel (PLAVIX) 75 MG tablet Take 75 mg by mouth daily.    . metoprolol succinate (TOPROL-XL) 25 MG 24 hr tablet Take 12.5 mg by mouth every morning.     . predniSONE (DELTASONE) 10 MG tablet Take 1 tablet (10 mg total) by mouth daily with  breakfast. 105 tablet 0  . traZODone (DESYREL) 50 MG tablet Take 50 mg by mouth at bedtime as needed for sleep.     No facility-administered medications prior to visit.      Allergies: Penicillins and Cephalosporins    PHYSICAL EXAM: VS:  There were no vitals taken for this visit. , There is no height or weight on file to calculate BMI. Wt Readings from Last 3 Encounters:  02/08/16 160 lb (72.6 kg)  12/09/15 160 lb (72.6 kg)  11/22/15 170 lb (77.1 kg)    GENERAL:  well developed, well nourished, *** obese, not in acute distress HEENT: normocephalic, pink conjunctivae, anicteric sclerae, no xanthelasma, normal dentition, oropharynx clear NECK:  no neck vein engorgement, JVP normal, no hepatojugular reflux, carotid upstroke brisk and symmetric, no bruit, no thyromegaly, no lymphadenopathy LUNGS:  good respiratory effort, clear to auscultation bilaterally CV:  PMI not displaced, no thrills, no lifts, S1 and S2 within normal limits, no palpable S3 or S4, no murmurs, no rubs, no gallops ABD:  Soft, nontender, nondistended, normoactive bowel sounds, no abdominal aortic bruit, no hepatomegaly, no splenomegaly MS: nontender back, no kyphosis, no scoliosis, no joint deformities EXT:  2+ DP/PT pulses, no edema, no varicosities, no cyanosis, no clubbing SKIN: warm, nondiaphoretic, normal turgor, no ulcers NEUROPSYCH: alert, oriented to person, place, and time, sensory/motor grossly intact, normal mood, appropriate affect  Recent Labs: 12/09/2015: ALT 6 12/10/2015: BUN 6; Creatinine, Ser 0.80; Hemoglobin 11.2; Platelets 223; Potassium 4.3; Sodium 138   Lipid Panel    Component Value Date/Time   CHOL 65 08/21/2014 0400   TRIG 63 08/21/2014 0400   HDL 28 (L) 08/21/2014 0400   VLDL 13 08/21/2014 0400   LDLCALC 24 08/21/2014 0400     Other studies Reviewed:  EKG:  The ekg from *** was personally reviewed by me and it revealed ***  Additional studies/ records that were reviewed personally  reviewed by me today include: ***   ASSESSMENT AND PLAN:  Plan  1 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 possibly thrombus or emboli patient refused TEE is here for follow-up 2 weakness worse on the left side related to recent CVA continue aspirin Plavix statin 3 under concerned about paroxysmal atrial fibrillation will order an event monitor looking for reason for is recent bilateral CVAs 4Hypertension continue current therapy patient is on metoprolol will increase the dose to 50 mg once a day 5 tachycardia so for appears to be sinus some concern about paroxysmal AFib will control rate with metoprolol by increasing the dose to 50 mg once a day 6Hyperlipidemia continue Lipitor therapy at 20 mg a day recommend lipid and liver studies  9Noncompliance I am worried that the patient does not follow all of the advice given to him by medical professional will continue to counsel about the need to follow recommendations 10 will prescribed the all his medications so he can have and field at [facility] to maybe help with the expense 11 have the patient follow-up in 3 months      Current medicines are reviewed at length with the patient today.  The patient {ACTIONS; HAS/DOES NOT HAVE:[id number]} concerns regarding medicines.  Labs/ tests ordered today include: No orders of the defined types were placed in this encounter.   I had a lengthy and detailed discussion with the patient regarding diagnoses, prognosis, diagnostic options, treatment options ***, and side effects of medications.   I counseled the patient on importance of lifestyle modification including heart healthy diet, regular physical activity *** , and smoking cessation.   Disposition:   FU with undersigned after tests ***   Signed, Almond Lint, MD  08/06/2016 12:01 PM    [facility]  This note was generated in part with voice recognition software and I apologize for any typographical errors that were not detected and  corrected.

## 2016-08-13 ENCOUNTER — Ambulatory Visit: Payer: Medicaid Other | Admitting: Cardiology

## 2016-08-14 ENCOUNTER — Telehealth: Payer: Self-pay | Admitting: Cardiology

## 2016-08-14 ENCOUNTER — Encounter: Payer: Self-pay | Admitting: Cardiology

## 2016-08-14 NOTE — Telephone Encounter (Signed)
Patient was a no show for appointment with Dr. Alvino ChapelIngal 08/13/16. Medical records received for this visit placed in "Save" bin on [employer]'s desk.

## 2022-06-06 DIAGNOSIS — I639 Cerebral infarction, unspecified: Secondary | ICD-10-CM | POA: Diagnosis not present

## 2022-06-06 DIAGNOSIS — I10 Essential (primary) hypertension: Secondary | ICD-10-CM | POA: Diagnosis not present

## 2022-06-06 DIAGNOSIS — E559 Vitamin D deficiency, unspecified: Secondary | ICD-10-CM | POA: Diagnosis not present

## 2022-06-06 DIAGNOSIS — Z1283 Encounter for screening for malignant neoplasm of skin: Secondary | ICD-10-CM | POA: Diagnosis not present

## 2022-06-06 DIAGNOSIS — Z1211 Encounter for screening for malignant neoplasm of colon: Secondary | ICD-10-CM | POA: Diagnosis not present

## 2022-10-04 DIAGNOSIS — I639 Cerebral infarction, unspecified: Secondary | ICD-10-CM | POA: Diagnosis not present

## 2022-10-04 DIAGNOSIS — F5101 Primary insomnia: Secondary | ICD-10-CM | POA: Diagnosis not present

## 2022-10-04 DIAGNOSIS — I10 Essential (primary) hypertension: Secondary | ICD-10-CM | POA: Diagnosis not present

## 2022-10-04 DIAGNOSIS — K5190 Ulcerative colitis, unspecified, without complications: Secondary | ICD-10-CM | POA: Diagnosis not present

## 2023-02-04 DIAGNOSIS — Z8673 Personal history of transient ischemic attack (TIA), and cerebral infarction without residual deficits: Secondary | ICD-10-CM | POA: Diagnosis not present

## 2023-02-04 DIAGNOSIS — F5101 Primary insomnia: Secondary | ICD-10-CM | POA: Diagnosis not present

## 2023-02-04 DIAGNOSIS — E559 Vitamin D deficiency, unspecified: Secondary | ICD-10-CM | POA: Diagnosis not present

## 2023-02-04 DIAGNOSIS — K5190 Ulcerative colitis, unspecified, without complications: Secondary | ICD-10-CM | POA: Diagnosis not present

## 2023-06-05 DIAGNOSIS — I10 Essential (primary) hypertension: Secondary | ICD-10-CM | POA: Diagnosis not present

## 2023-06-05 DIAGNOSIS — I639 Cerebral infarction, unspecified: Secondary | ICD-10-CM | POA: Diagnosis not present

## 2023-06-05 DIAGNOSIS — K5190 Ulcerative colitis, unspecified, without complications: Secondary | ICD-10-CM | POA: Diagnosis not present

## 2023-07-06 ENCOUNTER — Emergency Department
Admission: EM | Admit: 2023-07-06 | Discharge: 2023-07-06 | Disposition: A | Discharge status: Home / Self Care | Payer: 59 | Attending: Emergency Medicine | Admitting: Emergency Medicine

## 2023-07-06 ENCOUNTER — Other Ambulatory Visit: Payer: Self-pay

## 2023-07-06 DIAGNOSIS — I499 Cardiac arrhythmia, unspecified: Secondary | ICD-10-CM | POA: Diagnosis not present

## 2023-07-06 DIAGNOSIS — R195 Other fecal abnormalities: Secondary | ICD-10-CM | POA: Diagnosis present

## 2023-07-06 DIAGNOSIS — Z743 Need for continuous supervision: Secondary | ICD-10-CM | POA: Diagnosis not present

## 2023-07-06 DIAGNOSIS — K51911 Ulcerative colitis, unspecified with rectal bleeding: Secondary | ICD-10-CM

## 2023-07-06 DIAGNOSIS — K5190 Ulcerative colitis, unspecified, without complications: Secondary | ICD-10-CM | POA: Diagnosis not present

## 2023-07-06 DIAGNOSIS — R109 Unspecified abdominal pain: Secondary | ICD-10-CM | POA: Diagnosis not present

## 2023-07-06 DIAGNOSIS — E860 Dehydration: Secondary | ICD-10-CM | POA: Diagnosis not present

## 2023-07-06 DIAGNOSIS — R58 Hemorrhage, not elsewhere classified: Secondary | ICD-10-CM | POA: Diagnosis not present

## 2023-07-06 LAB — COMPREHENSIVE METABOLIC PANEL
ALT: 14 U/L (ref 0–44)
AST: 18 U/L (ref 15–41)
Albumin: 3.6 g/dL (ref 3.5–5.0)
Alkaline Phosphatase: 60 U/L (ref 38–126)
Anion gap: 11 (ref 5–15)
BUN: 12 mg/dL (ref 6–20)
CO2: 22 mmol/L (ref 22–32)
Calcium: 9 mg/dL (ref 8.9–10.3)
Chloride: 102 mmol/L (ref 98–111)
Creatinine, Ser: 1.22 mg/dL (ref 0.61–1.24)
GFR, Estimated: >60 mL/min (ref >60)
Glucose, Bld: 136 mg/dL — ABNORMAL HIGH (ref 70–99)
Potassium: 3.3 mmol/L — ABNORMAL LOW (ref 3.5–5.1)
Sodium: 135 mmol/L (ref 135–145)
Total Bilirubin: 1.2 mg/dL (ref 0.3–1.2)
Total Protein: 7 g/dL (ref 6.5–8.1)

## 2023-07-06 LAB — CBC
HCT: 51.6 % (ref 39.0–52.0)
Hemoglobin: 17.3 g/dL — ABNORMAL HIGH (ref 13.0–17.0)
MCH: 28 pg (ref 26.0–34.0)
MCHC: 33.5 g/dL (ref 30.0–36.0)
MCV: 83.5 fL (ref 80.0–100.0)
Platelets: 299 10*3/uL (ref 150–400)
RBC: 6.18 MIL/uL — ABNORMAL HIGH (ref 4.22–5.81)
RDW: 15.6 % — ABNORMAL HIGH (ref 11.5–15.5)
WBC: 9.6 10*3/uL (ref 4.0–10.5)
nRBC: 0 % (ref 0.0–0.2)

## 2023-07-06 LAB — TYPE AND SCREEN
ABO/RH(D): O POS
Antibody Screen: POSITIVE

## 2023-07-06 LAB — PROTIME-INR
INR: 1.1 (ref 0.8–1.2)
Prothrombin Time: 14.4 seconds (ref 11.4–15.2)

## 2023-07-06 LAB — APTT: aPTT: 28 seconds (ref 24–36)

## 2023-07-06 MED ORDER — METHYLPREDNISOLONE SODIUM SUCC 125 MG IJ SOLR
125.0000 mg | Freq: Once | INTRAMUSCULAR | Status: AC
  Administered 2023-07-06: 125 mg via INTRAVENOUS
  Filled 2023-07-06: qty 2

## 2023-07-06 MED ORDER — PREDNISONE 10 MG PO TABS: ORAL_TABLET | ORAL | 0 refills | Status: AC

## 2023-07-06 NOTE — ED Provider Notes (Signed)
[facility] Provider Note    Event Date/Time   First MD Initiated Contact with Patient 07/06/23 [phone]     (approximate)   History   Blood In Stools   HPI  [name] is a 62 y.o. male with a history of ulcerative colitis presents with complaints of bloody stools x 2 weeks.  He is feeling weak and slightly lightheaded when he stands.  No syncope reported.  No nausea or vomiting.  Does have crampy abdominal pain.  Review of records demonstrates admission for GI bleed requiring packed red blood cells in 2017     Physical Exam   Triage Vital Signs: ED Triage Vitals  Encounter Vitals Group     BP 07/06/23 0941 (!) 126/105     Systolic BP Percentile --      Diastolic BP Percentile --      Pulse Rate 07/06/23 0941 (!) 108     Resp 07/06/23 0941 17     Temp 07/06/23 0941 97.8 F (36.6 C)     Temp Source 07/06/23 0941 Oral     SpO2 07/06/23 0941 98 %     Weight 07/06/23 0942 72.6 kg (160 lb 0.9 oz)     Height 07/06/23 0942 1.778 m (5\' 10" )     Head Circumference --      Peak Flow --      Pain Score --      Pain Loc --      Pain Education --      Exclude from Growth Chart --     Most recent vital signs: Vitals:   07/06/23 1030 07/06/23 1121  BP: (!) 112/92 (!) 120/95  Pulse: 95 100  Resp: (!) 25 18  Temp:  98.3 F (36.8 C)  SpO2: 96% 99%     General: Awake, no distress.  CV:  Good peripheral perfusion.  Resp:  Normal effort.  Abd:  No distention.  Soft, nontender Other:     ED Results / Procedures / Treatments   Labs (all labs ordered are listed, but only abnormal results are displayed) Labs Reviewed  CBC - Abnormal; Notable for the following components:      Result Value   RBC 6.18 (*)    Hemoglobin 17.3 (*)    RDW 15.6 (*)    All other components within normal limits  COMPREHENSIVE METABOLIC PANEL - Abnormal; Notable for the following components:   Potassium 3.3 (*)    Glucose, Bld 136 (*)    All other components  within normal limits  APTT  PROTIME-INR  TYPE AND SCREEN     EKG     RADIOLOGY     PROCEDURES:  Critical Care performed:   Procedures   MEDICATIONS ORDERED IN ED: Medications  methylPREDNISolone sodium succinate (SOLU-MEDROL) 125 mg/2 mL injection 125 mg (125 mg Intravenous Given 07/06/23 1122)     IMPRESSION / MDM / ASSESSMENT AND PLAN / ED COURSE  I reviewed the triage vital signs and the nursing notes. Patient's presentation is most consistent with acute presentation with potential threat to life or bodily function.  Patient presents with bloody stools as detailed above, given his history concerning for bleeding related to ulcerative colitis.  He is required blood transfusions in the past.  Type and screen, coags, CBC, CMP sent, IV  Patient's hemoglobin is reassuring at 17.3.  Lab work otherwise unremarkable.  Presentation is consistent with ulcerative colitis flare, will start the patient on steroid taper.  125  mg of Solu-Medrol given here IV  Close outpatient follow-up recommended, return precautions discussed, patient and family agree with this plan      FINAL CLINICAL IMPRESSION(S) / ED DIAGNOSES   Final diagnoses:  Ulcerative colitis with rectal bleeding, unspecified location [facility])     Rx / DC Orders   ED Discharge Orders          Ordered    predniSONE (DELTASONE) 10 MG tablet  Daily        07/06/23 1113             Note:  This document was prepared using Dragon voice recognition software and may include unintentional dictation errors.   Jene Every, MD 07/06/23 [phone]

## 2023-07-06 NOTE — ED Triage Notes (Signed)
Pt brought in by EMS for bloody stools x 2 weeks.

## 2023-07-11 ENCOUNTER — Telehealth: Payer: Self-pay | Admitting: *Deleted

## 2023-07-11 NOTE — Telephone Encounter (Signed)
Transition Care Management Unsuccessful Follow-up Telephone Call  Date of discharge and from where:  [facility] 07/06/2023  Attempts:  1st Attempt  Reason for unsuccessful TCM follow-up call:  No answer/busy

## 2023-07-14 ENCOUNTER — Telehealth: Payer: Self-pay | Admitting: *Deleted

## 2023-07-14 NOTE — Telephone Encounter (Signed)
Transition Care Management Unsuccessful Follow-up Telephone Call  Date of discharge and from where:  [facility] 07/06/2023  Attempts:  2nd Attempt  Reason for unsuccessful TCM follow-up call:  Left voice message

## 2023-08-04 ENCOUNTER — Emergency Department: Payer: 59

## 2023-08-04 ENCOUNTER — Inpatient Hospital Stay: Payer: 59

## 2023-08-04 ENCOUNTER — Other Ambulatory Visit: Payer: Self-pay

## 2023-08-04 ENCOUNTER — Inpatient Hospital Stay
Admission: EM | Admit: 2023-08-04 | Discharge: 2023-08-09 | DRG: 871 | Disposition: A | Discharge status: Skilled Nursing Facility | Payer: 59 | Attending: Internal Medicine | Admitting: Internal Medicine

## 2023-08-04 DIAGNOSIS — R634 Abnormal weight loss: Secondary | ICD-10-CM | POA: Diagnosis not present

## 2023-08-04 DIAGNOSIS — Y92009 Unspecified place in unspecified non-institutional (private) residence as the place of occurrence of the external cause: Secondary | ICD-10-CM

## 2023-08-04 DIAGNOSIS — I10 Essential (primary) hypertension: Secondary | ICD-10-CM | POA: Diagnosis present

## 2023-08-04 DIAGNOSIS — E860 Dehydration: Secondary | ICD-10-CM | POA: Diagnosis not present

## 2023-08-04 DIAGNOSIS — K625 Hemorrhage of anus and rectum: Secondary | ICD-10-CM | POA: Diagnosis not present

## 2023-08-04 DIAGNOSIS — E785 Hyperlipidemia, unspecified: Secondary | ICD-10-CM | POA: Diagnosis present

## 2023-08-04 DIAGNOSIS — E43 Unspecified severe protein-calorie malnutrition: Secondary | ICD-10-CM | POA: Diagnosis present

## 2023-08-04 DIAGNOSIS — R1312 Dysphagia, oropharyngeal phase: Secondary | ICD-10-CM | POA: Diagnosis not present

## 2023-08-04 DIAGNOSIS — W1830XA Fall on same level, unspecified, initial encounter: Secondary | ICD-10-CM | POA: Diagnosis present

## 2023-08-04 DIAGNOSIS — Z8249 Family history of ischemic heart disease and other diseases of the circulatory system: Secondary | ICD-10-CM

## 2023-08-04 DIAGNOSIS — R7989 Other specified abnormal findings of blood chemistry: Secondary | ICD-10-CM | POA: Diagnosis present

## 2023-08-04 DIAGNOSIS — S01411D Laceration without foreign body of right cheek and temporomandibular area, subsequent encounter: Secondary | ICD-10-CM | POA: Diagnosis not present

## 2023-08-04 DIAGNOSIS — S40812A Abrasion of left upper arm, initial encounter: Secondary | ICD-10-CM | POA: Diagnosis not present

## 2023-08-04 DIAGNOSIS — Z743 Need for continuous supervision: Secondary | ICD-10-CM | POA: Diagnosis not present

## 2023-08-04 DIAGNOSIS — N179 Acute kidney failure, unspecified: Secondary | ICD-10-CM | POA: Diagnosis present

## 2023-08-04 DIAGNOSIS — G9341 Metabolic encephalopathy: Secondary | ICD-10-CM | POA: Diagnosis present

## 2023-08-04 DIAGNOSIS — R4781 Slurred speech: Secondary | ICD-10-CM | POA: Diagnosis not present

## 2023-08-04 DIAGNOSIS — I5A Non-ischemic myocardial injury (non-traumatic): Secondary | ICD-10-CM | POA: Diagnosis not present

## 2023-08-04 DIAGNOSIS — T796XXA Traumatic ischemia of muscle, initial encounter: Secondary | ICD-10-CM | POA: Diagnosis not present

## 2023-08-04 DIAGNOSIS — K529 Noninfective gastroenteritis and colitis, unspecified: Secondary | ICD-10-CM | POA: Diagnosis not present

## 2023-08-04 DIAGNOSIS — R404 Transient alteration of awareness: Secondary | ICD-10-CM | POA: Diagnosis not present

## 2023-08-04 DIAGNOSIS — N200 Calculus of kidney: Secondary | ICD-10-CM | POA: Diagnosis not present

## 2023-08-04 DIAGNOSIS — S199XXA Unspecified injury of neck, initial encounter: Secondary | ICD-10-CM | POA: Diagnosis not present

## 2023-08-04 DIAGNOSIS — S8002XA Contusion of left knee, initial encounter: Secondary | ICD-10-CM | POA: Diagnosis not present

## 2023-08-04 DIAGNOSIS — R55 Syncope and collapse: Secondary | ICD-10-CM | POA: Diagnosis not present

## 2023-08-04 DIAGNOSIS — E871 Hypo-osmolality and hyponatremia: Secondary | ICD-10-CM | POA: Diagnosis not present

## 2023-08-04 DIAGNOSIS — R531 Weakness: Secondary | ICD-10-CM | POA: Diagnosis not present

## 2023-08-04 DIAGNOSIS — R918 Other nonspecific abnormal finding of lung field: Secondary | ICD-10-CM | POA: Diagnosis not present

## 2023-08-04 DIAGNOSIS — R911 Solitary pulmonary nodule: Secondary | ICD-10-CM | POA: Diagnosis present

## 2023-08-04 DIAGNOSIS — S51011D Laceration without foreign body of right elbow, subsequent encounter: Secondary | ICD-10-CM | POA: Diagnosis not present

## 2023-08-04 DIAGNOSIS — I639 Cerebral infarction, unspecified: Secondary | ICD-10-CM | POA: Diagnosis present

## 2023-08-04 DIAGNOSIS — K5190 Ulcerative colitis, unspecified, without complications: Secondary | ICD-10-CM | POA: Diagnosis not present

## 2023-08-04 DIAGNOSIS — Z7401 Bed confinement status: Secondary | ICD-10-CM | POA: Diagnosis not present

## 2023-08-04 DIAGNOSIS — L89150 Pressure ulcer of sacral region, unstageable: Secondary | ICD-10-CM | POA: Diagnosis not present

## 2023-08-04 DIAGNOSIS — R4182 Altered mental status, unspecified: Principal | ICD-10-CM

## 2023-08-04 DIAGNOSIS — R7401 Elevation of levels of liver transaminase levels: Secondary | ICD-10-CM | POA: Diagnosis not present

## 2023-08-04 DIAGNOSIS — N390 Urinary tract infection, site not specified: Secondary | ICD-10-CM | POA: Diagnosis present

## 2023-08-04 DIAGNOSIS — S0011XA Contusion of right eyelid and periocular area, initial encounter: Secondary | ICD-10-CM | POA: Diagnosis not present

## 2023-08-04 DIAGNOSIS — R6510 Systemic inflammatory response syndrome (SIRS) of non-infectious origin without acute organ dysfunction: Secondary | ICD-10-CM | POA: Diagnosis not present

## 2023-08-04 DIAGNOSIS — M19011 Primary osteoarthritis, right shoulder: Secondary | ICD-10-CM | POA: Diagnosis not present

## 2023-08-04 DIAGNOSIS — Z6823 Body mass index (BMI) 23.0-23.9, adult: Secondary | ICD-10-CM

## 2023-08-04 DIAGNOSIS — M6282 Rhabdomyolysis: Secondary | ICD-10-CM | POA: Diagnosis not present

## 2023-08-04 DIAGNOSIS — F1090 Alcohol use, unspecified, uncomplicated: Secondary | ICD-10-CM | POA: Diagnosis present

## 2023-08-04 DIAGNOSIS — K922 Gastrointestinal hemorrhage, unspecified: Secondary | ICD-10-CM | POA: Diagnosis present

## 2023-08-04 DIAGNOSIS — R278 Other lack of coordination: Secondary | ICD-10-CM | POA: Diagnosis not present

## 2023-08-04 DIAGNOSIS — S40811A Abrasion of right upper arm, initial encounter: Secondary | ICD-10-CM | POA: Diagnosis present

## 2023-08-04 DIAGNOSIS — I2489 Other forms of acute ischemic heart disease: Secondary | ICD-10-CM | POA: Diagnosis present

## 2023-08-04 DIAGNOSIS — R0602 Shortness of breath: Secondary | ICD-10-CM | POA: Diagnosis not present

## 2023-08-04 DIAGNOSIS — Z880 Allergy status to penicillin: Secondary | ICD-10-CM

## 2023-08-04 DIAGNOSIS — A419 Sepsis, unspecified organism: Secondary | ICD-10-CM | POA: Diagnosis present

## 2023-08-04 DIAGNOSIS — E8809 Other disorders of plasma-protein metabolism, not elsewhere classified: Secondary | ICD-10-CM | POA: Diagnosis not present

## 2023-08-04 DIAGNOSIS — Z789 Other specified health status: Secondary | ICD-10-CM | POA: Diagnosis present

## 2023-08-04 DIAGNOSIS — S0990XA Unspecified injury of head, initial encounter: Secondary | ICD-10-CM

## 2023-08-04 DIAGNOSIS — Z8673 Personal history of transient ischemic attack (TIA), and cerebral infarction without residual deficits: Secondary | ICD-10-CM

## 2023-08-04 DIAGNOSIS — M6281 Muscle weakness (generalized): Secondary | ICD-10-CM | POA: Diagnosis not present

## 2023-08-04 DIAGNOSIS — S0081XA Abrasion of other part of head, initial encounter: Secondary | ICD-10-CM | POA: Diagnosis present

## 2023-08-04 DIAGNOSIS — T699XXA Effect of reduced temperature, unspecified, initial encounter: Secondary | ICD-10-CM | POA: Diagnosis not present

## 2023-08-04 DIAGNOSIS — I499 Cardiac arrhythmia, unspecified: Secondary | ICD-10-CM | POA: Diagnosis not present

## 2023-08-04 DIAGNOSIS — E872 Acidosis, unspecified: Secondary | ICD-10-CM | POA: Diagnosis not present

## 2023-08-04 DIAGNOSIS — Z043 Encounter for examination and observation following other accident: Secondary | ICD-10-CM | POA: Diagnosis not present

## 2023-08-04 DIAGNOSIS — E041 Nontoxic single thyroid nodule: Secondary | ICD-10-CM | POA: Diagnosis not present

## 2023-08-04 DIAGNOSIS — R6520 Severe sepsis without septic shock: Secondary | ICD-10-CM | POA: Diagnosis present

## 2023-08-04 DIAGNOSIS — Z79899 Other long term (current) drug therapy: Secondary | ICD-10-CM

## 2023-08-04 DIAGNOSIS — R000 Tachycardia, unspecified: Secondary | ICD-10-CM | POA: Diagnosis not present

## 2023-08-04 DIAGNOSIS — Z8719 Personal history of other diseases of the digestive system: Secondary | ICD-10-CM | POA: Diagnosis not present

## 2023-08-04 DIAGNOSIS — G9389 Other specified disorders of brain: Secondary | ICD-10-CM | POA: Diagnosis not present

## 2023-08-04 DIAGNOSIS — W19XXXA Unspecified fall, initial encounter: Secondary | ICD-10-CM

## 2023-08-04 DIAGNOSIS — R6889 Other general symptoms and signs: Secondary | ICD-10-CM | POA: Diagnosis not present

## 2023-08-04 DIAGNOSIS — K51911 Ulcerative colitis, unspecified with rectal bleeding: Secondary | ICD-10-CM | POA: Diagnosis present

## 2023-08-04 DIAGNOSIS — R29818 Other symptoms and signs involving the nervous system: Secondary | ICD-10-CM | POA: Diagnosis not present

## 2023-08-04 DIAGNOSIS — Z881 Allergy status to other antibiotic agents status: Secondary | ICD-10-CM

## 2023-08-04 DIAGNOSIS — Z7902 Long term (current) use of antithrombotics/antiplatelets: Secondary | ICD-10-CM

## 2023-08-04 DIAGNOSIS — K51919 Ulcerative colitis, unspecified with unspecified complications: Secondary | ICD-10-CM

## 2023-08-04 DIAGNOSIS — R9089 Other abnormal findings on diagnostic imaging of central nervous system: Secondary | ICD-10-CM | POA: Diagnosis not present

## 2023-08-04 LAB — CBC WITH DIFFERENTIAL/PLATELET
Abs Immature Granulocytes: 0.52 10*3/uL — ABNORMAL HIGH (ref 0.00–0.07)
Basophils Absolute: 0.1 10*3/uL (ref 0.0–0.1)
Basophils Relative: 1 %
Eosinophils Absolute: 0 10*3/uL (ref 0.0–0.5)
Eosinophils Relative: 0 %
HCT: 42.3 % (ref 39.0–52.0)
Hemoglobin: 14.3 g/dL (ref 13.0–17.0)
Immature Granulocytes: 3 %
Lymphocytes Relative: 4 %
Lymphs Abs: 0.7 10*3/uL (ref 0.7–4.0)
MCH: 28.5 pg (ref 26.0–34.0)
MCHC: 33.8 g/dL (ref 30.0–36.0)
MCV: 84.3 fL (ref 80.0–100.0)
Monocytes Absolute: 0.9 10*3/uL (ref 0.1–1.0)
Monocytes Relative: 5 %
Neutro Abs: 16.2 10*3/uL — ABNORMAL HIGH (ref 1.7–7.7)
Neutrophils Relative %: 87 %
Platelets: 361 10*3/uL (ref 150–400)
RBC: 5.02 MIL/uL (ref 4.22–5.81)
RDW: 15.7 % — ABNORMAL HIGH (ref 11.5–15.5)
Smear Review: NORMAL
WBC: 18.5 10*3/uL — ABNORMAL HIGH (ref 4.0–10.5)
nRBC: 0 % (ref 0.0–0.2)

## 2023-08-04 LAB — CBC
HCT: 35.4 % — ABNORMAL LOW (ref 39.0–52.0)
Hemoglobin: 12.4 g/dL — ABNORMAL LOW (ref 13.0–17.0)
MCH: 29 pg (ref 26.0–34.0)
MCHC: 35 g/dL (ref 30.0–36.0)
MCV: 82.7 fL (ref 80.0–100.0)
Platelets: 267 10*3/uL (ref 150–400)
RBC: 4.28 MIL/uL (ref 4.22–5.81)
RDW: 15.6 % — ABNORMAL HIGH (ref 11.5–15.5)
WBC: 14.6 10*3/uL — ABNORMAL HIGH (ref 4.0–10.5)
nRBC: 0 % (ref 0.0–0.2)

## 2023-08-04 LAB — LACTIC ACID, PLASMA
Lactic Acid, Venous: 4.8 mmol/L (ref 0.5–1.9)
Lactic Acid, Venous: 2.4 mmol/L (ref 0.5–1.9)
Lactic Acid, Venous: 1.9 mmol/L (ref 0.5–1.9)

## 2023-08-04 LAB — URINALYSIS, W/ REFLEX TO CULTURE (INFECTION SUSPECTED)
Bilirubin Urine: NEGATIVE
Glucose, UA: NEGATIVE mg/dL
Ketones, ur: 5 mg/dL — AB
Leukocytes,Ua: NEGATIVE
Nitrite: NEGATIVE
Protein, ur: 100 mg/dL — AB
Specific Gravity, Urine: 1.023 (ref 1.005–1.030)
Squamous Epithelial / HPF: NONE SEEN /HPF (ref 0–5)
pH: 5 (ref 5.0–8.0)

## 2023-08-04 LAB — LIPID PANEL
Cholesterol: 88 mg/dL (ref 0–200)
HDL: 39 mg/dL — ABNORMAL LOW (ref >40)
LDL Cholesterol: 28 mg/dL (ref 0–99)
Total CHOL/HDL Ratio: 2.3 RATIO
Triglycerides: 105 mg/dL (ref <150)
VLDL: 21 mg/dL (ref 0–40)

## 2023-08-04 LAB — CK: Total CK: 2659 U/L — ABNORMAL HIGH (ref 49–397)

## 2023-08-04 LAB — MAGNESIUM: Magnesium: 2.6 mg/dL — ABNORMAL HIGH (ref 1.7–2.4)

## 2023-08-04 LAB — COMPREHENSIVE METABOLIC PANEL
ALT: 111 U/L — ABNORMAL HIGH (ref 0–44)
AST: 154 U/L — ABNORMAL HIGH (ref 15–41)
Albumin: 2.4 g/dL — ABNORMAL LOW (ref 3.5–5.0)
Alkaline Phosphatase: 51 U/L (ref 38–126)
Anion gap: 17 — ABNORMAL HIGH (ref 5–15)
BUN: 47 mg/dL — ABNORMAL HIGH (ref 6–20)
CO2: 17 mmol/L — ABNORMAL LOW (ref 22–32)
Calcium: 7.7 mg/dL — ABNORMAL LOW (ref 8.9–10.3)
Chloride: 101 mmol/L (ref 98–111)
Creatinine, Ser: 4.15 mg/dL — ABNORMAL HIGH (ref 0.61–1.24)
GFR, Estimated: 16 mL/min — ABNORMAL LOW (ref >60)
Glucose, Bld: 117 mg/dL — ABNORMAL HIGH (ref 70–99)
Potassium: 4.1 mmol/L (ref 3.5–5.1)
Sodium: 135 mmol/L (ref 135–145)
Total Bilirubin: 0.8 mg/dL (ref 0.3–1.2)
Total Protein: 5.4 g/dL — ABNORMAL LOW (ref 6.5–8.1)

## 2023-08-04 LAB — APTT: aPTT: 29 seconds (ref 24–36)

## 2023-08-04 LAB — TROPONIN I (HIGH SENSITIVITY)
Troponin I (High Sensitivity): 36 ng/L — ABNORMAL HIGH (ref <18)
Troponin I (High Sensitivity): 42 ng/L — ABNORMAL HIGH (ref <18)
Troponin I (High Sensitivity): 46 ng/L — ABNORMAL HIGH (ref <18)

## 2023-08-04 LAB — URINE DRUG SCREEN, QUALITATIVE (ARMC ONLY)
Amphetamines, Ur Screen: NOT DETECTED
Barbiturates, Ur Screen: NOT DETECTED
Benzodiazepine, Ur Scrn: NOT DETECTED
Cannabinoid 50 Ng, Ur ~~LOC~~: POSITIVE — AB
Cocaine Metabolite,Ur ~~LOC~~: NOT DETECTED
MDMA (Ecstasy)Ur Screen: NOT DETECTED
Methadone Scn, Ur: NOT DETECTED
Opiate, Ur Screen: NOT DETECTED
Phencyclidine (PCP) Ur S: NOT DETECTED
Tricyclic, Ur Screen: NOT DETECTED

## 2023-08-04 LAB — PROTIME-INR
INR: 1.2 (ref 0.8–1.2)
Prothrombin Time: 15.8 seconds — ABNORMAL HIGH (ref 11.4–15.2)

## 2023-08-04 LAB — PROCALCITONIN: Procalcitonin: 1.74 ng/mL

## 2023-08-04 MED ORDER — FOLIC ACID 1 MG PO TABS
1.0000 mg | ORAL_TABLET | Freq: Every day | ORAL | Status: DC
  Administered 2023-08-04 – 2023-08-09 (×6): 1 mg via ORAL
  Filled 2023-08-04 (×6): qty 1

## 2023-08-04 MED ORDER — HYDRALAZINE HCL 20 MG/ML IJ SOLN: 5.0000 mg | INTRAMUSCULAR | Status: DC | PRN

## 2023-08-04 MED ORDER — LACTATED RINGERS IV SOLN: INTRAVENOUS | Status: DC

## 2023-08-04 MED ORDER — LORAZEPAM 2 MG/ML IJ SOLN: 1.0000 mg | INTRAMUSCULAR | Status: DC | PRN

## 2023-08-04 MED ORDER — METRONIDAZOLE 500 MG/100ML IV SOLN
500.0000 mg | Freq: Two times a day (BID) | INTRAVENOUS | Status: DC
  Administered 2023-08-04 – 2023-08-07 (×6): 500 mg via INTRAVENOUS
  Filled 2023-08-04 (×8): qty 100

## 2023-08-04 MED ORDER — LACTATED RINGERS IV BOLUS (SEPSIS)
250.0000 mL | Freq: Once | INTRAVENOUS | Status: AC
  Administered 2023-08-04: 250 mL via INTRAVENOUS

## 2023-08-04 MED ORDER — THIAMINE HCL 100 MG/ML IJ SOLN: 100.0000 mg | Freq: Every day | INTRAMUSCULAR | Status: DC

## 2023-08-04 MED ORDER — CIPROFLOXACIN IN D5W 400 MG/200ML IV SOLN
400.0000 mg | INTRAVENOUS | Status: DC
  Administered 2023-08-05 – 2023-08-06 (×2): 400 mg via INTRAVENOUS
  Filled 2023-08-04 (×3): qty 200

## 2023-08-04 MED ORDER — LORAZEPAM 2 MG/ML IJ SOLN
0.0000 mg | Freq: Two times a day (BID) | INTRAMUSCULAR | Status: DC
  Filled 2023-08-04: qty 1

## 2023-08-04 MED ORDER — ADULT MULTIVITAMIN W/MINERALS CH
1.0000 | ORAL_TABLET | Freq: Every day | ORAL | Status: DC
  Administered 2023-08-04 – 2023-08-09 (×6): 1 via ORAL
  Filled 2023-08-04 (×6): qty 1

## 2023-08-04 MED ORDER — LACTATED RINGERS IV BOLUS
500.0000 mL | Freq: Once | INTRAVENOUS | Status: AC
  Administered 2023-08-04: 500 mL via INTRAVENOUS

## 2023-08-04 MED ORDER — LEVOFLOXACIN IN D5W 750 MG/150ML IV SOLN
750.0000 mg | Freq: Once | INTRAVENOUS | Status: AC
  Administered 2023-08-04: 750 mg via INTRAVENOUS
  Filled 2023-08-04: qty 150

## 2023-08-04 MED ORDER — LORAZEPAM 1 MG PO TABS: 1.0000 mg | ORAL_TABLET | ORAL | Status: DC | PRN

## 2023-08-04 MED ORDER — THIAMINE MONONITRATE 100 MG PO TABS
100.0000 mg | ORAL_TABLET | Freq: Every day | ORAL | Status: DC
  Administered 2023-08-04 – 2023-08-09 (×6): 100 mg via ORAL
  Filled 2023-08-04 (×6): qty 1

## 2023-08-04 MED ORDER — LACTATED RINGERS IV BOLUS (SEPSIS)
1000.0000 mL | Freq: Once | INTRAVENOUS | Status: AC
  Administered 2023-08-04: 1000 mL via INTRAVENOUS

## 2023-08-04 MED ORDER — MORPHINE SULFATE (PF) 2 MG/ML IV SOLN: 1.0000 mg | INTRAVENOUS | Status: DC | PRN

## 2023-08-04 MED ORDER — LORAZEPAM 2 MG/ML IJ SOLN: 0.0000 mg | Freq: Four times a day (QID) | INTRAMUSCULAR | Status: DC

## 2023-08-04 MED ORDER — ONDANSETRON HCL 4 MG/2ML IJ SOLN
4.0000 mg | Freq: Three times a day (TID) | INTRAMUSCULAR | Status: DC | PRN
  Administered 2023-08-04: 4 mg via INTRAVENOUS
  Filled 2023-08-04: qty 2

## 2023-08-04 MED ORDER — PANTOPRAZOLE SODIUM 40 MG IV SOLR
40.0000 mg | Freq: Two times a day (BID) | INTRAVENOUS | Status: DC
  Administered 2023-08-04 – 2023-08-07 (×6): 40 mg via INTRAVENOUS
  Filled 2023-08-04 (×5): qty 10

## 2023-08-04 MED ORDER — SODIUM CHLORIDE 0.9 % IV BOLUS
1000.0000 mL | Freq: Once | INTRAVENOUS | Status: AC
  Administered 2023-08-04: 1000 mL via INTRAVENOUS

## 2023-08-04 NOTE — Sepsis Progress Note (Signed)
[facility] will follow per sepsis protocol  

## 2023-08-04 NOTE — H&P (Signed)
History and Physical    [name] ZOX:[id number] DOB: 01-31-1961 DOA: 08/04/2023  Referring MD/NP/PA:   PCP: Armando Gang, FNP   Patient coming from:  The patient is coming from home.     Chief Complaint: AMS, fall and GI bleeding  HPI: [name] is a 62 y.o. male with medical history significant of stroke with chronic right hand numbness, ulcerative colitis, GIB, HTN, HLD, alcohol use, marijuana abuse, who presents with altered mental status, fall and GI bleeding.  Patient has AMS, and is unable to provide accurate medical history, therefore, most of the history is obtained by discussing the case with ED physician, per EMS report, and with the nursing staff. I also called his girlfriend by phone who provided some medical history.  Per her girlfriend, patient has history of stroke with chronic right hand numbness.  He had slurred speech when he had stroke in the past which has resolved.  Girlfriend states that patient has intermittent rectal bleeding with dark stool for more than a month.  At her normal baseline, patient is alert and orientated x 3.  Girlfriend states that patient drinks beer and liquor almost every day.  Per EDP, pt had fall 3 days ago and his brother had not heard from pt for 3 days so he went over to check on him, and found pt on the ground. Last known well was approximately 3 days ago.  Patient has generalized weakness, is unable to get up or ambulate. Pt was covered in bloody stool. He has multiple skin bruises in his right cheek, bilateral knees, both forearms.  He has several small skin tear in left forearm.  When I saw pt in ED, patient is alert, but confused.  He knows his own name, not orientated to the time and place.  He has symmetrical weakness in both legs, moves his arms normally.  He has slurred speech which is new.  No facial droop noted.  No active nausea, vomiting, respiratory distress, cough or shortness of breath noted.  Does not seem to  have chest pain and abdominal pain.  Not sure if patient has symptoms of UTI.   Data reviewed independently and ED Course: pt was found to have WBC 18.5, positive urinalysis (cloudy appearance, negative leukocyte, few bacteria, WBC 11-20), CKD 2659, abnormal liver function (ALP 51, AST 154, ALT 111, total bilirubin 0.8), INR 1.2, PTT 29, positive UDS for cannabinoid, troponin 36 --> 42, lactic acid 4.8, AKI with creatinine 4.15, BUN 47, GFR 16 (recent baseline creatinine 1.22 on 07/06/2023), temperature 97.5, soft blood pressure 91/69, heart rate 105, RR 19, oxygen saturation 100% on room air. Negative x-ray for bilateral forearms, left femur, bilateral humerus, bilateral knee and bilateral tibia/fibula.  CT of C-spine negative for acute injury.  CT of head is negative for acute intracranial abnormalities, but showed chronic infarction.  Chest x-ray negative.  Patient is admitted to PCU as inpatient.  Dr. Tobi Bastos of GI is consulted.  CT of head and C spin: 1. No acute intracranial abnormality. 2. Interval extension of encephalomalacia in the bilateral PCA territories with new encephalomalacia in the posterior right frontal lobe, consistent with chronic infarct. 3. No acute cervical spine fracture or traumatic listhesis.    CT of the chest/abdomen/pelvis 1. No acute findings in the chest, abdomen, or pelvis. Specifically, no acute findings in the chest, abdomen, or pelvis 2. Tiny bilateral pulmonary nodules measuring up to 3 mm. No follow-up needed if patient is low-risk (and has  no known or suspected primary neoplasm). Non-contrast chest CT can be considered in 12 months if patient is high-risk. This recommendation follows the consensus statement: Guidelines for Management of Incidental Pulmonary Nodules Detected on CT Images: From the Fleischner Society 2017; Radiology 2017; 284:228-243. 3. Aortic Atherosclerosis (ICD10-I70.0) and Emphysema (ICD10-J43.9).    EKG: I have personally reviewed.   Sinus rhythm, QTc 480, low voltage, nonspecific T wave change   Review of Systems: Could not reviewed accurately due to altered mental status.  Allergy:  Allergies  Allergen Reactions   Penicillins Anaphylaxis    Has patient had a PCN reaction causing immediate rash, facial/tongue/throat swelling, SOB or lightheadedness with hypotension: Yes Has patient had a PCN reaction causing severe rash involving mucus membranes or skin necrosis: No Has patient had a PCN reaction that required hospitalization No Has patient had a PCN reaction occurring within the last 10 years: No If all of the above answers are "NO", then may proceed with Cephalosporin use.   Cephalosporins Other (See Comments)    Reaction:  Unknown     Past Medical History:  Diagnosis Date   Dysphagia    Hyperlipidemia    Hypertension    Stroke (HCC)    Ulcerative colitis (HCC)     Past Surgical History:  Procedure Laterality Date   COLONOSCOPY WITH PROPOFOL N/A 02/08/2016   Procedure: COLONOSCOPY WITH PROPOFOL;  Surgeon: Wallace Cullens, MD;  Location: [facility] ENDOSCOPY;  Service: Gastroenterology;  Laterality: N/A;   ESOPHAGOGASTRODUODENOSCOPY (EGD) WITH PROPOFOL N/A 02/08/2016   Procedure: ESOPHAGOGASTRODUODENOSCOPY (EGD) WITH PROPOFOL;  Surgeon: Wallace Cullens, MD;  Location: [facility] ENDOSCOPY;  Service: Gastroenterology;  Laterality: N/A;   NO PAST SURGERIES      Social History:  reports that he has never smoked. He has never used smokeless tobacco. He reports current alcohol use. He reports current drug use. Drug: Marijuana.  Family History:  Family History  Problem Relation Age of Onset   Breast cancer Mother    Diabetes Father    Hypertension Father    Breast cancer Sister      Prior to Admission medications   Medication Sig Start Date End Date Taking? Authorizing Provider  atorvastatin (LIPITOR) 20 MG tablet Take 20 mg by mouth at bedtime.     [provider]  balsalazide (COLAZAL) 750 MG capsule Take 750 mg by  mouth 3 (three) times daily.    [provider]  Cholecalciferol (VITAMIN D3) 5000 UNITS TABS Take 5,000 Units by mouth daily.    [provider]  clopidogrel (PLAVIX) 75 MG tablet Take 75 mg by mouth daily.    [provider]  metoprolol succinate (TOPROL-XL) 25 MG 24 hr tablet Take 12.5 mg by mouth every morning.     [provider]  traZODone (DESYREL) 50 MG tablet Take 50 mg by mouth at bedtime as needed for sleep.    [provider]    Physical Exam: Vitals:   08/04/23 1745 08/04/23 1745 08/04/23 1945 08/04/23 2003  BP: 111/71  96/74 100/81  Pulse: (!) 102  99 (!) 107  Resp: 17  18   Temp:  (!) 97.5 F (36.4 C) 97.8 F (36.6 C) 97.9 F (36.6 C)  TempSrc:  Rectal Oral Oral  SpO2:   100% 97%  Weight:      Height:       General: Not in acute distress HEENT:       Eyes: PERRL, EOMI, no jaundice  ENT: No discharge from the ears and nose       Neck: No JVD, no bruit, no mass felt. Heme: No neck lymph node enlargement. Cardiac: S1/S2, RRR, No murmurs, No gallops or rubs. Respiratory: No rales, wheezing, rhonchi or rubs. GI: Soft, nondistended, nontender, no organomegaly, BS present. GU: No hematuria Ext: No pitting leg edema bilaterally. 1+DP/PT pulse bilaterally. Musculoskeletal: No joint deformities, No joint redness or warmth, no limitation of ROM in spin. Skin: He has multiple skin bruises in his right cheek, bilateral knees, both forearms.  He has several small skin tears in left forearm. Neuro: Alert, confused, knows his own name, not orientated to time and place, cranial nerves II-XII grossly intact, has slurred speech, moves both arms normally, has symmetric leg weakness  Psych: Patient is not psychotic, no suicidal or hemocidal ideation.  Labs on Admission: I have personally reviewed following labs and imaging studies  CBC: Recent Labs  Lab 08/04/23 1538 08/04/23 1936  WBC 18.5* 14.6*  NEUTROABS 16.2*  --   HGB  14.3 12.4*  HCT 42.3 35.4*  MCV 84.3 82.7  PLT 361 267   Basic Metabolic Panel: Recent Labs  Lab 08/04/23 1538 08/04/23 1539  NA 135  --   K 4.1  --   CL 101  --   CO2 17*  --   GLUCOSE 117*  --   BUN 47*  --   CREATININE 4.15*  --   CALCIUM 7.7*  --   MG  --  2.6*   GFR: Estimated Creatinine Clearance: 19.5 mL/min (A) (by C-G formula based on SCr of 4.15 mg/dL (H)). Liver Function Tests: Recent Labs  Lab 08/04/23 1538  AST 154*  ALT 111*  ALKPHOS 51  BILITOT 0.8  PROT 5.4*  ALBUMIN 2.4*   No results for input(s): "LIPASE", "AMYLASE" in the last 168 hours. No results for input(s): "AMMONIA" in the last 168 hours. Coagulation Profile: Recent Labs  Lab 08/04/23 1538  INR 1.2   Cardiac Enzymes: Recent Labs  Lab 08/04/23 1538  CKTOTAL 2,659*   BNP (last 3 results) No results for input(s): "PROBNP" in the last 8760 hours. HbA1C: No results for input(s): "HGBA1C" in the last 72 hours. CBG: No results for input(s): "GLUCAP" in the last 168 hours. Lipid Profile: Recent Labs    08/04/23 1539  CHOL 88  HDL 39*  LDLCALC 28  TRIG 202  CHOLHDL 2.3   Thyroid Function Tests: No results for input(s): "TSH", "T4TOTAL", "FREET4", "T3FREE", "THYROIDAB" in the last 72 hours. Anemia Panel: No results for input(s): "VITAMINB12", "FOLATE", "FERRITIN", "TIBC", "IRON", "RETICCTPCT" in the last 72 hours. Urine analysis:    Component Value Date/Time   COLORURINE AMBER (A) 08/04/2023 1831   APPEARANCEUR CLOUDY (A) 08/04/2023 1831   APPEARANCEUR Turbid 08/20/2014 1544   LABSPEC 1.023 08/04/2023 1831   LABSPEC 1.027 08/20/2014 1544   PHURINE 5.0 08/04/2023 1831   GLUCOSEU NEGATIVE 08/04/2023 1831   GLUCOSEU 50 mg/dL 54/27/0623 7628   HGBUR LARGE (A) 08/04/2023 1831   BILIRUBINUR NEGATIVE 08/04/2023 1831   BILIRUBINUR Negative 08/20/2014 1544   KETONESUR 5 (A) 08/04/2023 1831   PROTEINUR 100 (A) 08/04/2023 1831   NITRITE NEGATIVE 08/04/2023 1831   LEUKOCYTESUR  NEGATIVE 08/04/2023 1831   LEUKOCYTESUR Negative 08/20/2014 1544   Sepsis Labs: @LABRCNTIP (procalcitonin:4,lacticidven:4) )No results found for this or any previous visit (from the past 240 hour(s)).   Radiological Exams on Admission: CT CHEST ABDOMEN PELVIS WO CONTRAST  Result Date: 08/04/2023 CLINICAL DATA:  Patient was found down, potentially for 3 days. EXAM: CT CHEST, ABDOMEN AND PELVIS WITHOUT CONTRAST TECHNIQUE: Multidetector CT imaging of the chest, abdomen and pelvis was performed following the standard protocol without IV contrast. RADIATION DOSE REDUCTION: This exam was performed according to the departmental dose-optimization program which includes automated exposure control, adjustment of the mA and/or kV according to patient size and/or use of iterative reconstruction technique. COMPARISON:  Abdomen and pelvis CT 11/22/2015 FINDINGS: CT CHEST FINDINGS Cardiovascular: The heart size is normal. No substantial pericardial effusion. Mild atherosclerotic calcification is noted in the wall of the thoracic aorta. Mediastinum/Nodes: No mediastinal lymphadenopathy. 9 mm posterior right thyroid nodule. Not clinically significant; no follow-up imaging recommended. No evidence for gross hilar lymphadenopathy although assessment is limited by the lack of intravenous contrast on the current study. The esophagus has normal imaging features. There is no axillary lymphadenopathy. (ref: J Am Coll Radiol. 2015 Feb;12(2): 143-50). Lungs/Pleura: Centrilobular and paraseptal emphysema evident. 3 mm posterior right lower lobe nodule identified on 146/4. 2 mm left upper lobe nodule identified on 60/4. No focal airspace consolidation. No pleural effusion. Musculoskeletal: No worrisome lytic or sclerotic osseous abnormality. CT ABDOMEN PELVIS FINDINGS Hepatobiliary: No suspicious focal abnormality in the liver on this study without intravenous contrast. Increased attenuation in the gallbladder lumen may reflect  sludge. No intrahepatic or extrahepatic biliary dilation. Pancreas: No focal mass lesion. No dilatation of the main duct. No intraparenchymal cyst. No peripancreatic edema. Spleen: No splenomegaly. No suspicious focal mass lesion. Adrenals/Urinary Tract: No adrenal nodule or mass. Right kidney unremarkable. 1 mm nonobstructing stone identified interpolar left kidney with another punctate stone in the lower pole left kidney. No evidence for hydroureter. The urinary bladder appears normal for the degree of distention. Stomach/Bowel: Stomach is unremarkable. No gastric wall thickening. No evidence of outlet obstruction. Duodenum is normally positioned as is the ligament of Treitz. No small bowel wall thickening. No small bowel dilatation. The terminal ileum is normal. The appendix is not well visualized, but there is no edema or inflammation in the region of the cecal tip to suggest appendicitis. No gross colonic mass. No colonic wall thickening. Vascular/Lymphatic: There is mild atherosclerotic calcification of the abdominal aorta without aneurysm. There is no gastrohepatic or hepatoduodenal ligament lymphadenopathy. No retroperitoneal or mesenteric lymphadenopathy. Left-sided IVC noted (normal variant). No pelvic sidewall lymphadenopathy. Reproductive: The prostate gland and seminal vesicles are unremarkable. Other: No intraperitoneal free fluid. Musculoskeletal: No worrisome lytic or sclerotic osseous abnormality. IMPRESSION: 1. No acute findings in the chest, abdomen, or pelvis. Specifically, no acute findings in the chest, abdomen, or pelvis 2. Tiny bilateral pulmonary nodules measuring up to 3 mm. No follow-up needed if patient is low-risk (and has no known or suspected primary neoplasm). Non-contrast chest CT can be considered in 12 months if patient is high-risk. This recommendation follows the consensus statement: Guidelines for Management of Incidental Pulmonary Nodules Detected on CT Images: From the  Fleischner Society 2017; Radiology 2017; 284:228-243. 3. Aortic Atherosclerosis (ICD10-I70.0) and Emphysema (ICD10-J43.9). Electronically Signed   By: Kennith Center M.D.   On: 08/04/2023 18:08   DG Knee 1-2 Views Left  Result Date: 08/04/2023 CLINICAL DATA:  Unwitnessed fall.  Left knee bruises. EXAM: LEFT KNEE - 1-2 VIEW COMPARISON:  None Available. FINDINGS: No evidence of fracture, dislocation, or joint effusion. No evidence of arthropathy or other focal bone abnormality. Soft tissues are unremarkable. IMPRESSION: Negative. Electronically Signed   By: Lupita Raider M.D.   On: 08/04/2023 17:41  CT Head Wo Contrast  Result Date: 08/04/2023 CLINICAL DATA:  Neck trauma, intoxicated or obtunded (Age >= 16y); Mental status change, unknown cause. EXAM: CT HEAD WITHOUT CONTRAST CT CERVICAL SPINE WITHOUT CONTRAST TECHNIQUE: Multidetector CT imaging of the head and cervical spine was performed following the standard protocol without intravenous contrast. Multiplanar CT image reconstructions of the cervical spine were also generated. RADIATION DOSE REDUCTION: This exam was performed according to the departmental dose-optimization program which includes automated exposure control, adjustment of the mA and/or kV according to patient size and/or use of iterative reconstruction technique. COMPARISON:  CT head and cervical spine 08/20/2014. FINDINGS: CT HEAD FINDINGS Brain: Interval extension of encephalomalacia in the bilateral PCA territories with new encephalomalacia in the posterior right frontal lobe, consistent with chronic infarct. Unchanged small area of old infarct in the left middle frontal gyrus. Old lacunar infarcts in the bilateral cerebellar hemispheres no acute hemorrhage, hydrocephalus, extra-axial collection, mass effect or midline shift. Vascular: No hyperdense vessel or unexpected calcification. Skull: No calvarial fracture or suspicious bone lesion. Skull base is unremarkable. Sinuses/Orbits: No  acute finding. Other: None. CT CERVICAL SPINE FINDINGS Alignment: Normal. Skull base and vertebrae: No acute fracture. Normal craniocervical junction. No suspicious bone lesions. Soft tissues and spinal canal: No prevertebral fluid or swelling. No visible canal hematoma. Disc levels: Mild cervical spondylosis without high-grade spinal canal stenosis. Upper chest: No acute findings. Other: None. IMPRESSION: 1. No acute intracranial abnormality. 2. Interval extension of encephalomalacia in the bilateral PCA territories with new encephalomalacia in the posterior right frontal lobe, consistent with chronic infarct. 3. No acute cervical spine fracture or traumatic listhesis. Electronically Signed   By: Orvan Falconer M.D.   On: 08/04/2023 17:40   CT Cervical Spine Wo Contrast  Result Date: 08/04/2023 CLINICAL DATA:  Neck trauma, intoxicated or obtunded (Age >= 16y); Mental status change, unknown cause. EXAM: CT HEAD WITHOUT CONTRAST CT CERVICAL SPINE WITHOUT CONTRAST TECHNIQUE: Multidetector CT imaging of the head and cervical spine was performed following the standard protocol without intravenous contrast. Multiplanar CT image reconstructions of the cervical spine were also generated. RADIATION DOSE REDUCTION: This exam was performed according to the departmental dose-optimization program which includes automated exposure control, adjustment of the mA and/or kV according to patient size and/or use of iterative reconstruction technique. COMPARISON:  CT head and cervical spine 08/20/2014. FINDINGS: CT HEAD FINDINGS Brain: Interval extension of encephalomalacia in the bilateral PCA territories with new encephalomalacia in the posterior right frontal lobe, consistent with chronic infarct. Unchanged small area of old infarct in the left middle frontal gyrus. Old lacunar infarcts in the bilateral cerebellar hemispheres no acute hemorrhage, hydrocephalus, extra-axial collection, mass effect or midline shift. Vascular: No  hyperdense vessel or unexpected calcification. Skull: No calvarial fracture or suspicious bone lesion. Skull base is unremarkable. Sinuses/Orbits: No acute finding. Other: None. CT CERVICAL SPINE FINDINGS Alignment: Normal. Skull base and vertebrae: No acute fracture. Normal craniocervical junction. No suspicious bone lesions. Soft tissues and spinal canal: No prevertebral fluid or swelling. No visible canal hematoma. Disc levels: Mild cervical spondylosis without high-grade spinal canal stenosis. Upper chest: No acute findings. Other: None. IMPRESSION: 1. No acute intracranial abnormality. 2. Interval extension of encephalomalacia in the bilateral PCA territories with new encephalomalacia in the posterior right frontal lobe, consistent with chronic infarct. 3. No acute cervical spine fracture or traumatic listhesis. Electronically Signed   By: Orvan Falconer M.D.   On: 08/04/2023 17:40   DG Forearm Right  Result Date: 08/04/2023 CLINICAL DATA:  Unwitnessed fall.  Bruising of right arm. EXAM: RIGHT FOREARM - 2 VIEW COMPARISON:  None Available. FINDINGS: There is no evidence of fracture or other focal bone lesions. Soft tissues are unremarkable. IMPRESSION: Negative. Electronically Signed   By: Lupita Raider M.D.   On: 08/04/2023 17:39   DG Humerus Left  Result Date: 08/04/2023 CLINICAL DATA:  Status post fall with multiple bruises to the upper and lower extremities EXAM: LEFT HUMERUS - 2 VIEW; LEFT FOREARM - 2 VIEW COMPARISON:  None Available. FINDINGS: There is no evidence of fracture or other focal bone lesions. Soft tissues are unremarkable. IMPRESSION: No acute fracture or dislocation. Electronically Signed   By: Agustin Cree M.D.   On: 08/04/2023 17:38   DG Forearm Left  Result Date: 08/04/2023 CLINICAL DATA:  Status post fall with multiple bruises to the upper and lower extremities EXAM: LEFT HUMERUS - 2 VIEW; LEFT FOREARM - 2 VIEW COMPARISON:  None Available. FINDINGS: There is no evidence of  fracture or other focal bone lesions. Soft tissues are unremarkable. IMPRESSION: No acute fracture or dislocation. Electronically Signed   By: Agustin Cree M.D.   On: 08/04/2023 17:38   DG Tibia/Fibula Left  Result Date: 08/04/2023 CLINICAL DATA:  Unwitnessed fall.  Bruising of left leg. EXAM: LEFT TIBIA AND FIBULA - 2 VIEW COMPARISON:  None Available. FINDINGS: There is no evidence of fracture or other focal bone lesions. Soft tissues are unremarkable. IMPRESSION: Negative. Electronically Signed   By: Lupita Raider M.D.   On: 08/04/2023 17:38   DG Tibia/Fibula Right  Result Date: 08/04/2023 CLINICAL DATA:  Multifocal bruises and pressure type wounds status post fall. GI bleed. EXAM: RIGHT KNEE - 1-2 VIEW; RIGHT TIBIA AND FIBULA - 2 VIEW COMPARISON:  None Available. FINDINGS: The mineralization and alignment are normal. There is no evidence of acute fracture or dislocation. The joint spaces appear preserved at the ankle and knee. No evidence of joint effusion, foreign body or soft tissue emphysema. IMPRESSION: No acute osseous findings or significant soft tissue abnormalities identified within the right knee or lower leg. Electronically Signed   By: Carey Bullocks M.D.   On: 08/04/2023 17:35   DG Knee 1-2 Views Right  Result Date: 08/04/2023 CLINICAL DATA:  Multifocal bruises and pressure type wounds status post fall. GI bleed. EXAM: RIGHT KNEE - 1-2 VIEW; RIGHT TIBIA AND FIBULA - 2 VIEW COMPARISON:  None Available. FINDINGS: The mineralization and alignment are normal. There is no evidence of acute fracture or dislocation. The joint spaces appear preserved at the ankle and knee. No evidence of joint effusion, foreign body or soft tissue emphysema. IMPRESSION: No acute osseous findings or significant soft tissue abnormalities identified within the right knee or lower leg. Electronically Signed   By: Carey Bullocks M.D.   On: 08/04/2023 17:35   DG Humerus Right  Result Date: 08/04/2023 CLINICAL DATA:   Larey Seat, bruising EXAM: RIGHT HUMERUS - 2+ VIEW COMPARISON:  12/17/2013 FINDINGS: Frontal and lateral views of the right humerus are obtained. No acute displaced fracture. Alignment of the right shoulder and elbow is anatomic. Mild acromioclavicular and glenohumeral joint osteoarthritis. Soft tissues are unremarkable. IMPRESSION: 1. 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 2. No acute fracture. Electronically Signed   By: Sharlet Salina M.D.   On: 08/04/2023 17:32   DG Chest Port 1 View  Result Date: 08/04/2023 CLINICAL DATA:  Short of breath, gastrointestinal bleeding EXAM: PORTABLE CHEST 1 VIEW COMPARISON:  08/20/2014 FINDINGS: 2 frontal  views of the chest demonstrate an unremarkable cardiac silhouette. No acute airspace disease, effusion, or pneumothorax. No acute bony abnormality. IMPRESSION: 1. No acute intrathoracic process. Electronically Signed   By: Sharlet Salina M.D.   On: 08/04/2023 16:36      Assessment/Plan Principal Problem:   Severe sepsis (HCC) Active Problems:   UTI (urinary tract infection)   Ulcerative colitis (HCC)   GI bleeding   Acute metabolic encephalopathy   Stroke (HCC)   Slurred speech   Fall at home, initial encounter   AKI (acute kidney injury) (HCC)   Rhabdomyolysis   Myocardial injury   HTN (hypertension)   Abnormal LFTs   HLD (hyperlipidemia)   Alcohol use   Pulmonary nodule   Assessment and Plan:  Severe sepsis: Patient has severe sepsis with WBC 18.5, tachycardia with heart rate 105, lactic acid 4.8, has altered mental status and AKI.  Urinalysis positive for UTI, but not sure if UTI is the only source of infection.  Patient has bloody stool and history of ulcerative colitis, cannot completely rule out ulcerative colitis flareup.  -Admitted to PCU as inpatient -Started Cipro and Flagyl empirically (patient received 1 dose of Levaquin in ED) -Follow-up of blood culture and urine culture -trend lactic acid level and check procalcitonin  level -IV fluid: Total of 1.75 L of LR, and 1 L normal saline, and then 125 cc/h of LR  UTI (urinary tract infection) -Follow-up urine culture -Antibiotics as above  Ulcerative colitis and GI bleeding: Hemoglobin 14.3.  His rectal bleeding is likely due to ulcerative colitis. -continue Colazal *** - hold plavix - check C diff  - Patient is on Cipro and Flagyl empirically as above - IVF: as above - Start IV pantoprazole 40 mg bid - Zofran IV for nausea - Avoid NSAIDs and SQ heparin - Maintain IV access (2 large bore IVs if possible). - Monitor closely and follow q6h cbc, transfuse as necessary, if Hgb<7.0 - LaB: INR, PTT and type screen - consulted Dr. Tobi Bastos of GI  Acute metabolic encephalopathy: Likely multifactorial etiology, patient has new slurred speech, wille need to rule out stroke.  CT head negative for acute intracranial abnormalities, but showed chronic infarction. -Frequent neurocheck -Fall precaution -Follow-up MRI for brain  History of stroke (HCC) -Continue Lipitor -Hold Plavix due to rectal bleeding  Slurred speech -Follow-up MRI for brain to rule out new stroke  Fall at home, initial encounter: Images are negative for acute injury. -PT/OT -Fall precaution -Wound care consult for skin tear -TOC consult  AKI (acute kidney injury) (HCC): Likely multifactorial etiology, including UTI, rhabdomyolysis, dehydration.  No hydronephrosis by CT scan -Treat underlying issues -IV fluid -Avoid using renal toxic medications  Rhabdomyolysis: CK level 2659 -IV fluids above -Repeat CK in the morning  Myocardial injury: Troponin 36 --> 42, likely demand ischemia. -Hold Plavix -Will not give aspirin due to rectal bleeding -Continue Lipitor -Check A1c, FLP  HTN (hypertension) -Hold metoprolol due to soft blood pressure and high risk of developing hypotension due to severe sepsis -IV hydralazine as needed  Abnormal LFTs: Likely due to sepsis and rhabdomyolysis.   Alcohol use may have contributed partially.  No biliary ductal dilation by CT scan -Check hepatitis panel -Avoid using Tylenol  HLD (hyperlipidemia) -Lipitor  Alcohol use -CIWA protocol -Neuroconsulted about importance of quitting alcohol use when patient mental status improves  Pulmonary nodule: incidental findings by CT scan -Follow-up with PCP    DVT ppx: SCD  Code Status: Full code  Family  Communication: Yes, patient's girlfriend by phone  Disposition Plan:  Anticipate discharge back to previous environment  Consults called:  Dr. Tobi Bastos of GI is consulted.  Admission status and Level of care: Progressive:     as inpt        Dispo: The patient is from: Home              Anticipated d/c is to: Home              Anticipated d/c date is: 2 days              Patient currently is not medically stable to d/c.    Severity of Illness:  The appropriate patient status for this patient is INPATIENT. Inpatient status is judged to be reasonable and necessary in order to provide the required intensity of service to ensure the patient's safety. The patient's presenting symptoms, physical exam findings, and initial radiographic and laboratory data in the context of their chronic comorbidities is felt to place them at high risk for further clinical deterioration. Furthermore, it is not anticipated that the patient will be medically stable for discharge from the hospital within 2 midnights of admission.   * I certify that at the point of admission it is my clinical judgment that the patient will require inpatient hospital care spanning beyond 2 midnights from the point of admission due to high intensity of service, high risk for further deterioration and high frequency of surveillance required.*       Date of Service 08/04/2023    Lorretta Harp [facility]   If 7PM-7AM, please contact night-coverage [email or web address] 08/04/2023, 8:54 PM

## 2023-08-04 NOTE — Consult Note (Signed)
CODE SEPSIS - PHARMACY COMMUNICATION  **Broad Spectrum Antibiotics should be administered within 1 hour of Sepsis diagnosis**  Time Code Sepsis Called/Page Received: 1645  Antibiotics Ordered: levofloxacin  Time of 1st antibiotic administration: 1659  Additional action taken by pharmacy: none  If necessary, Name of Provider/Nurse Contacted: n/a    Bettey Costa ,PharmD Clinical Pharmacist  08/04/2023  5:12 PM

## 2023-08-04 NOTE — ED Provider Notes (Addendum)
[facility] Provider Note    Event Date/Time   First MD Initiated Contact with Patient 08/04/23 1528     (approximate)   History   Altered Mental Status   HPI  [name] is a 62 y.o. male past medical history significant for ulcerative colitis, hypertension, prior CVA, hyperlipidemia, who presents to the emergency department after being found down.  Patient states that he had a fall approximately 3 days ago.  Patient's brother states that he had not heard from him for 3 days so he went over to check on him and found him on the ground.  Last known well was approximately 3 days ago.  Patient states that he has been down on the ground for 3 days and was unable to get up or ambulate.  Patient arrived covered in melanotic stool.      Physical Exam   Triage Vital Signs: ED Triage Vitals  Encounter Vitals Group     BP 08/04/23 1536 135/75     Systolic BP Percentile --      Diastolic BP Percentile --      Pulse Rate 08/04/23 1528 (!) 105     Resp 08/04/23 1528 19     Temp 08/04/23 1528 (!) 95.7 F (35.4 C)     Temp Source 08/04/23 1528 Rectal     SpO2 08/04/23 1528 99 %     Weight 08/04/23 1532 164 lb (74.4 kg)     Height 08/04/23 1532 5\' 10"  (1.778 m)     Head Circumference --      Peak Flow --      Pain Score --      Pain Loc --      Pain Education --      Exclude from Growth Chart --     Most recent vital signs: Vitals:   08/04/23 1745 08/04/23 1745  BP: 111/71   Pulse: (!) 102   Resp: 17   Temp:  (!) 97.5 F (36.4 C)  SpO2:      Physical Exam Constitutional:      Appearance: He is well-developed. He is ill-appearing and toxic-appearing.  HENT:     Head:     Comments: Abrasion to the right cheek Eyes:     Extraocular Movements: Extraocular movements intact.     Conjunctiva/sclera: Conjunctivae normal.     Pupils: Pupils are equal, round, and reactive to light.  Cardiovascular:     Rate and Rhythm: Regular rhythm. Tachycardia  present.  Pulmonary:     Effort: No respiratory distress.  Abdominal:     Tenderness: There is no abdominal tenderness.  Genitourinary:    Rectum: Guaiac result positive.  Musculoskeletal:     Cervical back: Normal range of motion.     Comments: Ecchymosis and bruises to bilateral upper and lower extremities.  Tenderness to palpation to bilateral shoulders, forearm, knee and lower legs.  No significant tenderness to palpation of the pelvis.  Skin:    General: Skin is warm.  Neurological:     Mental Status: He is alert. Mental status is at baseline.     IMPRESSION / MDM / ASSESSMENT AND PLAN / ED COURSE  I reviewed the triage vital signs and the nursing notes.  Differential diagnosis including intracranial hemorrhage, GI bleed, anemia, rhabdomyolysis, fracture, dehydration  On arrival hypothermic and tachycardic   EKG  I, Corena Herter, the attending physician, personally viewed and interpreted this ECG.   Rate: 100  Rhythm: Sinus  tachycardia  Axis: Normal  Intervals: Normal  ST&T Change: None  No tachycardic or bradycardic dysrhythmias while on cardiac telemetry.  RADIOLOGY I independently reviewed imaging, my interpretation of imaging: Chest x-ray no signs of pneumonia  CT head, C-spine and chest abdomen and pelvis obtained without contrast given acute kidney injury.  No acute traumatic injury.  LABS (all labs ordered are listed, but only abnormal results are displayed) Labs interpreted as -    Labs Reviewed  COMPREHENSIVE METABOLIC PANEL - Abnormal; Notable for the following components:      Result Value   CO2 17 (*)    Glucose, Bld 117 (*)    BUN 47 (*)    Creatinine, Ser 4.15 (*)    Calcium 7.7 (*)    Total Protein 5.4 (*)    Albumin 2.4 (*)    AST 154 (*)    ALT 111 (*)    GFR, Estimated 16 (*)    Anion gap 17 (*)    All other components within normal limits  LACTIC ACID, PLASMA - Abnormal; Notable for the following components:   Lactic Acid, Venous  4.8 (*)    All other components within normal limits  LACTIC ACID, PLASMA - Abnormal; Notable for the following components:   Lactic Acid, Venous 2.4 (*)    All other components within normal limits  CBC WITH DIFFERENTIAL/PLATELET - Abnormal; Notable for the following components:   WBC 18.5 (*)    RDW 15.7 (*)    Neutro Abs 16.2 (*)    Abs Immature Granulocytes 0.52 (*)    All other components within normal limits  PROTIME-INR - Abnormal; Notable for the following components:   Prothrombin Time 15.8 (*)    All other components within normal limits  URINALYSIS, W/ REFLEX TO CULTURE (INFECTION SUSPECTED) - Abnormal; Notable for the following components:   Color, Urine AMBER (*)    APPearance CLOUDY (*)    Hgb urine dipstick LARGE (*)    Ketones, ur 5 (*)    Protein, ur 100 (*)    Bacteria, UA FEW (*)    All other components within normal limits  CK - Abnormal; Notable for the following components:   Total CK 2,659 (*)    All other components within normal limits  MAGNESIUM - Abnormal; Notable for the following components:   Magnesium 2.6 (*)    All other components within normal limits  LIPID PANEL - Abnormal; Notable for the following components:   HDL 39 (*)    All other components within normal limits  URINE DRUG SCREEN, QUALITATIVE ([facility] ONLY) - Abnormal; Notable for the following components:   Cannabinoid 50 Ng, Ur [place] POSITIVE (*)    All other components within normal limits  TROPONIN I (HIGH SENSITIVITY) - Abnormal; Notable for the following components:   Troponin I (High Sensitivity) 36 (*)    All other components within normal limits  CULTURE, BLOOD (ROUTINE X 2)  CULTURE, BLOOD (ROUTINE X 2)  C DIFFICILE QUICK SCREEN W PCR REFLEX    URINE CULTURE  APTT  HEMOGLOBIN A1C  CBC  CBC  CBC  CK  HEPATITIS PANEL, ACUTE  HIV ANTIBODY (ROUTINE TESTING W REFLEX)  COMPREHENSIVE METABOLIC PANEL  URINALYSIS, W/ REFLEX TO CULTURE (INFECTION SUSPECTED)  TYPE AND SCREEN   TROPONIN I (HIGH SENSITIVITY)     MDM  Initially felt 30 cc/kg of IV fluids may be detrimental to the patient given his GI bleed, will give 1 L of IV fluids  and reevaluate.  Clinical Course as of 08/04/23 1927  Mon Aug 04, 2023  1647 Lactic acid significantly elevated at 4.8.  Patient does meet criteria for sepsis given his hypothermia, tachycardia, leukocytosis and elevated lactic acid.  Unknown source.  Multiple allergies listed in the chart of penicillin and cephalosporin, after discussion with pharmacy will start on IV Levaquin.  No risk factors for MRSA.  Patient given 30 cc/kg of IV fluids. [SM]    Clinical Course User Index [SM] Corena Herter, MD   Clinical picture concerning for rhabdomyolysis.  Significant elevation of his creatinine with a significant acute kidney injury.  Switching CT scans to noncontrasted CT scans.  Given IV fluid bolus.  No obvious source of an infectious process, lab work could be secondary to dehydration and rhabdomyolysis with severe acute kidney injury.  Consulted hospitalist for admission.  Continued to have good perfusion on reevaluation.  PROCEDURES:  Critical Care performed: Yes  .Critical Care  Performed by: Corena Herter, MD Authorized by: Corena Herter, MD   Critical care provider statement:    Critical care time (minutes):  45   Critical care time was exclusive of:  Separately billable procedures and treating other patients   Critical care was necessary to treat or prevent imminent or life-threatening deterioration of the following conditions:  Sepsis   Critical care was time spent personally by me on the following activities:  Development of treatment plan with patient or surrogate, discussions with consultants, evaluation of patient's response to treatment, examination of patient, ordering and review of laboratory studies, ordering and review of radiographic studies, ordering and performing treatments and interventions, pulse oximetry,  re-evaluation of patient's condition and review of old charts   Patient's presentation is most consistent with acute presentation with potential threat to life or bodily function.   MEDICATIONS ORDERED IN ED: Medications  ondansetron (ZOFRAN) injection 4 mg (has no administration in time range)  hydrALAZINE (APRESOLINE) injection 5 mg (has no administration in time range)  lactated ringers infusion ( Intravenous New Bag/Given 08/04/23 1847)  pantoprazole (PROTONIX) injection 40 mg (has no administration in time range)  metroNIDAZOLE (FLAGYL) IVPB 500 mg (has no administration in time range)  ciprofloxacin (CIPRO) IVPB 400 mg (has no administration in time range)  sodium chloride 0.9 % bolus 1,000 mL (0 mLs Intravenous Stopped 08/04/23 1830)  lactated ringers bolus 1,000 mL (0 mLs Intravenous Stopped 08/04/23 1912)    And  lactated ringers bolus 250 mL (0 mLs Intravenous Stopped 08/04/23 1845)  levofloxacin (LEVAQUIN) IVPB 750 mg (0 mg Intravenous Stopped 08/04/23 1830)    FINAL CLINICAL IMPRESSION(S) / ED DIAGNOSES   Final diagnoses:  Altered mental status, unspecified altered mental status type  Fall, initial encounter  Injury of head, initial encounter  Gastrointestinal hemorrhage, unspecified gastrointestinal hemorrhage type  Traumatic rhabdomyolysis, initial encounter [facility])     Rx / DC Orders   ED Discharge Orders     None        Note:  This document was prepared using Dragon voice recognition software and may include unintentional dictation errors.   Corena Herter, MD 08/04/23 1655    Corena Herter, MD 08/04/23 Kristopher Oppenheim

## 2023-08-04 NOTE — Progress Notes (Signed)
Pharmacy Antibiotic Note  [name] is a 62 y.o. male w/ PMH of ulcerative colitis, hypertension, prior CVA, hyperlipidemia admitted on 08/04/2023 with SIRS.  Pharmacy has been consulted for ciprofloxacin dosing.  Plan: start ciprofloxacin 400 mg IV every 24 hours ---follow renal function for needed dose adjustments  Height: 5\' 10"  (177.8 cm) Weight: 74.4 kg (164 lb) IBW/kg (Calculated) : 73  Temp (24hrs), Avg:96.6 F (35.9 C), Min:95.7 F (35.4 C), Max:97.5 F (36.4 C)  Recent Labs  Lab 08/04/23 1538 08/04/23 1539  WBC 18.5*  --   CREATININE 4.15*  --   LATICACIDVEN  --  4.8*    Estimated Creatinine Clearance: 19.5 mL/min (A) (by C-G formula based on SCr of 4.15 mg/dL (H)).    Allergies  Allergen Reactions   Penicillins Anaphylaxis    Has patient had a PCN reaction causing immediate rash, facial/tongue/throat swelling, SOB or lightheadedness with hypotension: Yes Has patient had a PCN reaction causing severe rash involving mucus membranes or skin necrosis: No Has patient had a PCN reaction that required hospitalization No Has patient had a PCN reaction occurring within the last 10 years: No If all of the above answers are "NO", then may proceed with Cephalosporin use.   Cephalosporins Other (See Comments)    Reaction:  Unknown     Antimicrobials this admission: 08/26 levofloxacin x 1  08/27 ciprofloxacin >>   Microbiology results: 08/26 BCx: pending  Thank you for allowing pharmacy to be a part of this patient's care.  Lowella Bandy 08/04/2023 7:01 PM

## 2023-08-04 NOTE — ED Triage Notes (Addendum)
Pt arrives from home via EMS. Pt has not been heard from/seen x3 days, fire reports pt is "normally walky talkie". Pt is responsive to name at this time, is having GI bleeding according to EMS. Pt has hx GI bleed.   Pt given 1200 NS  112/64 164 cbg  Pt states he fell and was too weak to get up, denies hitting head, states does take thinners

## 2023-08-04 NOTE — ED Notes (Signed)
Pt with large amount bloody diarrhea. Pt cleaned, new brief on pt. WCTM.

## 2023-08-04 NOTE — ED Notes (Signed)
Pt brought in covered in frankly bloody stool. Multiple wounds noted to pt. Pt cleaned at this time, positive hemocult collected.

## 2023-08-05 DIAGNOSIS — K529 Noninfective gastroenteritis and colitis, unspecified: Secondary | ICD-10-CM | POA: Diagnosis not present

## 2023-08-05 DIAGNOSIS — Z8719 Personal history of other diseases of the digestive system: Secondary | ICD-10-CM

## 2023-08-05 DIAGNOSIS — A419 Sepsis, unspecified organism: Secondary | ICD-10-CM

## 2023-08-05 DIAGNOSIS — K625 Hemorrhage of anus and rectum: Secondary | ICD-10-CM

## 2023-08-05 DIAGNOSIS — R6520 Severe sepsis without septic shock: Secondary | ICD-10-CM

## 2023-08-05 DIAGNOSIS — R7401 Elevation of levels of liver transaminase levels: Secondary | ICD-10-CM

## 2023-08-05 LAB — GASTROINTESTINAL PANEL BY PCR, STOOL (REPLACES STOOL CULTURE)

## 2023-08-05 LAB — BLOOD CULTURE ID PANEL (REFLEXED) - BCID2

## 2023-08-05 LAB — COMPREHENSIVE METABOLIC PANEL
ALT: 95 U/L — ABNORMAL HIGH (ref 0–44)
AST: 126 U/L — ABNORMAL HIGH (ref 15–41)
Albumin: 1.8 g/dL — ABNORMAL LOW (ref 3.5–5.0)
Alkaline Phosphatase: 40 U/L (ref 38–126)
Anion gap: 5 (ref 5–15)
BUN: 47 mg/dL — ABNORMAL HIGH (ref 6–20)
CO2: 21 mmol/L — ABNORMAL LOW (ref 22–32)
Calcium: 7.5 mg/dL — ABNORMAL LOW (ref 8.9–10.3)
Chloride: 103 mmol/L (ref 98–111)
Creatinine, Ser: 3.12 mg/dL — ABNORMAL HIGH (ref 0.61–1.24)
GFR, Estimated: 22 mL/min — ABNORMAL LOW (ref >60)
Glucose, Bld: 97 mg/dL (ref 70–99)
Potassium: 3.9 mmol/L (ref 3.5–5.1)
Sodium: 129 mmol/L — ABNORMAL LOW (ref 135–145)
Total Bilirubin: 0.7 mg/dL (ref 0.3–1.2)
Total Protein: 4.3 g/dL — ABNORMAL LOW (ref 6.5–8.1)

## 2023-08-05 LAB — C DIFFICILE QUICK SCREEN W PCR REFLEX
C Diff antigen: NEGATIVE
C Diff interpretation: NOT DETECTED
C Diff toxin: NEGATIVE

## 2023-08-05 LAB — HEPATITIS PANEL, ACUTE
HCV Ab: NONREACTIVE
Hep A IgM: NONREACTIVE
Hep B C IgM: NONREACTIVE
Hepatitis B Surface Ag: NONREACTIVE

## 2023-08-05 LAB — CBC
HCT: 36 % — ABNORMAL LOW (ref 39.0–52.0)
HCT: 33.6 % — ABNORMAL LOW (ref 39.0–52.0)
Hemoglobin: 12.5 g/dL — ABNORMAL LOW (ref 13.0–17.0)
Hemoglobin: 11.8 g/dL — ABNORMAL LOW (ref 13.0–17.0)
MCH: 28.9 pg (ref 26.0–34.0)
MCH: 29 pg (ref 26.0–34.0)
MCHC: 34.7 g/dL (ref 30.0–36.0)
MCHC: 35.1 g/dL (ref 30.0–36.0)
MCV: 83.3 fL (ref 80.0–100.0)
MCV: 82.6 fL (ref 80.0–100.0)
Platelets: 257 10*3/uL (ref 150–400)
Platelets: 221 10*3/uL (ref 150–400)
RBC: 4.32 MIL/uL (ref 4.22–5.81)
RBC: 4.07 MIL/uL — ABNORMAL LOW (ref 4.22–5.81)
RDW: 15.4 % (ref 11.5–15.5)
RDW: 15.2 % (ref 11.5–15.5)
WBC: 13.9 10*3/uL — ABNORMAL HIGH (ref 4.0–10.5)
WBC: 11.3 10*3/uL — ABNORMAL HIGH (ref 4.0–10.5)
nRBC: 0 % (ref 0.0–0.2)
nRBC: 0 % (ref 0.0–0.2)

## 2023-08-05 LAB — TROPONIN I (HIGH SENSITIVITY): Troponin I (High Sensitivity): 46 ng/L — ABNORMAL HIGH (ref <18)

## 2023-08-05 LAB — LACTIC ACID, PLASMA
Lactic Acid, Venous: 1.7 mmol/L (ref 0.5–1.9)
Lactic Acid, Venous: 1.2 mmol/L (ref 0.5–1.9)

## 2023-08-05 LAB — HEMOGLOBIN A1C
Hgb A1c MFr Bld: 4.9 % (ref 4.8–5.6)
Mean Plasma Glucose: 93.93 mg/dL

## 2023-08-05 LAB — HIV ANTIBODY (ROUTINE TESTING W REFLEX): HIV Screen 4th Generation wRfx: NONREACTIVE

## 2023-08-05 LAB — GLUCOSE, CAPILLARY: Glucose-Capillary: 109 mg/dL — ABNORMAL HIGH (ref 70–99)

## 2023-08-05 LAB — CK: Total CK: 1450 U/L — ABNORMAL HIGH (ref 49–397)

## 2023-08-05 MED ORDER — TRAZODONE HCL 50 MG PO TABS
50.0000 mg | ORAL_TABLET | Freq: Every evening | ORAL | Status: DC | PRN
  Administered 2023-08-08: 50 mg via ORAL
  Filled 2023-08-05: qty 1

## 2023-08-05 MED ORDER — ATORVASTATIN CALCIUM 20 MG PO TABS
20.0000 mg | ORAL_TABLET | Freq: Every day | ORAL | Status: DC
  Administered 2023-08-05: 20 mg via ORAL
  Filled 2023-08-05: qty 1

## 2023-08-05 MED ORDER — VITAMIN D 25 MCG (1000 UNIT) PO TABS
5000.0000 [IU] | ORAL_TABLET | Freq: Every day | ORAL | Status: DC
  Administered 2023-08-05 – 2023-08-09 (×5): 5000 [IU] via ORAL
  Filled 2023-08-05 (×5): qty 5

## 2023-08-05 MED ORDER — MUPIROCIN 2 % EX OINT
TOPICAL_OINTMENT | Freq: Every day | CUTANEOUS | Status: DC
  Filled 2023-08-05: qty 22

## 2023-08-05 MED ORDER — MESALAMINE 400 MG PO CPDR
800.0000 mg | DELAYED_RELEASE_CAPSULE | Freq: Three times a day (TID) | ORAL | Status: DC
  Administered 2023-08-05 – 2023-08-09 (×12): 800 mg via ORAL
  Filled 2023-08-05 (×14): qty 2

## 2023-08-05 NOTE — Plan of Care (Signed)
  Problem: Education: Goal: Knowledge of General Education information will improve Description: Including pain rating scale, medication(s)/side effects and non-pharmacologic comfort measures Outcome: Progressing   Problem: Clinical Measurements: Goal: Respiratory complications will improve Outcome: Progressing   Problem: Nutrition: Goal: Adequate nutrition will be maintained Outcome: Progressing   Problem: Health Behavior/Discharge Planning: Goal: Ability to manage health-related needs will improve Outcome: Not Progressing   Problem: Activity: Goal: Risk for activity intolerance will decrease Outcome: Not Progressing   Problem: Coping: Goal: Level of anxiety will decrease Outcome: Not Progressing   Problem: Elimination: Goal: Will not experience complications related to bowel motility Outcome: Not Progressing   Problem: Skin Integrity: Goal: Risk for impaired skin integrity will decrease Outcome: Not Progressing

## 2023-08-05 NOTE — Progress Notes (Signed)
   08/05/23 1600  Spiritual Encounters  Type of Visit Initial  Care provided to: Pt and family  Referral source Patient request  Reason for visit Advance directives  OnCall Visit No   Chaplain discussed Advanced Directive information with patient and girlfriend. Unable to complete paperwork today, patient will notify nurse when ready for completion. Chaplain spiritual support services remain available as needed.

## 2023-08-05 NOTE — Progress Notes (Signed)
PHARMACY - PHYSICIAN COMMUNICATION CRITICAL VALUE ALERT - BLOOD CULTURE IDENTIFICATION (BCID)  [name] is an 62 y.o. male who presented to [facility] on 08/04/2023 with a chief complaint of confusion, fall, and possible GI bleed.   Assessment:  Blood cultures from 8/26 with [facility] In 1 of 4 bottles, BCID detects S. Epidermidis (methicillin-susceptible).   Name of physician (or Provider) Contacted: Dr Georgeann Oppenheim  Current antibiotics: ciprofloxacin and metronidazole for possible UTI and UC flare  Changes to prescribed antibiotics recommended:  Monitor on current antibiotic therapy   Results for orders placed or performed during the hospital encounter of 08/04/23  Blood Culture ID Panel (Reflexed) (Collected: 08/04/2023  3:40 PM)  Result Value Ref Range   Enterococcus faecalis NOT DETECTED NOT DETECTED   Enterococcus Faecium NOT DETECTED NOT DETECTED   Listeria monocytogenes NOT DETECTED NOT DETECTED   Staphylococcus species DETECTED (A) NOT DETECTED   Staphylococcus aureus (BCID) NOT DETECTED NOT DETECTED   Staphylococcus epidermidis DETECTED (A) NOT DETECTED   Staphylococcus lugdunensis NOT DETECTED NOT DETECTED   Streptococcus species NOT DETECTED NOT DETECTED   Streptococcus agalactiae NOT DETECTED NOT DETECTED   Streptococcus pneumoniae NOT DETECTED NOT DETECTED   Streptococcus pyogenes NOT DETECTED NOT DETECTED   A.calcoaceticus-baumannii NOT DETECTED NOT DETECTED   Bacteroides fragilis NOT DETECTED NOT DETECTED   Enterobacterales NOT DETECTED NOT DETECTED   Enterobacter cloacae complex NOT DETECTED NOT DETECTED   Escherichia coli NOT DETECTED NOT DETECTED   Klebsiella aerogenes NOT DETECTED NOT DETECTED   Klebsiella oxytoca NOT DETECTED NOT DETECTED   Klebsiella pneumoniae NOT DETECTED NOT DETECTED   Proteus species NOT DETECTED NOT DETECTED   Salmonella species NOT DETECTED NOT DETECTED   Serratia marcescens NOT DETECTED NOT DETECTED   Haemophilus influenzae NOT  DETECTED NOT DETECTED   Neisseria meningitidis NOT DETECTED NOT DETECTED   Pseudomonas aeruginosa NOT DETECTED NOT DETECTED   Stenotrophomonas maltophilia NOT DETECTED NOT DETECTED   Candida albicans NOT DETECTED NOT DETECTED   Candida auris NOT DETECTED NOT DETECTED   Candida glabrata NOT DETECTED NOT DETECTED   Candida krusei NOT DETECTED NOT DETECTED   Candida parapsilosis NOT DETECTED NOT DETECTED   Candida tropicalis NOT DETECTED NOT DETECTED   Cryptococcus neoformans/gattii NOT DETECTED NOT DETECTED   Methicillin resistance mecA/C NOT DETECTED NOT DETECTED    Juliette Alcide, PharmD, BCPS, BCIDP Work Cell: [phone] 08/05/2023 3:36 PM

## 2023-08-05 NOTE — Evaluation (Signed)
Clinical/Bedside Swallow Evaluation Patient Details  Name: [name] MRN: [id number] Date of Birth: 01/31/1961  Today's Date: 08/05/2023 Time: SLP Start Time (ACUTE ONLY): 1430 SLP Stop Time (ACUTE ONLY): 1530 SLP Time Calculation (min) (ACUTE ONLY): 60 min  Past Medical History:  Past Medical History:  Diagnosis Date   Dysphagia    Hyperlipidemia    Hypertension    Stroke (HCC)    Ulcerative colitis (HCC)    Past Surgical History:  Past Surgical History:  Procedure Laterality Date   COLONOSCOPY WITH PROPOFOL N/A 02/08/2016   Procedure: COLONOSCOPY WITH PROPOFOL;  Surgeon: Wallace Cullens, MD;  Location: [facility] ENDOSCOPY;  Service: Gastroenterology;  Laterality: N/A;   ESOPHAGOGASTRODUODENOSCOPY (EGD) WITH PROPOFOL N/A 02/08/2016   Procedure: ESOPHAGOGASTRODUODENOSCOPY (EGD) WITH PROPOFOL;  Surgeon: Wallace Cullens, MD;  Location: [facility] ENDOSCOPY;  Service: Gastroenterology;  Laterality: N/A;   NO PAST SURGERIES     HPI:  per admitting H & P "[name] is a 62 y.o. male with medical history significant of stroke with chronic right hand numbness, ulcerative colitis, GIB, HTN, HLD, alcohol use, marijuana abuse, who presents with altered mental status, fall and GI bleeding.     Patient has AMS, and is unable to provide accurate medical history, therefore, most of the history is obtained by discussing the case with ED physician, per EMS report, and with the nursing staff. I also called his girlfriend by phone who provided some medical history.     Per her girlfriend, patient has history of stroke with chronic right hand numbness.  He had slurred speech when he had stroke in the past which has resolved.  Girlfriend states that patient has intermittent rectal bleeding with dark stool for more than a month.  At her normal baseline, patient is alert and orientated x 3.  Girlfriend states that patient drinks beer and liquor almost every day.     Per EDP, pt had fall 3 days ago and his brother had not  heard from pt for 3 days so he went over to check on him, and found pt on the ground. Last known well was approximately 3 days ago.  Patient has generalized weakness, is unable to get up or ambulate. Pt was covered in bloody stool. He has multiple skin bruises in his right cheek, bilateral knees, both forearms.  He has several small skin tear in left forearm.     When I saw pt in ED, patient is alert, but confused.  He knows his own name, not orientated to the time and place.  He has symmetrical weakness in both legs, moves his arms normally.  He has slurred speech which is new.  No facial droop noted.  No active nausea, vomiting, respiratory distress, cough or shortness of breath noted.  Does not seem to have chest pain and abdominal pain."    Assessment / Plan / Recommendation   Clinical Impression   Pt presents with mild to moderate dysphagia at bedside. Oral mech exam revealed structures to be functioning adequately. Pt tolerated pureed boluses and solids without overt s/s of aspiration but needed extended time to chew secondary to poor dentition. Given sips of thin liquid by cup and by straw Pt was impulsive and needed cues to slow down and take small sips. Pt presented with a weak cough or throat clear after most sips of thin liquid. Pt tolerated nectar thick liquids well. Occasional wet vocal quality which Pt was able to clear with cues to cough. Pt  is currently on a clear liquid diet, rec changing to Dys 2 with nectar thick liquids after MD approval. ST to follow up with toleration of diet and consider MBSS if Pt continues to demonstrate s/s of aspiration at bedside. SLP Visit Diagnosis: Dysphagia, oropharyngeal phase (R13.12)    Aspiration Risk  Mild aspiration risk;Moderate aspiration risk    Diet Recommendation Dysphagia 2 (Fine chop);Nectar-thick liquid    Liquid Administration via: Cup Medication Administration: Whole meds with puree Supervision: Staff to assist with self  feeding Compensations: Minimize environmental distractions;Slow rate;Small sips/bites Postural Changes: Seated upright at 90 degrees;Remain upright for at least 30 minutes after po intake    Other  Recommendations Oral Care Recommendations: Oral care BID    Recommendations for follow up therapy are one component of a multi-disciplinary discharge planning process, led by the attending physician.  Recommendations may be updated based on patient status, additional functional criteria and insurance authorization.  Follow up Recommendations Follow physician's recommendations for discharge plan and follow up therapies      Assistance Recommended at Discharge  Assist with meals  Functional Status Assessment Patient has had a recent decline in their functional status and demonstrates the ability to make significant improvements in function in a reasonable and predictable amount of time.  Frequency and Duration min 2x/week  1 week       Prognosis Prognosis for improved oropharyngeal function: Guarded Barriers to Reach Goals: Behavior      Swallow Study   General Date of Onset: 08/04/23 HPI: per admitting H & P "[name] is a 62 y.o. male with medical history significant of stroke with chronic right hand numbness, ulcerative colitis, GIB, HTN, HLD, alcohol use, marijuana abuse, who presents with altered mental status, fall and GI bleeding.     Patient has AMS, and is unable to provide accurate medical history, therefore, most of the history is obtained by discussing the case with ED physician, per EMS report, and with the nursing staff. I also called his girlfriend by phone who provided some medical history.     Per her girlfriend, patient has history of stroke with chronic right hand numbness.  He had slurred speech when he had stroke in the past which has resolved.  Girlfriend states that patient has intermittent rectal bleeding with dark stool for more than a month.  At her normal  baseline, patient is alert and orientated x 3.  Girlfriend states that patient drinks beer and liquor almost every day.     Per EDP, pt had fall 3 days ago and his brother had not heard from pt for 3 days so he went over to check on him, and found pt on the ground. Last known well was approximately 3 days ago.  Patient has generalized weakness, is unable to get up or ambulate. Pt was covered in bloody stool. He has multiple skin bruises in his right cheek, bilateral knees, both forearms.  He has several small skin tear in left forearm.     When I saw pt in ED, patient is alert, but confused.  He knows his own name, not orientated to the time and place.  He has symmetrical weakness in both legs, moves his arms normally.  He has slurred speech which is new.  No facial droop noted.  No active nausea, vomiting, respiratory distress, cough or shortness of breath noted.  Does not seem to have chest pain and abdominal pain." Type of Study: Bedside Swallow Evaluation Diet Prior to this  Study: Clear liquid diet Behavior/Cognition: Alert;Cooperative;Pleasant mood;Requires cueing;Impulsive Oral Cavity - Dentition: Poor condition Vision: Functional for self-feeding Self-Feeding Abilities: Total assist Patient Positioning: Upright in bed Baseline Vocal Quality: Normal    Oral/Motor/Sensory Function Overall Oral Motor/Sensory Function: Within functional limits   Ice Chips Ice chips: Not tested   Thin Liquid Thin Liquid: Impaired Presentation: Cup;Straw Pharyngeal  Phase Impairments: Cough - Immediate;Cough - Delayed;Throat Clearing - Delayed;Throat Clearing - Immediate    Nectar Thick Nectar Thick Liquid: Within functional limits Presentation: Cup   Honey Thick     Puree Puree: Within functional limits Presentation: Spoon   Solid     Solid: Impaired Presentation: Self Fed Oral Phase Impairments: Impaired mastication Oral Phase Functional Implications: Prolonged oral transit;Impaired mastication       Eather Colas 08/05/2023,4:44 PM

## 2023-08-05 NOTE — Evaluation (Signed)
Physical Therapy Evaluation Patient Details Name: [name] MRN: [id number] DOB: 22-Dec-1960 Today's Date: 08/05/2023  History of Present Illness  Patient is a 62 y.o. male with medical history significant of stroke with chronic right hand numbness, ulcerative colitis, GIB, HTN, HLD, alcohol use, marijuana abuse, who presents with altered mental status, fall and GI bleeding. Current MD assessment includes: Severe sepsis, UTI, Ulcerative colitis and GI bleeding, slurred speech, post fall at home, and Rhabdomyolysis.  Clinical Impression  Pt was pleasant and motivated to participate during the session and put forth good effort throughout. Pt able to perform rolling in bed x3 to each side with Mod A. Pt able to perform various bed exercises with focus on controlled motions and sustained end range holds; pt given education on importance and frequency of bed exercises and mobility, pt verbalized understanding. Pt required  Mod A for supine to sit, pt able to maintain seated balance on in upright position. He is able to perform STS with RW, with Mod A and VC. Once standing pt able to perform 5-6 mini squats and shuffle step towards HOB, VC and TC provided for steps and AD management. Pt will benefit from continued PT services upon discharge to safely address deficits listed in patient problem list for decreased caregiver assistance and eventual return to PLOF.          If plan is discharge home, recommend the following: A little help with walking and/or transfers;A little help with bathing/dressing/bathroom;Assistance with cooking/housework;Assist for transportation;Help with stairs or ramp for entrance   Can travel by private vehicle   No    Equipment Recommendations Other (comment) (TBD at next venue of care)  Recommendations for Other Services       Functional Status Assessment Patient has had a recent decline in their functional status and demonstrates the ability to make significant  improvements in function in a reasonable and predictable amount of time.     Precautions / Restrictions Precautions Precautions: Fall Restrictions Weight Bearing Restrictions: No      Mobility  Bed Mobility Overal bed mobility: Needs Assistance Bed Mobility: Rolling, Supine to Sit Rolling: Mod assist   Supine to sit: Mod assist     General bed mobility comments: Pt requiring mod A to roll to side but able to maintain sidelying position.    Transfers Overall transfer level: Needs assistance Equipment used: Rolling walker (2 wheels) Transfers: Sit to/from Stand Sit to Stand: Mod assist           General transfer comment: Pt able to perform STS to RW with VC for hand placement and sequencing.    Ambulation/Gait Ambulation/Gait assistance: Min assist Gait Distance (Feet): 2 Feet Assistive device: Rolling walker (2 wheels) Gait Pattern/deviations: Shuffle, Step-to pattern, Decreased step length - right, Decreased step length - left, Decreased stride length, Trunk flexed Gait velocity: decreased     General Gait Details: Pt able to take 4-5 shuffling side steps towards EOB  Stairs            Wheelchair Mobility     Tilt Bed    Modified Rankin (Stroke Patients Only)       Balance Overall balance assessment: Needs assistance Sitting-balance support: Feet supported, Bilateral upper extremity supported Sitting balance-Leahy Scale: Fair     Standing balance support: Reliant on assistive device for balance, During functional activity, Bilateral upper extremity supported Standing balance-Leahy Scale: Poor Standing balance comment: Static standing with RW  Pertinent Vitals/Pain Pain Assessment Pain Assessment: No/denies pain    Home Living Family/patient expects to be discharged to:: Private residence Living Arrangements: Alone Available Help at Discharge: Family;Friend(s);Available PRN/intermittently Type of  Home: House Home Access: Stairs to enter Entrance Stairs-Rails: Lawyer of Steps: 2   Home Layout: One level Home Equipment: Cane - quad;Standard Environmental consultant      Prior Function Prior Level of Function : Independent/Modified Independent;History of Falls (last six months)             Mobility Comments: limited community ambulator, does not drive, does not use AD. Per pt report; he had one fall that caused admission to hospital; but denies any additional falls. ADLs Comments: able to shower and dress Ind     Extremity/Trunk Assessment   Upper Extremity Assessment Upper Extremity Assessment: Generalized weakness    Lower Extremity Assessment Lower Extremity Assessment: Generalized weakness       Communication   Communication Communication: Difficulty communicating thoughts/reduced clarity of speech  Cognition Arousal: Alert Behavior During Therapy: WFL for tasks assessed/performed Overall Cognitive Status: No family/caregiver present to determine baseline cognitive functioning                                          General Comments      Exercises Total Joint Exercises Ankle Circles/Pumps: AROM, Both, 10 reps, Strengthening Gluteal Sets: AROM, 10 reps, Both, Strengthening Short Arc Quad: Strengthening, 5 reps, Both, AROM Straight Leg Raises: 5 reps, Both, AROM, Strengthening Marching in Standing: 5 reps, Both, Strengthening, AROM General Exercises - Upper Extremity Elbow Flexion: AROM, 10 reps, Both, Strengthening Other Exercises Other Exercises: Pt able to perform 5-6 low amplitude mini squats once standing Other Exercises: Pt provided education on the benefits and frequency of mobility and bed exercises for functional muscle activation   Assessment/Plan    PT Assessment Patient needs continued PT services  PT Problem List Decreased strength;Decreased range of motion;Decreased activity tolerance;Decreased  balance;Decreased mobility;Decreased coordination       PT Treatment Interventions DME instruction;Therapeutic exercise;Gait training;Stair training;Functional mobility training;Therapeutic activities    PT Goals (Current goals can be found in the Care Plan section)  Acute Rehab PT Goals Patient Stated Goal: walk better PT Goal Formulation: With patient Time For Goal Achievement: 08/18/23 Potential to Achieve Goals: Fair    Frequency Min 1X/week     Co-evaluation               AM-PAC PT "6 Clicks" Mobility  Outcome Measure Help needed turning from your back to your side while in a flat bed without using bedrails?: A Little Help needed moving from lying on your back to sitting on the side of a flat bed without using bedrails?: A Little Help needed moving to and from a bed to a chair (including a wheelchair)?: A Little Help needed standing up from a chair using your arms (e.g., wheelchair or bedside chair)?: A Little Help needed to walk in hospital room?: A Lot Help needed climbing 3-5 steps with a railing? : A Lot 6 Click Score: 16    End of Session Equipment Utilized During Treatment: Gait belt Activity Tolerance: Patient tolerated treatment well Patient left: in bed;with nursing/sitter in room Nurse Communication: Mobility status PT Visit Diagnosis: Unsteadiness on feet (R26.81);Muscle weakness (generalized) (M62.81);Difficulty in walking, not elsewhere classified (R26.2)    Time: 0347-4259 PT Time Calculation (min) (  ACUTE ONLY): 66 min   Charges:                 Cecile Sheerer, SPT 08/05/23, 12:57 PM

## 2023-08-05 NOTE — Consult Note (Deleted)
WOC Nurse Consult Note: Reason for Consult: small skin tear left arm Patient found down, ecchymosis over UE and LEs. Covered in melanic stools. Wound type: skin tear Pressure Injury POA: NA Measurement:see nursing flow sheets  Drainage (amount, consistency, odor) no reported drainage  Periwound: ecchymosis  Dressing procedure/placement/frequency: Cleanse left arm skin tear with saline, pat dry Apply single layer of xeroform gauze to the affected area per the nursing skin care order set. Cover with dry dressing or silicone foam based on the status of the patient's skin.  Change every other day   Re consult if needed, will not follow at this time. Thanks  Donnah Levert [employer], RN,CWOCN, CNS, CWON-AP [phone])

## 2023-08-05 NOTE — Progress Notes (Signed)
PROGRESS NOTE    Mathew HETTEL  ZOX:[id number] DOB: 1960-12-22 DOA: 08/04/2023 PCP: Armando Gang, FNP    Brief Narrative:  62 y.o. male with medical history significant of stroke with chronic right hand numbness, ulcerative colitis, GIB, HTN, HLD, alcohol use, marijuana abuse, who presents with altered mental status, fall and GI bleeding.   Patient has AMS, and is unable to provide accurate medical history, therefore, most of the history is obtained by discussing the case with ED physician, per EMS report, and with the nursing staff. I also called his girlfriend by phone who provided some medical history.   Per her girlfriend, patient has history of stroke with chronic right hand numbness.  He had slurred speech when he had stroke in the past which has resolved.  Girlfriend states that patient has intermittent rectal bleeding with dark stool for more than a month.  At her normal baseline, patient is alert and orientated x 3.  Girlfriend states that patient drinks beer and liquor almost every day.   Per EDP, pt had fall 3 days ago and his brother had not heard from pt for 3 days so he went over to check on him, and found pt on the ground. Last known well was approximately 3 days ago.  Patient has generalized weakness, is unable to get up or ambulate. Pt was covered in bloody stool. He has multiple skin bruises in his right cheek, bilateral knees, both forearms.  He has several small skin tear in left forearm.  8/27: Mental status slowly improving.  White count downtrending.  Sepsis physiology is improving.  CK downtrending.   Assessment & Plan:   Principal Problem:   Severe sepsis (HCC) Active Problems:   UTI (urinary tract infection)   Ulcerative colitis (HCC)   GI bleeding   Acute metabolic encephalopathy   Stroke (HCC)   Slurred speech   Fall at home, initial encounter   AKI (acute kidney injury) (HCC)   Rhabdomyolysis   Myocardial injury   HTN (hypertension)   Abnormal  LFTs   HLD (hyperlipidemia)   Alcohol use   Pulmonary nodule  Severe sepsis Severe sepsis criteria met with leukocytosis, tachycardia, elevated lactic acid.  Also associated with altered mentation and AKI.  Urinalysis positive UTI.  Unclear whether this is the only source of infection. Plan: Ciprofloxacin and metronidazole IV fluids Monitor vitals and fever curve Monitor culture   UTI (urinary tract infection) -Follow-up urine culture -Antibiotics as above   Ulcerative colitis and GI bleeding Hemoglobin 14.3 down trended to 11.8.   Likely hemoconcentrated on admission Consideration for UC flare C. difficile negative Plan: Hold Plavix Empiric antibiotics as above IVF IV PPI for now Transfuse as needed hemoglobin less than 7 GI on consult liquid diet for now.  Can likely advance if no plans for endoscopic evaluation   Acute metabolic encephalopathy Multifactorial secondary to UTI/sepsis, extended time down, history of CVA MRI negative Plan: Infectious workup as above Delirium precautions  History of stroke (HCC) -Statin on hold due to rhabdomyolysis -Hold Plavix due to rectal bleeding    Fall at home, initial encounter  Images are negative for acute injury. -PT/OT -Fall precaution -Wound care consult for skin tear -TOC consult   AKI (acute kidney injury) (HCC)  Likely multifactorial etiology, including UTI, rhabdomyolysis, dehydration.   No hydronephrosis by CT scan Plan: IVF Recheck renal function in a.m.   Rhabdomyolysis: CK level 2659 on admission, downtrending -IV fluids above    Myocardial injury Troponin  36 --> 42, likely demand ischemia. Antiplatelet and statin on hold   HTN (hypertension) Metoprolol currently on hold, restart as appropriate -IV hydralazine as needed   Abnormal LFTs  Likely due to sepsis and rhabdomyolysis.   Alcohol use may have contributed partially.   No biliary ductal dilation by CT scan   HLD (hyperlipidemia) Statin  on hold   Alcohol use -CIWA protocol   Pulmonary nodule: incidental findings by CT scan -Follow-up with PCP   DVT prophylaxis: SCDs Code Status: Full Family Communication: None.  Attempted to call friend [name] [phone].  No answer. Disposition Plan: Status is: Inpatient Remains inpatient appropriate because: Multiple acute issues as above   Level of care: Progressive  Consultants:  GI WOC  Procedures:  None  Antimicrobials: Ciprofloxacin Metronidazole   Subjective: Seen and examined.  Resting bed.  Lethargic but oriented x 3.  No apparent distress  Objective: Vitals:   08/05/23 0009 08/05/23 0509 08/05/23 0829 08/05/23 1214  BP: 104/62 95/78 99/69  99/73  Pulse: (!) 107 (!) 107 85 95  Resp: 17  15 17   Temp: 98.9 F (37.2 C) (!) 97.5 F (36.4 C) 97.8 F (36.6 C) (!) 97.4 F (36.3 C)  TempSrc: Axillary  Oral Oral  SpO2: 97% 99% 98% 98%  Weight:      Height:        Intake/Output Summary (Last 24 hours) at 08/05/2023 1246 Last data filed at 08/05/2023 0500 Gross per 24 hour  Intake 3582.95 ml  Output 75 ml  Net 3507.95 ml   Filed Weights   08/04/23 1532  Weight: 74.4 kg    Examination:  General exam: Appears weak and fatigued.  Appears chronically ill Respiratory system: Scattered crackles bilaterally.  Normal work of breathing.  Room air Cardiovascular system: S1-S2, RRR, no murmurs, no pedal edema Gastrointestinal system: Thin, soft, NT/ND, normal bowel sounds Central nervous system: Lethargic but alert.  Oriented x 3.  No focal deficits Extremities: Symmetrically decreased power bilaterally Skin: Bruise under right eye.  Skin tears. Psychiatry: Judgement and insight appear impaired. Mood & affect flattened.     Data Reviewed: I have personally reviewed following labs and imaging studies  CBC: Recent Labs  Lab 08/04/23 1538 08/04/23 1936 08/05/23 0104 08/05/23 0648  WBC 18.5* 14.6* 13.9* 11.3*  NEUTROABS 16.2*  --   --   --    HGB 14.3 12.4* 12.5* 11.8*  HCT 42.3 35.4* 36.0* 33.6*  MCV 84.3 82.7 83.3 82.6  PLT 361 267 257 221   Basic Metabolic Panel: Recent Labs  Lab 08/04/23 1538 08/04/23 1539 08/05/23 0648  NA 135  --  129*  K 4.1  --  3.9  CL 101  --  103  CO2 17*  --  21*  GLUCOSE 117*  --  97  BUN 47*  --  47*  CREATININE 4.15*  --  3.12*  CALCIUM 7.7*  --  7.5*  MG  --  2.6*  --    GFR: Estimated Creatinine Clearance: 26 mL/min (A) (by C-G formula based on SCr of 3.12 mg/dL (H)). Liver Function Tests: Recent Labs  Lab 08/04/23 1538 08/05/23 0648  AST 154* 126*  ALT 111* 95*  ALKPHOS 51 40  BILITOT 0.8 0.7  PROT 5.4* 4.3*  ALBUMIN 2.4* 1.8*   No results for input(s): "LIPASE", "AMYLASE" in the last 168 hours. No results for input(s): "AMMONIA" in the last 168 hours. Coagulation Profile: Recent Labs  Lab 08/04/23 1538  INR 1.2  Cardiac Enzymes: Recent Labs  Lab 08/04/23 1538 08/05/23 0648  CKTOTAL 2,659* 1,450*   BNP (last 3 results) No results for input(s): "PROBNP" in the last 8760 hours. HbA1C: Recent Labs    08/04/23 1538  HGBA1C 4.9   CBG: No results for input(s): "GLUCAP" in the last 168 hours. Lipid Profile: Recent Labs    08/04/23 1539  CHOL 88  HDL 39*  LDLCALC 28  TRIG 161  CHOLHDL 2.3   Thyroid Function Tests: No results for input(s): "TSH", "T4TOTAL", "FREET4", "T3FREE", "THYROIDAB" in the last 72 hours. Anemia Panel: No results for input(s): "VITAMINB12", "FOLATE", "FERRITIN", "TIBC", "IRON", "RETICCTPCT" in the last 72 hours. Sepsis Labs: Recent Labs  Lab 08/04/23 1831 08/04/23 2151 08/04/23 2321 08/05/23 0104 08/05/23 0342  PROCALCITON  --  1.74  --   --   --   LATICACIDVEN 2.4*  --  1.9 1.7 1.2    Recent Results (from the past 240 hour(s))  Culture, blood (Routine x 2)     Status: None (Preliminary result)   Collection Time: 08/04/23  3:39 PM   Specimen: BLOOD  Result Value Ref Range Status   Specimen Description BLOOD BLOOD  RIGHT HAND  Final   Special Requests   Final    BOTTLES DRAWN AEROBIC AND ANAEROBIC Blood Culture adequate volume   Culture   Final    NO GROWTH < 24 HOURS Performed at [facility], [address] [place], [place] [address]    Report Status PENDING  Incomplete  Culture, blood (Routine x 2)     Status: None (Preliminary result)   Collection Time: 08/04/23  3:40 PM   Specimen: BLOOD  Result Value Ref Range Status   Specimen Description BLOOD RIGHT ANTECUBITAL  Final   Special Requests   Final    BOTTLES DRAWN AEROBIC AND ANAEROBIC Blood Culture adequate volume   Culture   Final    NO GROWTH < 24 HOURS Performed at [facility], [address], [place], [place] [address]    Report Status PENDING  Incomplete  C Difficile Quick Screen w PCR reflex     Status: None   Collection Time: 08/05/23  2:10 AM   Specimen: STOOL  Result Value Ref Range Status   C Diff antigen NEGATIVE NEGATIVE Final   C Diff toxin NEGATIVE NEGATIVE Final   C Diff interpretation No C. difficile detected.  Final    Comment: Performed at [facility], [address], [place], [place] [address]         Radiology Studies: MR BRAIN WO CONTRAST  Result Date: 08/04/2023 CLINICAL DATA:  Acute neurologic deficit. Bilateral lower extremity weakness EXAM: MRI HEAD WITHOUT CONTRAST TECHNIQUE: Multiplanar, multiecho pulse sequences of the brain and surrounding structures were obtained without intravenous contrast. COMPARISON:  08/22/2014 FINDINGS: Brain: No acute infarct, mass effect or extra-axial collection. No acute or chronic hemorrhage. There is confluent hyperintense T2-weighted signal within the white matter. Generalized volume loss. 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 The midline structures are normal. Vascular: Major flow voids are preserved. Skull and upper cervical spine: Normal calvarium and skull base.  Visualized upper cervical spine and soft tissues are normal. Sinuses/Orbits:No paranasal sinus fluid levels or advanced mucosal thickening. No mastoid or middle ear effusion. Normal orbits. IMPRESSION: 1. No acute intracranial abnormality. 2. 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 3. 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 Electronically Signed   By:  Deatra Robinson M.D.   On: 08/04/2023 22:50   CT CHEST ABDOMEN PELVIS WO CONTRAST  Result Date: 08/04/2023 CLINICAL DATA:  Patient was found down, potentially for 3 days. EXAM: CT CHEST, ABDOMEN AND PELVIS WITHOUT CONTRAST TECHNIQUE: Multidetector CT imaging of the chest, abdomen and pelvis was performed following the standard protocol without IV contrast. RADIATION DOSE REDUCTION: This exam was performed according to the departmental dose-optimization program which includes automated exposure control, adjustment of the mA and/or kV according to patient size and/or use of iterative reconstruction technique. COMPARISON:  Abdomen and pelvis CT 11/22/2015 FINDINGS: CT CHEST FINDINGS Cardiovascular: The heart size is normal. No substantial pericardial effusion. Mild atherosclerotic calcification is noted in the wall of the thoracic aorta. Mediastinum/Nodes: No mediastinal lymphadenopathy. 9 mm posterior right thyroid nodule. Not clinically significant; no follow-up imaging recommended. No evidence for gross hilar lymphadenopathy although assessment is limited by the lack of intravenous contrast on the current study. The esophagus has normal imaging features. There is no axillary lymphadenopathy. (ref: J Am Coll Radiol. 2015 Feb;12(2): 143-50). Lungs/Pleura: Centrilobular and paraseptal emphysema evident. 3 mm posterior right lower lobe nodule identified on 146/4. 2 mm left upper lobe nodule identified on 60/4. No focal airspace consolidation. No pleural effusion. Musculoskeletal: No worrisome lytic or sclerotic osseous abnormality. CT  ABDOMEN PELVIS FINDINGS Hepatobiliary: No suspicious focal abnormality in the liver on this study without intravenous contrast. Increased attenuation in the gallbladder lumen may reflect sludge. No intrahepatic or extrahepatic biliary dilation. Pancreas: No focal mass lesion. No dilatation of the main duct. No intraparenchymal cyst. No peripancreatic edema. Spleen: No splenomegaly. No suspicious focal mass lesion. Adrenals/Urinary Tract: No adrenal nodule or mass. Right kidney unremarkable. 1 mm nonobstructing stone identified interpolar left kidney with another punctate stone in the lower pole left kidney. No evidence for hydroureter. The urinary bladder appears normal for the degree of distention. Stomach/Bowel: Stomach is unremarkable. No gastric wall thickening. No evidence of outlet obstruction. Duodenum is normally positioned as is the ligament of Treitz. No small bowel wall thickening. No small bowel dilatation. The terminal ileum is normal. The appendix is not well visualized, but there is no edema or inflammation in the region of the cecal tip to suggest appendicitis. No gross colonic mass. No colonic wall thickening. Vascular/Lymphatic: There is mild atherosclerotic calcification of the abdominal aorta without aneurysm. There is no gastrohepatic or hepatoduodenal ligament lymphadenopathy. No retroperitoneal or mesenteric lymphadenopathy. Left-sided IVC noted (normal variant). No pelvic sidewall lymphadenopathy. Reproductive: The prostate gland and seminal vesicles are unremarkable. Other: No intraperitoneal free fluid. Musculoskeletal: No worrisome lytic or sclerotic osseous abnormality. IMPRESSION: 1. No acute findings in the chest, abdomen, or pelvis. Specifically, no acute findings in the chest, abdomen, or pelvis 2. Tiny bilateral pulmonary nodules measuring up to 3 mm. No follow-up needed if patient is low-risk (and has no known or suspected primary neoplasm). Non-contrast chest CT can be considered  in 12 months if patient is high-risk. This recommendation follows the consensus statement: Guidelines for Management of Incidental Pulmonary Nodules Detected on CT Images: From the Fleischner Society 2017; Radiology 2017; 284:228-243. 3. Aortic Atherosclerosis (ICD10-I70.0) and Emphysema (ICD10-J43.9). Electronically Signed   By: Kennith Center M.D.   On: 08/04/2023 18:08   DG Knee 1-2 Views Left  Result Date: 08/04/2023 CLINICAL DATA:  Unwitnessed fall.  Left knee bruises. EXAM: LEFT KNEE - 1-2 VIEW COMPARISON:  None Available. FINDINGS: No evidence of fracture, dislocation, or joint effusion. No evidence of arthropathy or other focal bone  abnormality. Soft tissues are unremarkable. IMPRESSION: Negative. Electronically Signed   By: Lupita Raider M.D.   On: 08/04/2023 17:41   CT Head Wo Contrast  Result Date: 08/04/2023 CLINICAL DATA:  Neck trauma, intoxicated or obtunded (Age >= 16y); Mental status change, unknown cause. EXAM: CT HEAD WITHOUT CONTRAST CT CERVICAL SPINE WITHOUT CONTRAST TECHNIQUE: Multidetector CT imaging of the head and cervical spine was performed following the standard protocol without intravenous contrast. Multiplanar CT image reconstructions of the cervical spine were also generated. RADIATION DOSE REDUCTION: This exam was performed according to the departmental dose-optimization program which includes automated exposure control, adjustment of the mA and/or kV according to patient size and/or use of iterative reconstruction technique. COMPARISON:  CT head and cervical spine 08/20/2014. FINDINGS: CT HEAD FINDINGS Brain: Interval extension of encephalomalacia in the bilateral PCA territories with new encephalomalacia in the posterior right frontal lobe, consistent with chronic infarct. Unchanged small area of old infarct in the left middle frontal gyrus. Old lacunar infarcts in the bilateral cerebellar hemispheres no acute hemorrhage, hydrocephalus, extra-axial collection, mass effect or  midline shift. Vascular: No hyperdense vessel or unexpected calcification. Skull: No calvarial fracture or suspicious bone lesion. Skull base is unremarkable. Sinuses/Orbits: No acute finding. Other: None. CT CERVICAL SPINE FINDINGS Alignment: Normal. Skull base and vertebrae: No acute fracture. Normal craniocervical junction. No suspicious bone lesions. Soft tissues and spinal canal: No prevertebral fluid or swelling. No visible canal hematoma. Disc levels: Mild cervical spondylosis without high-grade spinal canal stenosis. Upper chest: No acute findings. Other: None. IMPRESSION: 1. No acute intracranial abnormality. 2. Interval extension of encephalomalacia in the bilateral PCA territories with new encephalomalacia in the posterior right frontal lobe, consistent with chronic infarct. 3. No acute cervical spine fracture or traumatic listhesis. Electronically Signed   By: Orvan Falconer M.D.   On: 08/04/2023 17:40   CT Cervical Spine Wo Contrast  Result Date: 08/04/2023 CLINICAL DATA:  Neck trauma, intoxicated or obtunded (Age >= 16y); Mental status change, unknown cause. EXAM: CT HEAD WITHOUT CONTRAST CT CERVICAL SPINE WITHOUT CONTRAST TECHNIQUE: Multidetector CT imaging of the head and cervical spine was performed following the standard protocol without intravenous contrast. Multiplanar CT image reconstructions of the cervical spine were also generated. RADIATION DOSE REDUCTION: This exam was performed according to the departmental dose-optimization program which includes automated exposure control, adjustment of the mA and/or kV according to patient size and/or use of iterative reconstruction technique. COMPARISON:  CT head and cervical spine 08/20/2014. FINDINGS: CT HEAD FINDINGS Brain: Interval extension of encephalomalacia in the bilateral PCA territories with new encephalomalacia in the posterior right frontal lobe, consistent with chronic infarct. Unchanged small area of old infarct in the left middle  frontal gyrus. Old lacunar infarcts in the bilateral cerebellar hemispheres no acute hemorrhage, hydrocephalus, extra-axial collection, mass effect or midline shift. Vascular: No hyperdense vessel or unexpected calcification. Skull: No calvarial fracture or suspicious bone lesion. Skull base is unremarkable. Sinuses/Orbits: No acute finding. Other: None. CT CERVICAL SPINE FINDINGS Alignment: Normal. Skull base and vertebrae: No acute fracture. Normal craniocervical junction. No suspicious bone lesions. Soft tissues and spinal canal: No prevertebral fluid or swelling. No visible canal hematoma. Disc levels: Mild cervical spondylosis without high-grade spinal canal stenosis. Upper chest: No acute findings. Other: None. IMPRESSION: 1. No acute intracranial abnormality. 2. Interval extension of encephalomalacia in the bilateral PCA territories with new encephalomalacia in the posterior right frontal lobe, consistent with chronic infarct. 3. No acute cervical spine fracture or traumatic listhesis. Electronically  Signed   By: Orvan Falconer M.D.   On: 08/04/2023 17:40   DG Forearm Right  Result Date: 08/04/2023 CLINICAL DATA:  Unwitnessed fall.  Bruising of right arm. EXAM: RIGHT FOREARM - 2 VIEW COMPARISON:  None Available. FINDINGS: There is no evidence of fracture or other focal bone lesions. Soft tissues are unremarkable. IMPRESSION: Negative. Electronically Signed   By: Lupita Raider M.D.   On: 08/04/2023 17:39   DG Humerus Left  Result Date: 08/04/2023 CLINICAL DATA:  Status post fall with multiple bruises to the upper and lower extremities EXAM: LEFT HUMERUS - 2 VIEW; LEFT FOREARM - 2 VIEW COMPARISON:  None Available. FINDINGS: There is no evidence of fracture or other focal bone lesions. Soft tissues are unremarkable. IMPRESSION: No acute fracture or dislocation. Electronically Signed   By: Agustin Cree M.D.   On: 08/04/2023 17:38   DG Forearm Left  Result Date: 08/04/2023 CLINICAL DATA:  Status post  fall with multiple bruises to the upper and lower extremities EXAM: LEFT HUMERUS - 2 VIEW; LEFT FOREARM - 2 VIEW COMPARISON:  None Available. FINDINGS: There is no evidence of fracture or other focal bone lesions. Soft tissues are unremarkable. IMPRESSION: No acute fracture or dislocation. Electronically Signed   By: Agustin Cree M.D.   On: 08/04/2023 17:38   DG Tibia/Fibula Left  Result Date: 08/04/2023 CLINICAL DATA:  Unwitnessed fall.  Bruising of left leg. EXAM: LEFT TIBIA AND FIBULA - 2 VIEW COMPARISON:  None Available. FINDINGS: There is no evidence of fracture or other focal bone lesions. Soft tissues are unremarkable. IMPRESSION: Negative. Electronically Signed   By: Lupita Raider M.D.   On: 08/04/2023 17:38   DG Tibia/Fibula Right  Result Date: 08/04/2023 CLINICAL DATA:  Multifocal bruises and pressure type wounds status post fall. GI bleed. EXAM: RIGHT KNEE - 1-2 VIEW; RIGHT TIBIA AND FIBULA - 2 VIEW COMPARISON:  None Available. FINDINGS: The mineralization and alignment are normal. There is no evidence of acute fracture or dislocation. The joint spaces appear preserved at the ankle and knee. No evidence of joint effusion, foreign body or soft tissue emphysema. IMPRESSION: No acute osseous findings or significant soft tissue abnormalities identified within the right knee or lower leg. Electronically Signed   By: Carey Bullocks M.D.   On: 08/04/2023 17:35   DG Knee 1-2 Views Right  Result Date: 08/04/2023 CLINICAL DATA:  Multifocal bruises and pressure type wounds status post fall. GI bleed. EXAM: RIGHT KNEE - 1-2 VIEW; RIGHT TIBIA AND FIBULA - 2 VIEW COMPARISON:  None Available. FINDINGS: The mineralization and alignment are normal. There is no evidence of acute fracture or dislocation. The joint spaces appear preserved at the ankle and knee. No evidence of joint effusion, foreign body or soft tissue emphysema. IMPRESSION: No acute osseous findings or significant soft tissue abnormalities  identified within the right knee or lower leg. Electronically Signed   By: Carey Bullocks M.D.   On: 08/04/2023 17:35   DG Humerus Right  Result Date: 08/04/2023 CLINICAL DATA:  Larey Seat, bruising EXAM: RIGHT HUMERUS - 2+ VIEW COMPARISON:  12/17/2013 FINDINGS: Frontal and lateral views of the right humerus are obtained. No acute displaced fracture. Alignment of the right shoulder and elbow is anatomic. Mild acromioclavicular and glenohumeral joint osteoarthritis. Soft tissues are unremarkable. IMPRESSION: 1. 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 2. No acute fracture. Electronically Signed   By: Sharlet Salina M.D.   On: 08/04/2023 17:32   DG Chest [facility]  1 View  Result Date: 08/04/2023 CLINICAL DATA:  Short of breath, gastrointestinal bleeding EXAM: PORTABLE CHEST 1 VIEW COMPARISON:  08/20/2014 FINDINGS: 2 frontal views of the chest demonstrate an unremarkable cardiac silhouette. No acute airspace disease, effusion, or pneumothorax. No acute bony abnormality. IMPRESSION: 1. No acute intrathoracic process. Electronically Signed   By: Sharlet Salina M.D.   On: 08/04/2023 16:36        Scheduled Meds:  cholecalciferol  5,000 Units Oral Daily   folic acid  1 mg Oral Daily   LORazepam  0-4 mg Intravenous Q6H   Followed by   Melene Muller ON 08/06/2023] LORazepam  0-4 mg Intravenous Q12H   multivitamin with minerals  1 tablet Oral Daily   mupirocin ointment   Topical Daily   pantoprazole (PROTONIX) IV  40 mg Intravenous Q12H   thiamine  100 mg Oral Daily   Or   thiamine  100 mg Intravenous Daily   Continuous Infusions:  ciprofloxacin     lactated ringers 150 mL/hr at 08/05/23 0846   metronidazole 500 mg (08/05/23 0851)     LOS: 1 day      Tresa Moore, MD [facility]   If 7PM-7AM, please contact night-coverage  08/05/2023, 12:46 PM

## 2023-08-05 NOTE — Consult Note (Signed)
Midge Minium, MD [facility]  [address] [place], [place] [address] Phone: [phone] Fax : [phone]  Consultation  Referring Provider:     Dr. Clyde Lundborg Primary Care Physician:  Armando Gang, FNP Primary Gastroenterologist:  [facility]         Reason for Consultation:     Sepsis diarrhea and history of ulcerative colitis  Date of Admission:  08/04/2023 Date of Consultation:  08/05/2023         HPI:   [name] is a 62 y.o. male with a history of significant health deterioration and weight loss over the last couple of weeks.  The patient had been followed by [facility] GI with his last colonoscopy being 2017.  The patient's biopsies at that time showed moderate chronic active colitis.  The patient has not followed up with his GI doctors nor has he followed up with a primary care provider.  The patient has a history of a stroke with chronic right hand numbness hypertension alcohol use marijuana use and was admitted with altered mental status.  The patient states he has always had diarrhea but now it has become bloody diarrhea.  In the emergency department the patient's girlfriend had reported the patient to have had intermittent rectal bleeding with dark stools for more than a month.  She also had mentioned that the patient drinks beer and liquor almost every day.  The patient's brother who was in the room states that he was able to sit on the porch and help around the yard prior to this recent decompensation.   Past Medical History:  Diagnosis Date   Dysphagia    Hyperlipidemia    Hypertension    Stroke (HCC)    Ulcerative colitis [facility])     Past Surgical History:  Procedure Laterality Date   COLONOSCOPY WITH PROPOFOL N/A 02/08/2016   Procedure: COLONOSCOPY WITH PROPOFOL;  Surgeon: Wallace Cullens, MD;  Location: [facility] ENDOSCOPY;  Service: Gastroenterology;  Laterality: N/A;   ESOPHAGOGASTRODUODENOSCOPY (EGD) WITH PROPOFOL N/A 02/08/2016   Procedure:  ESOPHAGOGASTRODUODENOSCOPY (EGD) WITH PROPOFOL;  Surgeon: Wallace Cullens, MD;  Location: [facility] ENDOSCOPY;  Service: Gastroenterology;  Laterality: N/A;   NO PAST SURGERIES      Prior to Admission medications   Medication Sig Start Date End Date Taking? Authorizing Provider  atorvastatin (LIPITOR) 20 MG tablet Take 20 mg by mouth at bedtime.     [provider]  balsalazide (COLAZAL) 750 MG capsule Take 750 mg by mouth 3 (three) times daily.    [provider]  Cholecalciferol (VITAMIN D3) 5000 UNITS TABS Take 5,000 Units by mouth daily.    [provider]  clopidogrel (PLAVIX) 75 MG tablet Take 75 mg by mouth daily.    [provider]  metoprolol succinate (TOPROL-XL) 25 MG 24 hr tablet Take 12.5 mg by mouth every morning.     [provider]  traZODone (DESYREL) 50 MG tablet Take 50 mg by mouth at bedtime as needed for sleep.    [provider]    Family History  Problem Relation Age of Onset   Breast cancer Mother    Diabetes Father    Hypertension Father    Breast cancer Sister      Social History   Tobacco Use   Smoking status: Never   Smokeless tobacco: Never  Substance Use Topics   Alcohol use: Yes   Drug use: Yes    Types: Marijuana  Allergies as of 08/04/2023 - Review Complete 08/04/2023  Allergen Reaction Noted   Penicillins Anaphylaxis 09/13/2015   Cephalosporins Other (See Comments) 09/13/2015    Review of Systems:    All systems reviewed and negative except where noted in HPI.   Physical Exam:  Vital signs in last 24 hours: Temp:  [95.7 F (35.4 C)-98.9 F (37.2 C)] 97.8 F (36.6 C) (08/27 0829) Pulse Rate:  [85-107] 85 (08/27 0829) Resp:  [15-19] 15 (08/27 0829) BP: (91-135)/(62-112) 99/69 (08/27 0829) SpO2:  [97 %-100 %] 98 % (08/27 0829) Weight:  [74.4 kg] 74.4 kg (08/26 1532) Last BM Date : 05/04/23 General:   Pleasant, cooperative in NAD Head:  Normocephalic and atraumatic. Eyes:   No icterus.    Conjunctiva pink. PERRLA. Ears:  Normal auditory acuity. Neck:  Supple; no masses or thyroidomegaly Lungs: Respirations even and unlabored. Lungs clear to auscultation bilaterally.   No wheezes, crackles, or rhonchi.  Heart:  Regular rate and rhythm;  Without murmur, clicks, rubs or gallops Abdomen:  Soft, nondistended, nontender. Normal bowel sounds. No appreciable masses or hepatomegaly.  No rebound or guarding.  Rectal:  Not performed. Msk:  Symmetrical without gross deformities.    Extremities:  Without edema, cyanosis or clubbing. Neurologic:  Alert and oriented x3; right hand numbness. Skin:  Intact without significant lesions or rashes. Cervical Nodes:  No significant cervical adenopathy. Psych:  Alert and cooperative. Normal affect.  LAB RESULTS: Recent Labs    08/04/23 1936 08/05/23 0104 08/05/23 0648  WBC 14.6* 13.9* 11.3*  HGB 12.4* 12.5* 11.8*  HCT 35.4* 36.0* 33.6*  PLT 267 257 221   BMET Recent Labs    08/04/23 1538 08/05/23 0648  NA 135 129*  K 4.1 3.9  CL 101 103  CO2 17* 21*  GLUCOSE 117* 97  BUN 47* 47*  CREATININE 4.15* 3.12*  CALCIUM 7.7* 7.5*   LFT Recent Labs    08/05/23 0648  PROT 4.3*  ALBUMIN 1.8*  AST 126*  ALT 95*  ALKPHOS 40  BILITOT 0.7   PT/INR Recent Labs    08/04/23 1538  LABPROT 15.8*  INR 1.2    STUDIES: MR BRAIN WO CONTRAST  Result Date: 08/04/2023 CLINICAL DATA:  Acute neurologic deficit. Bilateral lower extremity weakness EXAM: MRI HEAD WITHOUT CONTRAST TECHNIQUE: Multiplanar, multiecho pulse sequences of the brain and surrounding structures were obtained without intravenous contrast. COMPARISON:  08/22/2014 FINDINGS: Brain: No acute infarct, mass effect or extra-axial collection. No acute or chronic hemorrhage. There is confluent hyperintense T2-weighted signal within the white matter. Generalized volume loss. 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  The midline structures are normal. Vascular: Major flow voids are preserved. Skull and upper cervical spine: Normal calvarium and skull base. Visualized upper cervical spine and soft tissues are normal. Sinuses/Orbits:No paranasal sinus fluid levels or advanced mucosal thickening. No mastoid or middle ear effusion. Normal orbits. IMPRESSION: 1. No acute intracranial abnormality. 2. 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 3. 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 Electronically Signed   By: Deatra Robinson M.D.   On: 08/04/2023 22:50   CT CHEST ABDOMEN PELVIS WO CONTRAST  Result Date: 08/04/2023 CLINICAL DATA:  Patient was found down, potentially for 3 days. EXAM: CT CHEST, ABDOMEN AND PELVIS WITHOUT CONTRAST TECHNIQUE: Multidetector CT imaging of the chest, abdomen and pelvis was performed following the standard protocol without IV contrast. RADIATION DOSE REDUCTION: This exam was performed according to  the departmental dose-optimization program which includes automated exposure control, adjustment of the mA and/or kV according to patient size and/or use of iterative reconstruction technique. COMPARISON:  Abdomen and pelvis CT 11/22/2015 FINDINGS: CT CHEST FINDINGS Cardiovascular: The heart size is normal. No substantial pericardial effusion. Mild atherosclerotic calcification is noted in the wall of the thoracic aorta. Mediastinum/Nodes: No mediastinal lymphadenopathy. 9 mm posterior right thyroid nodule. Not clinically significant; no follow-up imaging recommended. No evidence for gross hilar lymphadenopathy although assessment is limited by the lack of intravenous contrast on the current study. The esophagus has normal imaging features. There is no axillary lymphadenopathy. (ref: J Am Coll Radiol. 2015 Feb;12(2): 143-50). Lungs/Pleura: Centrilobular and paraseptal emphysema evident. 3 mm posterior right lower lobe nodule identified on 146/4. 2 mm left upper lobe nodule identified  on 60/4. No focal airspace consolidation. No pleural effusion. Musculoskeletal: No worrisome lytic or sclerotic osseous abnormality. CT ABDOMEN PELVIS FINDINGS Hepatobiliary: No suspicious focal abnormality in the liver on this study without intravenous contrast. Increased attenuation in the gallbladder lumen may reflect sludge. No intrahepatic or extrahepatic biliary dilation. Pancreas: No focal mass lesion. No dilatation of the main duct. No intraparenchymal cyst. No peripancreatic edema. Spleen: No splenomegaly. No suspicious focal mass lesion. Adrenals/Urinary Tract: No adrenal nodule or mass. Right kidney unremarkable. 1 mm nonobstructing stone identified interpolar left kidney with another punctate stone in the lower pole left kidney. No evidence for hydroureter. The urinary bladder appears normal for the degree of distention. Stomach/Bowel: Stomach is unremarkable. No gastric wall thickening. No evidence of outlet obstruction. Duodenum is normally positioned as is the ligament of Treitz. No small bowel wall thickening. No small bowel dilatation. The terminal ileum is normal. The appendix is not well visualized, but there is no edema or inflammation in the region of the cecal tip to suggest appendicitis. No gross colonic mass. No colonic wall thickening. Vascular/Lymphatic: There is mild atherosclerotic calcification of the abdominal aorta without aneurysm. There is no gastrohepatic or hepatoduodenal ligament lymphadenopathy. No retroperitoneal or mesenteric lymphadenopathy. Left-sided IVC noted (normal variant). No pelvic sidewall lymphadenopathy. Reproductive: The prostate gland and seminal vesicles are unremarkable. Other: No intraperitoneal free fluid. Musculoskeletal: No worrisome lytic or sclerotic osseous abnormality. IMPRESSION: 1. No acute findings in the chest, abdomen, or pelvis. Specifically, no acute findings in the chest, abdomen, or pelvis 2. Tiny bilateral pulmonary nodules measuring up to 3  mm. No follow-up needed if patient is low-risk (and has no known or suspected primary neoplasm). Non-contrast chest CT can be considered in 12 months if patient is high-risk. This recommendation follows the consensus statement: Guidelines for Management of Incidental Pulmonary Nodules Detected on CT Images: From the Fleischner Society 2017; Radiology 2017; 284:228-243. 3. Aortic Atherosclerosis (ICD10-I70.0) and Emphysema (ICD10-J43.9). Electronically Signed   By: Kennith Center M.D.   On: 08/04/2023 18:08   DG Knee 1-2 Views Left  Result Date: 08/04/2023 CLINICAL DATA:  Unwitnessed fall.  Left knee bruises. EXAM: LEFT KNEE - 1-2 VIEW COMPARISON:  None Available. FINDINGS: No evidence of fracture, dislocation, or joint effusion. No evidence of arthropathy or other focal bone abnormality. Soft tissues are unremarkable. IMPRESSION: Negative. Electronically Signed   By: Lupita Raider M.D.   On: 08/04/2023 17:41   CT Head Wo Contrast  Result Date: 08/04/2023 CLINICAL DATA:  Neck trauma, intoxicated or obtunded (Age >= 16y); Mental status change, unknown cause. EXAM: CT HEAD WITHOUT CONTRAST CT CERVICAL SPINE WITHOUT CONTRAST TECHNIQUE: Multidetector CT imaging of the head and cervical spine  was performed following the standard protocol without intravenous contrast. Multiplanar CT image reconstructions of the cervical spine were also generated. RADIATION DOSE REDUCTION: This exam was performed according to the departmental dose-optimization program which includes automated exposure control, adjustment of the mA and/or kV according to patient size and/or use of iterative reconstruction technique. COMPARISON:  CT head and cervical spine 08/20/2014. FINDINGS: CT HEAD FINDINGS Brain: Interval extension of encephalomalacia in the bilateral PCA territories with new encephalomalacia in the posterior right frontal lobe, consistent with chronic infarct. Unchanged small area of old infarct in the left middle frontal  gyrus. Old lacunar infarcts in the bilateral cerebellar hemispheres no acute hemorrhage, hydrocephalus, extra-axial collection, mass effect or midline shift. Vascular: No hyperdense vessel or unexpected calcification. Skull: No calvarial fracture or suspicious bone lesion. Skull base is unremarkable. Sinuses/Orbits: No acute finding. Other: None. CT CERVICAL SPINE FINDINGS Alignment: Normal. Skull base and vertebrae: No acute fracture. Normal craniocervical junction. No suspicious bone lesions. Soft tissues and spinal canal: No prevertebral fluid or swelling. No visible canal hematoma. Disc levels: Mild cervical spondylosis without high-grade spinal canal stenosis. Upper chest: No acute findings. Other: None. IMPRESSION: 1. No acute intracranial abnormality. 2. Interval extension of encephalomalacia in the bilateral PCA territories with new encephalomalacia in the posterior right frontal lobe, consistent with chronic infarct. 3. No acute cervical spine fracture or traumatic listhesis. Electronically Signed   By: Orvan Falconer M.D.   On: 08/04/2023 17:40   CT Cervical Spine Wo Contrast  Result Date: 08/04/2023 CLINICAL DATA:  Neck trauma, intoxicated or obtunded (Age >= 16y); Mental status change, unknown cause. EXAM: CT HEAD WITHOUT CONTRAST CT CERVICAL SPINE WITHOUT CONTRAST TECHNIQUE: Multidetector CT imaging of the head and cervical spine was performed following the standard protocol without intravenous contrast. Multiplanar CT image reconstructions of the cervical spine were also generated. RADIATION DOSE REDUCTION: This exam was performed according to the departmental dose-optimization program which includes automated exposure control, adjustment of the mA and/or kV according to patient size and/or use of iterative reconstruction technique. COMPARISON:  CT head and cervical spine 08/20/2014. FINDINGS: CT HEAD FINDINGS Brain: Interval extension of encephalomalacia in the bilateral PCA territories with new  encephalomalacia in the posterior right frontal lobe, consistent with chronic infarct. Unchanged small area of old infarct in the left middle frontal gyrus. Old lacunar infarcts in the bilateral cerebellar hemispheres no acute hemorrhage, hydrocephalus, extra-axial collection, mass effect or midline shift. Vascular: No hyperdense vessel or unexpected calcification. Skull: No calvarial fracture or suspicious bone lesion. Skull base is unremarkable. Sinuses/Orbits: No acute finding. Other: None. CT CERVICAL SPINE FINDINGS Alignment: Normal. Skull base and vertebrae: No acute fracture. Normal craniocervical junction. No suspicious bone lesions. Soft tissues and spinal canal: No prevertebral fluid or swelling. No visible canal hematoma. Disc levels: Mild cervical spondylosis without high-grade spinal canal stenosis. Upper chest: No acute findings. Other: None. IMPRESSION: 1. No acute intracranial abnormality. 2. Interval extension of encephalomalacia in the bilateral PCA territories with new encephalomalacia in the posterior right frontal lobe, consistent with chronic infarct. 3. No acute cervical spine fracture or traumatic listhesis. Electronically Signed   By: Orvan Falconer M.D.   On: 08/04/2023 17:40   DG Forearm Right  Result Date: 08/04/2023 CLINICAL DATA:  Unwitnessed fall.  Bruising of right arm. EXAM: RIGHT FOREARM - 2 VIEW COMPARISON:  None Available. FINDINGS: There is no evidence of fracture or other focal bone lesions. Soft tissues are unremarkable. IMPRESSION: Negative. Electronically Signed   By: Roque Lias  Jr M.D.   On: 08/04/2023 17:39   DG Humerus Left  Result Date: 08/04/2023 CLINICAL DATA:  Status post fall with multiple bruises to the upper and lower extremities EXAM: LEFT HUMERUS - 2 VIEW; LEFT FOREARM - 2 VIEW COMPARISON:  None Available. FINDINGS: There is no evidence of fracture or other focal bone lesions. Soft tissues are unremarkable. IMPRESSION: No acute fracture or dislocation.  Electronically Signed   By: Agustin Cree M.D.   On: 08/04/2023 17:38   DG Forearm Left  Result Date: 08/04/2023 CLINICAL DATA:  Status post fall with multiple bruises to the upper and lower extremities EXAM: LEFT HUMERUS - 2 VIEW; LEFT FOREARM - 2 VIEW COMPARISON:  None Available. FINDINGS: There is no evidence of fracture or other focal bone lesions. Soft tissues are unremarkable. IMPRESSION: No acute fracture or dislocation. Electronically Signed   By: Agustin Cree M.D.   On: 08/04/2023 17:38   DG Tibia/Fibula Left  Result Date: 08/04/2023 CLINICAL DATA:  Unwitnessed fall.  Bruising of left leg. EXAM: LEFT TIBIA AND FIBULA - 2 VIEW COMPARISON:  None Available. FINDINGS: There is no evidence of fracture or other focal bone lesions. Soft tissues are unremarkable. IMPRESSION: Negative. Electronically Signed   By: Lupita Raider M.D.   On: 08/04/2023 17:38   DG Tibia/Fibula Right  Result Date: 08/04/2023 CLINICAL DATA:  Multifocal bruises and pressure type wounds status post fall. GI bleed. EXAM: RIGHT KNEE - 1-2 VIEW; RIGHT TIBIA AND FIBULA - 2 VIEW COMPARISON:  None Available. FINDINGS: The mineralization and alignment are normal. There is no evidence of acute fracture or dislocation. The joint spaces appear preserved at the ankle and knee. No evidence of joint effusion, foreign body or soft tissue emphysema. IMPRESSION: No acute osseous findings or significant soft tissue abnormalities identified within the right knee or lower leg. Electronically Signed   By: Carey Bullocks M.D.   On: 08/04/2023 17:35   DG Knee 1-2 Views Right  Result Date: 08/04/2023 CLINICAL DATA:  Multifocal bruises and pressure type wounds status post fall. GI bleed. EXAM: RIGHT KNEE - 1-2 VIEW; RIGHT TIBIA AND FIBULA - 2 VIEW COMPARISON:  None Available. FINDINGS: The mineralization and alignment are normal. There is no evidence of acute fracture or dislocation. The joint spaces appear preserved at the ankle and knee. No evidence  of joint effusion, foreign body or soft tissue emphysema. IMPRESSION: No acute osseous findings or significant soft tissue abnormalities identified within the right knee or lower leg. Electronically Signed   By: Carey Bullocks M.D.   On: 08/04/2023 17:35   DG Humerus Right  Result Date: 08/04/2023 CLINICAL DATA:  Larey Seat, bruising EXAM: RIGHT HUMERUS - 2+ VIEW COMPARISON:  12/17/2013 FINDINGS: Frontal and lateral views of the right humerus are obtained. No acute displaced fracture. Alignment of the right shoulder and elbow is anatomic. Mild acromioclavicular and glenohumeral joint osteoarthritis. Soft tissues are unremarkable. IMPRESSION: 1. 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 2. No acute fracture. Electronically Signed   By: Sharlet Salina M.D.   On: 08/04/2023 17:32   DG Chest Port 1 View  Result Date: 08/04/2023 CLINICAL DATA:  Short of breath, gastrointestinal bleeding EXAM: PORTABLE CHEST 1 VIEW COMPARISON:  08/20/2014 FINDINGS: 2 frontal views of the chest demonstrate an unremarkable cardiac silhouette. No acute airspace disease, effusion, or pneumothorax. No acute bony abnormality. IMPRESSION: 1. No acute intrathoracic process. Electronically Signed   By: Sharlet Salina M.D.   On: 08/04/2023 16:36  Impression / Plan:   Assessment: Principal Problem:   Severe sepsis (HCC) Active Problems:   Ulcerative colitis (HCC)   GI bleeding   Acute metabolic encephalopathy   Fall at home, initial encounter   AKI (acute kidney injury) (HCC)   Rhabdomyolysis   Myocardial injury   Abnormal LFTs   HTN (hypertension)   HLD (hyperlipidemia)   Stroke [facility])   UTI (urinary tract infection)   Alcohol use   Slurred speech   Pulmonary nodule   [name] is a 62 y.o. y/o male with sepsis and diarrhea with a history of ulcerative colitis.  The patient has not been following with a primary care provider nor has he been following with a gastroenterologist.  The patient has been  deteriorating recently and has had diarrhea chronically.  The rectal bleeding has been off and on.  The patient's C. difficile was negative.  The patient's liver enzymes show AST higher than ALT which may be consistent with alcoholic hepatitis.  His bilirubin is normal.  The patient's platelets are normal at 221.  The patient's hepatitis panel was negative.  Plan:  This patient has sepsis with abnormal liver enzymes and diarrhea with a history of ulcerative colitis.  It is unlikely that his ulcerative colitis is the root of all of his symptoms.  I would recommend a GI panel and we will order that.  The patient will need to be evaluated and followed by GI as an outpatient.  He has been seen at the [place] clinic in the past and has had scopes there.  Starting him on mesalamine for his ulcerative colitis is safe in light of sepsis and I would not recommend treating with any immunological or biological medications at this time.  The patient has been explained the plan and agrees with it.  Thank you for involving me in the care of this patient.      LOS: 1 day   Midge Minium, MD, [facility] 08/05/2023, 12:14 PM,  Pager [phone] 7am-5pm  Check AMION for 5pm -7am coverage and on weekends   Note: This dictation was prepared with Dragon dictation along with smaller phrase technology. Any transcriptional errors that result from this process are unintentional.

## 2023-08-05 NOTE — Plan of Care (Signed)
  Problem: Nutrition: Goal: Adequate nutrition will be maintained Outcome: Progressing   Problem: Coping: Goal: Level of anxiety will decrease Outcome: Progressing   Problem: Safety: Goal: Ability to remain free from injury will improve Outcome: Progressing   Problem: Skin Integrity: Goal: Risk for impaired skin integrity will decrease Outcome: Progressing   Problem: Education: Goal: Knowledge of General Education information will improve Description: Including pain rating scale, medication(s)/side effects and non-pharmacologic comfort measures Outcome: Not Progressing   Problem: Health Behavior/Discharge Planning: Goal: Ability to manage health-related needs will improve Outcome: Not Progressing   Problem: Activity: Goal: Risk for activity intolerance will decrease Outcome: Not Progressing   Problem: Elimination: Goal: Will not experience complications related to bowel motility Outcome: Not Progressing   Problem: Pain Managment: Goal: General experience of comfort will improve Outcome: Not Progressing

## 2023-08-05 NOTE — Evaluation (Signed)
Occupational Therapy Evaluation Patient Details Name: [name] MRN: [id number] DOB: 03/22/1961 Today's Date: 08/05/2023   History of Present Illness Patient is a 62 y.o. male with medical history significant of stroke with chronic right hand numbness, ulcerative colitis, GIB, HTN, HLD, alcohol use, marijuana abuse, who presents with altered mental status, fall and GI bleeding. Current MD assessment includes: Severe sepsis, UTI, Ulcerative colitis and GI bleeding, slurred speech, post fall at home, and Rhabdomyolysis.   Clinical Impression   Patient pleasant, motivated to participate and good efforts put forth. Performs therapeutic activity with multimodal cuing, requiring redirection from perseverating on going home, and encouragement to attempt sitting EOB.  PTA, pt lives alone, does not drive and was IND with ADLs/mobility with no AD. Limited community mobility. Pt's significant other present for eval. Pt presents with decreased awareness into deficits, will require MODA for transfers, and min-MOD for ADL performance. Did not attempt transfers OOB this date - pt anxious, fearful of movement, and unsafe to perform. Patient currently functioning below baseline, and is not safe to return home alone at this time due to increased risk of falls and impaired mobility/ADL performance. Patient will benefit from acute OT to increase overall independence in the areas of ADLs, functional mobility, and reduce caregiver burden in order to safely discharge.        If plan is discharge home, recommend the following: A little help with walking and/or transfers;A little help with bathing/dressing/bathroom;Assistance with cooking/housework;Direct supervision/assist for medications management;Direct supervision/assist for financial management;Assist for transportation;Help with stairs or ramp for entrance    Functional Status Assessment  Patient has had a recent decline in their functional status and  demonstrates the ability to make significant improvements in function in a reasonable and predictable amount of time.  Equipment Recommendations  Other (comment) (defer to next LOC)    Recommendations for Other Services       Precautions / Restrictions Precautions Precautions: Fall Restrictions Weight Bearing Restrictions: No      Mobility Bed Mobility Overal bed mobility: Needs Assistance Bed Mobility: Rolling Rolling: Mod assist, Used rails         General bed mobility comments: 4+ attempts made to bring pt EOB; pt motivated but stopping mid-activity to return supine and becoming increasingly upset at CLOF    Transfers Overall transfer level: Needs assistance                 General transfer comment: Did not attempt to transfer this date          ADL either performed or assessed with clinical judgement   ADL Overall ADL's : Needs assistance/impaired                         Toilet Transfer: Moderate assistance;Rolling walker (2 wheels) Toilet Transfer Details (indicate cue type and reason): Anticipate modA           General ADL Comments: Multiple attempts made to encourge sitting EOB for full ADL assessment; assessment limited this date. Anticipate MODA for transfers, minA for UB ADLs, MODA-MAXA for LB adls     Vision Baseline Vision/History: 1 Wears glasses              Pertinent Vitals/Pain Pain Assessment Pain Assessment: No/denies pain     Extremity/Trunk Assessment Upper Extremity Assessment Upper Extremity Assessment: Generalized weakness;RUE deficits/detail;LUE deficits/detail (Hx of CVA - pt demos bilat UE deficts with hyperextension of PIP joints but able to make bilateral composite  fists given increased time and cuing. Decreased proprioception) RUE Sensation: decreased proprioception RUE Coordination: decreased gross motor LUE Sensation: decreased proprioception LUE Coordination: decreased fine motor   Lower Extremity  Assessment Lower Extremity Assessment: Generalized weakness (pt reports "tightness" and that his limbs are sore)       Communication Communication Communication: Difficulty communicating thoughts/reduced clarity of speech   Cognition Arousal: Alert Behavior During Therapy: WFL for tasks assessed/performed Overall Cognitive Status: Impaired/Different from baseline Area of Impairment: Safety/judgement, Problem solving, Awareness, Memory                     Memory: Decreased short-term memory   Safety/Judgement: Decreased awareness of deficits   Problem Solving: Slow processing, Difficulty sequencing, Decreased initiation General Comments: Pt pleasant and motivated with good efforts throughout. However, demos elevated anxiety/irritation with significant other in room regarding d/c planning but redirects with mod cues     General Comments  multiple bruises face, BLE/UE from fall    Exercises Exercises: General Upper Extremity Total Joint Exercises Ankle Circles/Pumps: AROM, Both, 10 reps, Strengthening, Supine Straight Leg Raises: Supine, Strengthening, AROM, 10 reps General Exercises - Upper Extremity Shoulder Flexion: AROM, Strengthening, Both, 15 reps Elbow Flexion: Strengthening, AROM, Both, 10 reps Digit Composite Flexion: AROM, Both, 10 reps Composite Extension: AROM, Both, 10 reps   Shoulder Instructions      Home Living Family/patient expects to be discharged to:: Private residence Living Arrangements: Alone Available Help at Discharge: Family;Friend(s);Available PRN/intermittently Type of Home: House Home Access: Stairs to enter [employer] of Steps: 2 Entrance Stairs-Rails: Left;Right Home Layout: One level     Bathroom Shower/Tub: Chief Strategy Officer: Standard Bathroom Accessibility: Yes   Home Equipment: Simonne Maffucci          Prior Functioning/Environment Prior Level of Function : Independent/Modified  Independent;History of Falls (last six months)             Mobility Comments: limited community ambulator, does not drive, does not use AD. Per pt report; he had one fall that caused admission to hospital; but denies any additional falls. ADLs Comments: able to shower and dress Ind, does not drive, but cooks        OT Problem List: Decreased strength;Decreased range of motion;Decreased activity tolerance;Impaired balance (sitting and/or standing);Decreased coordination;Decreased safety awareness;Decreased cognition;Decreased knowledge of use of DME or AE      OT Treatment/Interventions:      OT Goals(Current goals can be found in the care plan section) Acute Rehab OT Goals Patient Stated Goal: to go home OT Goal Formulation: With patient/family Time For Goal Achievement: 08/19/23 Potential to Achieve Goals: Fair  OT Frequency: Min 1X/week       AM-PAC OT "6 Clicks" Daily Activity     Outcome Measure Help from another person eating meals?: None Help from another person taking care of personal grooming?: A Little Help from another person toileting, which includes using toliet, bedpan, or urinal?: A Lot Help from another person bathing (including washing, rinsing, drying)?: A Lot Help from another person to put on and taking off regular upper body clothing?: A Little Help from another person to put on and taking off regular lower body clothing?: A Lot 6 Click Score: 16   End of Session    Activity Tolerance: Other (comment) (limited by pt's distress) Patient left: in bed;with call bell/phone within reach;with bed alarm set;with family/visitor present  OT Visit Diagnosis: History of falling (Z91.81);Muscle weakness (generalized) (M62.81);Other  symptoms and signs involving the nervous system (R29.898)                Time: 1610-9604 OT Time Calculation (min): 33 min Charges:  OT General Charges $OT Visit: 1 Visit OT Evaluation $OT Eval Moderate Complexity: 1 Mod OT  Treatments $Therapeutic Activity: 8-22 mins  Demone Lyles L. Camron Essman, OTR/L  08/05/23, 2:51 PM   Lise Auer Rayn Shorb 08/05/2023, 2:46 PM

## 2023-08-05 NOTE — Consult Note (Signed)
WOC Nurse Consult Note: Reason for Consult: patient with fall at home and "down" for prolonged time.  Arrived with abrasions to bilateral arms and legs and incontinence associated skin damage to gluteal folds as he was covered in melena upon arrival.  Significant other reports rectal bleeding with dark stool for a month.   Wound type:  trauma:  fall at home, down for prolonged period.  Daily ETOH consumption.  Pressure Injury POA: NA Measurement: scattered skin tears and abrasions from fall at home.   Scattered erythema and partial thickness tissue loss in gluteal folds from exposure to stool.  Wound bed: pink and moist Drainage (amount, consistency, odor) minimal weeping.  Periwound: bruising from fall to arms and legs.  Dressing procedure/placement/frequency: Cleanse abrasions to arms and legs with soap and water and pat dry.  Apply mupirocin to skin tears/significant abrasions and cover with silicone foam.  Barrier cream to gluteal folds and perirectal area.  Will not follow at this time.  Please re-consult if needed.  Mike Gip MSN, RN, FNP-BC CWON Wound, Ostomy, Continence Nurse Outpatient [facility] [phone] Pager [phone]

## 2023-08-05 NOTE — TOC Progression Note (Signed)
Transition of Care [facility]) - Progression Note    Patient Details  Name: [name] MRN: [id number] Date of Birth: [birth year]  Transition of Care [facility] CM/SW Contact  Truddie Hidden, RN Phone Number: 08/05/2023, 2:05 PM  Clinical Narrative:    TOC continuing ongoing assessments for needs that may arise and changes in discharge plan.        Expected Discharge Plan and Services                                               Social Determinants of Health (SDOH) Interventions SDOH Screenings   Food Insecurity: No Food Insecurity (08/04/2023)  Housing: Low Risk  (08/04/2023)  Transportation Needs: Unmet Transportation Needs (08/04/2023)  Utilities: Not At Risk (08/04/2023)  Tobacco Use: Low Risk  (08/04/2023)    Readmission Risk Interventions     No data to display

## 2023-08-06 DIAGNOSIS — A419 Sepsis, unspecified organism: Secondary | ICD-10-CM | POA: Diagnosis not present

## 2023-08-06 DIAGNOSIS — R6520 Severe sepsis without septic shock: Secondary | ICD-10-CM | POA: Diagnosis not present

## 2023-08-06 DIAGNOSIS — N179 Acute kidney failure, unspecified: Secondary | ICD-10-CM | POA: Diagnosis not present

## 2023-08-06 DIAGNOSIS — K922 Gastrointestinal hemorrhage, unspecified: Secondary | ICD-10-CM | POA: Diagnosis not present

## 2023-08-06 LAB — COMPREHENSIVE METABOLIC PANEL
ALT: 92 U/L — ABNORMAL HIGH (ref 0–44)
AST: 117 U/L — ABNORMAL HIGH (ref 15–41)
Albumin: 1.8 g/dL — ABNORMAL LOW (ref 3.5–5.0)
Alkaline Phosphatase: 39 U/L (ref 38–126)
Anion gap: 4 — ABNORMAL LOW (ref 5–15)
BUN: 36 mg/dL — ABNORMAL HIGH (ref 6–20)
CO2: 23 mmol/L (ref 22–32)
Calcium: 7.5 mg/dL — ABNORMAL LOW (ref 8.9–10.3)
Chloride: 103 mmol/L (ref 98–111)
Creatinine, Ser: 1.87 mg/dL — ABNORMAL HIGH (ref 0.61–1.24)
GFR, Estimated: 41 mL/min — ABNORMAL LOW (ref >60)
Glucose, Bld: 90 mg/dL (ref 70–99)
Potassium: 4.1 mmol/L (ref 3.5–5.1)
Sodium: 130 mmol/L — ABNORMAL LOW (ref 135–145)
Total Bilirubin: 0.6 mg/dL (ref 0.3–1.2)
Total Protein: 4.2 g/dL — ABNORMAL LOW (ref 6.5–8.1)

## 2023-08-06 LAB — CBC WITH DIFFERENTIAL/PLATELET
Abs Immature Granulocytes: 0.57 10*3/uL — ABNORMAL HIGH (ref 0.00–0.07)
Basophils Absolute: 0.1 10*3/uL (ref 0.0–0.1)
Basophils Relative: 1 %
Eosinophils Absolute: 0 10*3/uL (ref 0.0–0.5)
Eosinophils Relative: 0 %
HCT: 32.1 % — ABNORMAL LOW (ref 39.0–52.0)
Hemoglobin: 11.1 g/dL — ABNORMAL LOW (ref 13.0–17.0)
Immature Granulocytes: 6 %
Lymphocytes Relative: 9 %
Lymphs Abs: 0.9 10*3/uL (ref 0.7–4.0)
MCH: 28.9 pg (ref 26.0–34.0)
MCHC: 34.6 g/dL (ref 30.0–36.0)
MCV: 83.6 fL (ref 80.0–100.0)
Monocytes Absolute: 0.6 10*3/uL (ref 0.1–1.0)
Monocytes Relative: 6 %
Neutro Abs: 8.1 10*3/uL — ABNORMAL HIGH (ref 1.7–7.7)
Neutrophils Relative %: 78 %
Platelets: 187 10*3/uL (ref 150–400)
RBC: 3.84 MIL/uL — ABNORMAL LOW (ref 4.22–5.81)
RDW: 15.1 % (ref 11.5–15.5)
Smear Review: NORMAL
WBC: 10.4 10*3/uL (ref 4.0–10.5)
nRBC: 0 % (ref 0.0–0.2)

## 2023-08-06 LAB — URINE CULTURE: Culture: NO GROWTH

## 2023-08-06 LAB — CK: Total CK: 929 U/L — ABNORMAL HIGH (ref 49–397)

## 2023-08-06 LAB — GLUCOSE, CAPILLARY: Glucose-Capillary: 83 mg/dL (ref 70–99)

## 2023-08-06 MED ORDER — LACTATED RINGERS IV SOLN: INTRAVENOUS | Status: DC

## 2023-08-06 MED ORDER — NEPRO/CARBSTEADY PO LIQD
237.0000 mL | Freq: Three times a day (TID) | ORAL | Status: DC
  Administered 2023-08-06 – 2023-08-09 (×5): 237 mL via ORAL

## 2023-08-06 NOTE — Progress Notes (Signed)
Occupational Therapy Treatment Patient Details Name: [name] MRN: [id number] DOB: [birth year] Today's Date: 08/06/2023   History of present illness Patient is a 62 y.o. male with medical history significant of stroke with chronic right hand numbness, ulcerative colitis, GIB, HTN, HLD, alcohol use, marijuana abuse, who presents with altered mental status, fall and GI bleeding. Current MD assessment includes: Severe sepsis, UTI, Ulcerative colitis and GI bleeding, slurred speech, post fall at home, and Rhabdomyolysis.   OT comments  Nursing was assisting Pt. With breakfast upon arrival.  Pt. Requires Max A hand over hand assist for holding a beverage, and utensil, and performing the hand to mouth patterns during self-feeding. Pt. requires mod-maxA performing light grooming tasks. Pt. Requires assist, and cues to attempt to engage his bilateral hands during these tasks. Reviewed strategies for engaging the right hand during tasks 2/2 history of CVA/numbness. Pt. continues to benefit from OT services for ADL training, A/E training, neuro-muscular re-education, and pt. education about home modification, and DME.        If plan is discharge home, recommend the following:  A little help with walking and/or transfers;A little help with bathing/dressing/bathroom;Assistance with cooking/housework;Direct supervision/assist for medications management;Direct supervision/assist for financial management;Assist for transportation;Help with stairs or ramp for entrance   Equipment Recommendations       Recommendations for Other Services      Precautions / Restrictions Precautions Precautions: Fall Restrictions Weight Bearing Restrictions: No       Mobility Bed Mobility                    Transfers    Deferred                     Balance                                           ADL either performed or assessed with clinical judgement   ADL Overall ADL's  : Needs assistance/impaired Eating/Feeding: Maximal assistance;Moderate assistance Eating/Feeding Details (indicate cue type and reason): Pt. requires max hand-over-hand assist for hand to mouth patterns for drinking, and utnesil use. Grooming: Moderate assistance;Maximal assistance Grooming Details (indicate cue type and reason): Max cues, and encouragement to engage bilateral hands in the task.                                    Extremity/Trunk Assessment Upper Extremity Assessment Upper Extremity Assessment: Generalized weakness RUE Sensation: decreased proprioception RUE Coordination: decreased gross motor LUE Sensation: decreased proprioception LUE Coordination: decreased fine motor            Vision Baseline Vision/History: 1 Wears glasses     Perception     Praxis      Cognition Arousal: Alert     Area of Impairment: Safety/judgement, Problem solving, Awareness, Memory                     Memory: Decreased short-term memory       Problem Solving: Slow processing, Difficulty sequencing, Decreased initiation          Exercises      Shoulder Instructions       General Comments      Pertinent Vitals/ Pain       Pain Assessment Pain Assessment: No/denies  pain  Home Living                                          Prior Functioning/Environment              Frequency  Min 1X/week        Progress Toward Goals  OT Goals(current goals can now be found in the care plan section)  Progress towards OT goals: Progressing toward goals  Acute Rehab OT Goals Patient Stated Goal: To reuturn home OT Goal Formulation: With patient/family Time For Goal Achievement: 08/19/23 Potential to Achieve Goals: Fair  Plan      Co-evaluation                 AM-PAC OT "6 Clicks" Daily Activity     Outcome Measure   Help from another person eating meals?: A Lot Help from another person taking care of  personal grooming?: A Lot Help from another person toileting, which includes using toliet, bedpan, or urinal?: A Lot Help from another person bathing (including washing, rinsing, drying)?: A Lot Help from another person to put on and taking off regular upper body clothing?: A Little Help from another person to put on and taking off regular lower body clothing?: A Lot 6 Click Score: 13    End of Session    OT Visit Diagnosis: History of falling (Z91.81);Muscle weakness (generalized) (M62.81);Other symptoms and signs involving the nervous system (R29.898)   Activity Tolerance     Patient Left in bed;with call bell/phone within reach;with family/visitor present;with nursing/sitter in room: Nursing notified of Bed alarm issues, and was present when exiting the room.   Nurse Communication          Time: 4540-9811 OT Time Calculation (min): 25 min  Charges: OT General Charges $OT Visit: 1 Visit OT Treatments $Self Care/Home Management : 23-37 mins  Olegario Messier, MS, OTR/L  Olegario Messier 08/06/2023, 10:38 AM

## 2023-08-06 NOTE — Progress Notes (Signed)
Physical Therapy Treatment Patient Details Name: [name] MRN: [id number] DOB: 1961-07-21 Today's Date: 08/06/2023   History of Present Illness Patient is a 62 y.o. male with medical history significant of stroke with chronic right hand numbness, ulcerative colitis, GIB, HTN, HLD, alcohol use, marijuana abuse, who presents with altered mental status, fall and GI bleeding. Current MD assessment includes: Severe sepsis, UTI, Ulcerative colitis and GI bleeding, slurred speech, post fall at home, and Rhabdomyolysis.    PT Comments  Pt was pleasant and motivated to participate during the session and put forth good effort throughout. Pt required physical assistance and cuing for proper sequencing with all functional tasks but grossly decreased assistance overall compared to prior session.  Pt was able to stand multiple times at the EOB but steps remained effortful and mostly shuffling with min A for stability and to guide the RW.  Of note pt with rectal tube leakage this date with some session time dedicated to assisting nursing with hygiene.  Pt reported no adverse symptoms during the session.  Pt will benefit from continued PT services upon discharge to safely address deficits listed in patient problem list for decreased caregiver assistance and eventual return to PLOF.      If plan is discharge home, recommend the following: A little help with walking and/or transfers;A little help with bathing/dressing/bathroom;Assistance with cooking/housework;Assist for transportation;Help with stairs or ramp for entrance   Can travel by private vehicle     No  Equipment Recommendations  Other (comment) (TBD)    Recommendations for Other Services       Precautions / Restrictions Precautions Precautions: Fall Restrictions Weight Bearing Restrictions: No     Mobility  Bed Mobility Overal bed mobility: Needs Assistance Bed Mobility: Rolling, Supine to Sit, Sit to Supine Rolling: Mod assist, Used  rails   Supine to sit: Mod assist Sit to supine: Mod assist, +2 for physical assistance   General bed mobility comments: Mod verbal cues for sequencing including cues for use of bed rails    Transfers Overall transfer level: Needs assistance Equipment used: Rolling walker (2 wheels) Transfers: Sit to/from Stand Sit to Stand: Mod assist           General transfer comment: Mod verbal cues for hand placement and increased trunk flexion    Ambulation/Gait Ambulation/Gait assistance: Min assist Gait Distance (Feet): 2 Feet Assistive device: Rolling walker (2 wheels) Gait Pattern/deviations: Shuffle, Step-to pattern, Decreased step length - right, Decreased step length - left, Trunk flexed Gait velocity: decreased     General Gait Details: Effortful steps with mod lean on the RW for support with min A for stability and to advance the RW   Stairs             Wheelchair Mobility     Tilt Bed    Modified Rankin (Stroke Patients Only)       Balance Overall balance assessment: Needs assistance Sitting-balance support: Feet supported, Bilateral upper extremity supported Sitting balance-Leahy Scale: Fair     Standing balance support: Reliant on assistive device for balance, During functional activity, Bilateral upper extremity supported Standing balance-Leahy Scale: Poor                              Cognition Arousal: Alert Behavior During Therapy: WFL for tasks assessed/performed Overall Cognitive Status: Impaired/Different from baseline  Exercises Total Joint Exercises Hip ABduction/ADduction: AAROM, Strengthening, Both, 10 reps Straight Leg Raises: Strengthening, 10 reps, AAROM, Both Other Exercises Other Exercises: Rolling left/right x 4    General Comments        Pertinent Vitals/Pain Pain Assessment Pain Assessment: No/denies pain    Home Living                           Prior Function            PT Goals (current goals can now be found in the care plan section) Progress towards PT goals: Progressing toward goals    Frequency    Min 1X/week      PT Plan      Co-evaluation              AM-PAC PT "6 Clicks" Mobility   Outcome Measure  Help needed turning from your back to your side while in a flat bed without using bedrails?: A Little Help needed moving from lying on your back to sitting on the side of a flat bed without using bedrails?: A Little Help needed moving to and from a bed to a chair (including a wheelchair)?: A Lot Help needed standing up from a chair using your arms (e.g., wheelchair or bedside chair)?: A Lot Help needed to walk in hospital room?: Total Help needed climbing 3-5 steps with a railing? : Total 6 Click Score: 12    End of Session Equipment Utilized During Treatment: Gait belt Activity Tolerance: Patient tolerated treatment well Patient left: in bed;with nursing/sitter in room (Pt left with nursing for hygiene) Nurse Communication: Mobility status PT Visit Diagnosis: Unsteadiness on feet (R26.81);Muscle weakness (generalized) (M62.81);Difficulty in walking, not elsewhere classified (R26.2)     Time: 7829-5621 PT Time Calculation (min) (ACUTE ONLY): 48 min  Charges:    $Therapeutic Activity: 23-37 mins PT General Charges $$ ACUTE PT VISIT: 1 Visit                     D. Scott Ashauna Bertholf PT, DPT 08/06/23, 2:20 PM

## 2023-08-06 NOTE — Progress Notes (Signed)
Initial Nutrition Assessment  DOCUMENTATION CODES:   Severe malnutrition in context of chronic illness  INTERVENTION:   -Magic cup TID with meals, each supplement provides 290 kcal and 9 grams of protein  -Nepro Shake po TID, each supplement provides 425 kcal and 19 grams protein  -MVI with minerals daily  NUTRITION DIAGNOSIS:   Severe Malnutrition related to chronic illness (ulcerative colitis) as evidenced by moderate fat depletion, severe fat depletion, moderate muscle depletion, severe muscle depletion.  GOAL:   Patient will meet greater than or equal to 90% of their needs  MONITOR:   PO intake, Supplement acceptance, Diet advancement  REASON FOR ASSESSMENT:   Malnutrition Screening Tool    ASSESSMENT:   Pt with medical history significant of stroke with chronic right hand numbness, ulcerative colitis, GIB, HTN, HLD, alcohol use, marijuana abuse, who presents with altered mental status, fall and GI bleeding.  Pt admitted with severe sepsis secondary to acute UTI.   8/27- s/p BSE- dysphagia 2 diet with nectar thick liquids 8/28- rectal tube placed  Reviewed I/O's: +559 ml x 24 hours and +4.1 L since admission   UOP: 400 ml x 24 hours  Spoke with pt and multiple family members at bedside. Pt reports feeling better and has been tolerating diet well. Pt consumed most of his breakfast and lunch today. Pt has been taking pills in purees and states this helps.   Pt reports good appetite PTA. He usually consumes 2 meals per day (Breakfast: cereal; Dinner: pork chops). He denies any difficulty chewing or swallowing foods PTA.   Pt family endorses a general decline in health over the past 2 weeks, including weakness. Per brother, pt often weeds or picks up sticks and debris when he mows the lawn, however, he is unable to do this now. They think he has lost weight, but unsure of UBW or how much. Wt has been stable over the past month.   Discussed importance of good meal and  supplement intake to promote healing. Pt amenable to supplements.   Per TOC notes, plan for SNF placement.   Medications reviewed and include vitamin D3, folic acid, ativan, thiamine, and lactated ringers infusion @ 75 ml/hr.   Lab Results  Component Value Date   HGBA1C 4.9 08/04/2023   PTA DM medications are .   Labs reviewed: Na: 130, CBGS: 83-109 (inpatient orders for glycemic control are ).    NUTRITION - FOCUSED PHYSICAL EXAM:  Flowsheet Row Most Recent Value  Orbital Region Severe depletion  Upper Arm Region Moderate depletion  Thoracic and Lumbar Region Moderate depletion  Buccal Region Severe depletion  Temple Region Severe depletion  Clavicle Bone Region Severe depletion  Clavicle and Acromion Bone Region Severe depletion  Scapular Bone Region Severe depletion  Dorsal Hand Severe depletion  Patellar Region Moderate depletion  Anterior Thigh Region Moderate depletion  Posterior Calf Region Moderate depletion  Edema (RD Assessment) None  Hair Reviewed  Eyes Reviewed  Mouth Reviewed  Skin Reviewed  Nails Reviewed       Diet Order:   Diet Order             DIET - DYS 1 Room service appropriate? Yes with Assist; Fluid consistency: Thin  Diet effective now                   EDUCATION NEEDS:   Education needs have been addressed  Skin:  Skin Assessment: Skin Integrity Issues: Skin Integrity Issues:: Other (Comment) Other: skin tear to  lt arm  Last BM:  08/06/23 (type 7 via rectal tube)  Height:   Ht Readings from Last 1 Encounters:  08/04/23 5\' 10"  (1.778 m)    Weight:   Wt Readings from Last 1 Encounters:  08/04/23 74.4 kg    Ideal Body Weight:  75.5 kg  BMI:  Body mass index is 23.53 kg/m.  Estimated Nutritional Needs:   Kcal:  2200-2400  Protein:  110-125 grams  Fluid:  > 2 L    Levada Schilling, RD, LDN, CDCES Registered Dietitian II Certified Diabetes Care and Education Specialist Please refer to [facility] for RD and/or RD  on-call/weekend/after hours pager

## 2023-08-06 NOTE — Progress Notes (Addendum)
Speech Language Pathology Treatment: Dysphagia  Patient Details Name: [name] MRN: [id number] DOB: Nov 24, 1961 Today's Date: 08/06/2023 Time: 9562-1308 SLP Time Calculation (min) (ACUTE ONLY): 45 min  Assessment / Plan / Recommendation Clinical Impression  Pt seen for ongoing assessment of swallowing today. Upon entering room, he was lying reclined eating some of his Lunch meal. Girlfriend present. He was alert, verbally responsive and engaged in basic/simple conversation w/ this SLP, but Cognitive decline was apparent in some of his answers and follow through w/ instructions. Unsure of his Baseline Cognition in setting of ETOH use/abuse. During verbal discussion(no po's), throat clearing/PHLEGM was noted prior to more po's -- suspect could be impact from ETOH use/abuse and Esophageal phase dysmotility.  Pt is on RA; wbc wnl. Poor Dentition status/missing dentition.  Pt and girlfriend explained general aspiration precautions and agreed verbally to the need for following them especially sitting upright for all oral intake -- supported behind the back for full upright sitting. Pt assisted w/ positioning d/t min weakness. He continued the minced/chopped foods(girlfriend chopped more) w/ Oral phase deficits noted c/b lengthy munchy mastication, pocketing, and need to expectorate meat boluses. Noted scattered broccoli debris post instruction to swallow w/ verbal cues needed to use lingual sweep to clear/swallow. Attempted to alternate w/ other foods/liquid to aid oral clearing but pt tended to pocket some of the increased texture in buccal area. NO Oral phase deficits were noted w/ liquids and purees -- timely bolus management and complete oral clearing noted.  W/ trials of thin liquids, No immediate, overt clinical s/s of aspiration noted w/ thin liquids trials via straw, cup-- noted more slurping when drinking via cup which could increase risk for aspiration. Straw sips appeared functional though pt  was IMPULSIVE and needed bolus amount monitoring(Pinched straw). He did exhibit delayed throat clear and PHLEGM which appeared SIMILAR TO his presentation at Baseline PRIOR TO po's when talking. Girlfriend stated he hocked and spit PHLEGM at home prior to admit. Pt's respiratory status remained calm and unlabored, vocal quality clear b/t trials post clearing of PHLEGM x2.   Pt appears at risk for Esophageal phase Dysmotility d/t chronic health and ETOH use/abuse issues. ANY Esophageal phase dysmotility can impact the pharyngeal phase of swallowing and increase risk for aspiration of REFLUX material.   Pt appears at reduced risk for aspriation when following aspiration precautions AND using a modified diet of Puree foods(oral phase deficits and Dentition status decline), thin liquids w/ SUPERVISION d/t Impulsive drinking, Cognitive decline.   Recommend a PUREED diet for ease of soft foods w/ gravies added to moisten foods; Thin liquids. Recommend aspiration precautions; Pills CRUSHED vs WHOLE in Puree; tray setup and positioning Upright/forward assistance for meals and all oral intake. Supervision and Feeding Support at meals. REFLUX precautions d/t pt's baseline.  ST services will monitor pt's toleration of diet and provide education on aspiration precautions while admitted; trials to upgrade food/diet as indicated. Pt updated but unsure of his full insight into his medical status/needs. MD/NSG updated. Precautions posted at bedside.       HPI HPI: per admitting H & P "[name] is a 62 y.o. male with medical history significant of stroke with chronic right hand numbness, ulcerative colitis, GIB, HTN, HLD, alcohol use, marijuana abuse, who presents with altered mental status, fall and GI bleeding.     Patient has AMS, and is unable to provide accurate medical history, therefore, most of the history is obtained by discussing the case with ED physician,  per EMS report, and with the nursing staff. I  also called his girlfriend by phone who provided some medical history.     Per her girlfriend, patient has history of stroke with chronic right hand numbness.  He had slurred speech when he had stroke in the past which has resolved.  Girlfriend states that patient has intermittent rectal bleeding with dark stool for more than a month.  At her normal baseline, patient is alert and orientated x 3.  Girlfriend states that patient drinks beer and liquor almost every day.     Per EDP, pt had fall 3 days ago and his brother had not heard from pt for 3 days so he went over to check on him, and found pt on the ground. Last known well was approximately 3 days ago.  Patient has generalized weakness, is unable to get up or ambulate. Pt was covered in bloody stool. He has multiple skin bruises in his right cheek, bilateral knees, both forearms.  He has several small skin tear in left forearm.     When I saw pt in ED, patient is alert, but confused.  He knows his own name, not orientated to the time and place.  He has symmetrical weakness in both legs, moves his arms normally.  He has slurred speech which is new.  No facial droop noted.  No active nausea, vomiting, respiratory distress, cough or shortness of breath noted.  Does not seem to have chest pain and abdominal pain.".   MRI: No acute intracranial abnormality.  2. 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  3. 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   CT of Chest: Centrilobular and paraseptal emphysema evident. 3 mm  posterior right lower lobe nodule identified on 146/4. 2 mm left  upper lobe nodule identified on 60/4. No focal airspace  consolidation. No pleural effusion.      SLP Plan  Continue with current plan of care      Recommendations for follow up therapy are one component of a multi-disciplinary discharge planning process, led by the attending physician.  Recommendations may be updated based on patient status, additional  functional criteria and insurance authorization.    Recommendations  Diet recommendations: Dysphagia 1 (puree);Thin liquid Liquids provided via: Cup;Straw (monitor for impulsive drinking) Medication Administration: Crushed with puree (vs Whole in puree) Supervision: Staff to assist with self feeding;Full supervision/cueing for compensatory strategies Compensations: Minimize environmental distractions;Slow rate;Small sips/bites;Lingual sweep for clearance of pocketing;Multiple dry swallows after each bite/sip;Follow solids with liquid Postural Changes and/or Swallow Maneuvers: Out of bed for meals;Seated upright 90 degrees;Upright 30-60 min after meal (REFLUX precautions)                 (Dietician f/u; Palliative Care f/u for GOC) Oral care BID;Oral care before and after PO;Staff/trained caregiver to provide oral care   Frequent or constant Supervision/Assistance Dysphagia, oropharyngeal phase (R13.12) (ETOH abuse/use; unsure of Cognitive status baseline)     Continue with current plan of care        Jerilynn Som, MS, CCC-SLP Speech Language Pathologist Rehab Services; [facility] -  [phone] (ascom) Makayla Confer  08/06/2023, 5:01 PM

## 2023-08-06 NOTE — [place] FL2 (Signed)
[place] MEDICAID FL2 LEVEL OF CARE FORM     IDENTIFICATION  Patient Name: [name] Birthdate: Sep 25, 1961 Sex: male Admission Date (Current Location): 08/04/2023  [facility] and [place] Number:  Chiropodist and Address:  [facility], [address] [place], [place] [address]      Provider Number: [id number]  Attending Physician Name and Address:  Lurene Shadow, MD  Relative Name and Phone Number:  Karilyn Cota Clearence Cheek)  [phone] [facility]    Current Level of Care: Hospital Recommended Level of Care: Skilled Nursing Facility Prior Approval Number:    Date Approved/Denied:   PASRR Number: [id number]  Discharge Plan: Home    Current Diagnoses: Patient Active Problem List   Diagnosis Date Noted   GI bleeding 08/04/2023   SIRS (systemic inflammatory response syndrome) (HCC) 08/04/2023   Acute metabolic encephalopathy 08/04/2023   Fall at home, initial encounter 08/04/2023   AKI (acute kidney injury) (HCC) 08/04/2023   Rhabdomyolysis 08/04/2023   Myocardial injury 08/04/2023   Abnormal LFTs 08/04/2023   HTN (hypertension) 08/04/2023   HLD (hyperlipidemia) 08/04/2023   Stroke (HCC) 08/04/2023   Severe sepsis (HCC) 08/04/2023   UTI (urinary tract infection) 08/04/2023   Alcohol use 08/04/2023   Slurred speech 08/04/2023   Pulmonary nodule 08/04/2023   Gastrointestinal bleed 12/10/2015   Hyponatremia 12/10/2015   Leukopenia 12/10/2015   Hyperglycemia 12/10/2015   Diarrhea 12/10/2015   Ulcerative colitis (HCC) 12/10/2015   Acute blood loss anemia 12/09/2015   Exacerbation of ulcerative colitis (HCC) 11/22/2015   Ulcerative colitis, chronic (HCC) 11/22/2015    Orientation RESPIRATION BLADDER Height & Weight     Self, Situation, Place  Normal External catheter, Incontinent Weight: 74.4 kg Height:  5\' 10"  (177.8 cm)  BEHAVIORAL SYMPTOMS/MOOD NEUROLOGICAL BOWEL NUTRITION STATUS  Other (Comment) (n/a)  (n/a) Incontinent  Diet (DYS-1)  AMBULATORY STATUS COMMUNICATION OF NEEDS Skin   Limited Assist Verbally Bruising (Abrasions to face leg  abdomen- Skin tear)                       Personal Care Assistance Level of Assistance  Bathing, Dressing Bathing Assistance: Limited assistance   Dressing Assistance: Limited assistance     Functional Limitations Info  Sight Sight Info: Impaired        SPECIAL CARE FACTORS FREQUENCY  PT (By licensed PT), OT (By licensed OT)     PT Frequency: Min 2x weekly OT Frequency: Min 2x weekly            Contractures Contractures Info: Not present    Additional Factors Info  Code Status, Allergies Code Status Info: FULL Allergies Info: Penicillins, Cephalosporins           Current Medications (08/06/2023):  This is the current hospital active medication list Current Facility-Administered Medications  Medication Dose Route Frequency Provider Last Rate Last Admin   cholecalciferol (VITAMIN D3) 25 MCG (1000 UNIT) tablet 5,000 Units  5,000 Units Oral Daily Lorretta Harp, MD   5,000 Units at 08/06/23 0948   ciprofloxacin (CIPRO) IVPB 400 mg  400 mg Intravenous Q24H Lowella Bandy, RPH 200 mL/hr at 08/06/23 1225 400 mg at 08/06/23 1225   folic acid (FOLVITE) tablet 1 mg  1 mg Oral Daily Lorretta Harp, MD   1 mg at 08/06/23 0948   hydrALAZINE (APRESOLINE) injection 5 mg  5 mg Intravenous Q2H PRN Lorretta Harp, MD       lactated ringers infusion   Intravenous Continuous [place],  Adela Glimpse, MD 75 mL/hr at 08/06/23 0948 New Bag at 08/06/23 0948   LORazepam (ATIVAN) injection 0-4 mg  0-4 mg Intravenous Q6H Lorretta Harp, MD       Followed by   LORazepam (ATIVAN) injection 0-4 mg  0-4 mg Intravenous Q12H Lorretta Harp, MD       LORazepam (ATIVAN) tablet 1-4 mg  1-4 mg Oral Q1H PRN Lorretta Harp, MD       Or   LORazepam (ATIVAN) injection 1-4 mg  1-4 mg Intravenous Q1H PRN Lorretta Harp, MD       Mesalamine (ASACOL) DR capsule 800 mg  800 mg Oral TID Midge Minium, MD   800 mg at 08/06/23  1004   metroNIDAZOLE (FLAGYL) IVPB 500 mg  500 mg Intravenous Q12H Lorretta Harp, MD 100 mL/hr at 08/06/23 0953 500 mg at 08/06/23 0953   morphine (PF) 2 MG/ML injection 1 mg  1 mg Intravenous Q4H PRN Lorretta Harp, MD       multivitamin with minerals tablet 1 tablet  1 tablet Oral Daily Lorretta Harp, MD   1 tablet at 08/06/23 8469   mupirocin ointment (BACTROBAN) 2 %   Topical Daily Lolita Patella B, MD   Given at 08/06/23 1004   ondansetron [facility]) injection 4 mg  4 mg Intravenous Q8H PRN Lorretta Harp, MD   4 mg at 08/04/23 2105   pantoprazole (PROTONIX) injection 40 mg  40 mg Intravenous Q12H Lorretta Harp, MD   40 mg at 08/06/23 6295   thiamine (VITAMIN B1) tablet 100 mg  100 mg Oral Daily Lorretta Harp, MD   100 mg at 08/06/23 2841   Or   thiamine (VITAMIN B1) injection 100 mg  100 mg Intravenous Daily Lorretta Harp, MD       traZODone (DESYREL) tablet 50 mg  50 mg Oral QHS PRN Lorretta Harp, MD         Discharge Medications: Please see discharge summary for a list of discharge medications.  Relevant Imaging Results:  Relevant Lab Results:   Additional Information SS# [id number]  Truddie Hidden, RN

## 2023-08-06 NOTE — TOC Progression Note (Signed)
Transition of Care [facility]) - Progression Note    Patient Details  Name: [name] MRN: [id number] Date of Birth: 08/23/1961  [facility]) CM/SW Contact  Truddie Hidden, RN Phone Number: 08/06/2023, 3:34 PM  Clinical Narrative:    Spoke with patient and his friend at the bedside regarding recommendation for SNF. Patient is agreeable and does not have a preference. He would like to discharge to a local SNF. He was advised rehab is STR and auth would still need to be obtain after bed offer is secure.  [place] PASSR obtained, FL2, bed search initiated.         Expected Discharge Plan and Services                                               Social Determinants of Health (SDOH) Interventions SDOH Screenings   Food Insecurity: No Food Insecurity (08/04/2023)  Housing: Low Risk  (08/04/2023)  Transportation Needs: Unmet Transportation Needs (08/04/2023)  Utilities: Not At Risk (08/04/2023)  Tobacco Use: Low Risk  (08/04/2023)    Readmission Risk Interventions     No data to display

## 2023-08-06 NOTE — Progress Notes (Signed)
Received patient to the unit from 2A. Patient is alert to self, place, situation, but unsure of date. Patient has rectal tube. Patient has scabs and bruises to bilat knees and right side of the face from a fall prior to admission. Patient has purewick. Voice clear.   Cornell Barman Zaniel Marineau

## 2023-08-06 NOTE — Progress Notes (Signed)
Progress Note    [name]  ZOX:[id number] DOB: Dec 02, 1961  DOA: 08/04/2023 PCP: Armando Gang, FNP      Brief Narrative:    Medical records reviewed and are as summarized below:  [name] is a 61 y.o. male with medical history significant for stroke with chronic right hand numbness, ulcerative colitis, GIB, HTN, HLD,  alcohol use disorder, marijuana use disorder, who presented to the hospital with altered mental status, fall and GI bleed.  Reportedly, patient has had intermittent rectal bleeding for over a month.  He drinks beer and liquor almost every day. Apparently, he fell about 3 days prior to admission.  His brother did a wellness check on him and found patient on the ground.   He was admitted to the hospital for severe sepsis secondary to acute UTI, acute metabolic encephalopathy, AKI and rhabdomyolysis.    Assessment/Plan:   Principal Problem:   Severe sepsis (HCC) Active Problems:   UTI (urinary tract infection)   Ulcerative colitis (HCC)   GI bleeding   Acute metabolic encephalopathy   Stroke (HCC)   Slurred speech   Fall at home, initial encounter   AKI (acute kidney injury) (HCC)   Rhabdomyolysis   Myocardial injury   HTN (hypertension)   Abnormal LFTs   HLD (hyperlipidemia)   Alcohol use   Pulmonary nodule    Body mass index is 23.53 kg/m.    Severe sepsis secondary to acute UTI: Urine culture did not show any growth.  Continue empiric IV ciprofloxacin and Flagyl   AKI: Improving.  Continue IV fluids. Creatinine on admission was 4.15.  Creatinine was 1.22 on 07/06/2023 (prior to admission). Hyponatremia: Continue IV fluids   Rhabdomyolysis: Improving.  Continue IV fluids   Acute metabolic encephalopathy: Improving.  Mental status is better.   Ulcerative colitis and GI bleeding, diarrhea: Stools are nonbloody.  Plavix on hold.  Continue mesalamine.  Continue IV Protonix.  He has a rectal tube. No plan for endoscopic  workup at this time.  GI has signed off.   S/p fall: PT and OT recommended discharge to SNF   Elevated troponins: Attributed to demand ischemia.   Alcohol use disorder: CIWA protocol Elevated liver enzymes: This is likely from alcohol use disorder.  Numbers are trending down.   Other comorbidities include hyperlipidemia, hypertension, history of stroke  Diet Order             DIET DYS 2 Room service appropriate? Yes; Fluid consistency: Nectar Thick  Diet effective now                            Consultants: Gastroenterologist  Procedures: None    Medications:    cholecalciferol  5,000 Units Oral Daily   folic acid  1 mg Oral Daily   LORazepam  0-4 mg Intravenous Q6H   Followed by   LORazepam  0-4 mg Intravenous Q12H   Mesalamine  800 mg Oral TID   multivitamin with minerals  1 tablet Oral Daily   mupirocin ointment   Topical Daily   pantoprazole (PROTONIX) IV  40 mg Intravenous Q12H   thiamine  100 mg Oral Daily   Or   thiamine  100 mg Intravenous Daily   Continuous Infusions:  ciprofloxacin Stopped (08/05/23 1624)   lactated ringers     metronidazole 500 mg (08/05/23 2112)     Anti-infectives (From admission, onward)    Start  Dose/Rate Route Frequency Ordered Stop   08/05/23 1200  ciprofloxacin (CIPRO) IVPB 400 mg        400 mg 200 mL/hr over 60 Minutes Intravenous Every 24 hours 08/04/23 1905     08/04/23 2000  metroNIDAZOLE (FLAGYL) IVPB 500 mg        500 mg 100 mL/hr over 60 Minutes Intravenous Every 12 hours 08/04/23 1851     08/04/23 1700  levofloxacin (LEVAQUIN) IVPB 750 mg        750 mg 100 mL/hr over 90 Minutes Intravenous  Once 08/04/23 1647 08/04/23 1830              Family Communication/Anticipated D/C date and plan/Code Status   DVT prophylaxis: SCDs Start: 08/04/23 1853     Code Status: Full Code  Family Communication: None Disposition Plan: Plan to discharge to SNF   Status is: Inpatient Remains  inpatient appropriate because: AKI       Subjective:   Interval events noted.  He complains of general weakness and discomfort from the rectal tube.  Objective:    Vitals:   08/05/23 2116 08/05/23 2317 08/06/23 0358 08/06/23 0746  BP: 91/64 90/62 93/69  98/71  Pulse: (!) 106 98  86  Resp: 17 16 16 18   Temp: 98.5 F (36.9 C) 97.8 F (36.6 C) 97.6 F (36.4 C)   TempSrc:  Axillary    SpO2: 98% 97% 95% 98%  Weight:      Height:       No data found.   Intake/Output Summary (Last 24 hours) at 08/06/2023 0936 Last data filed at 08/06/2023 0400 Gross per 24 hour  Intake 858.88 ml  Output 400 ml  Net 458.88 ml   Filed Weights   08/04/23 1532  Weight: 74.4 kg    Exam:  GEN: NAD SKIN: Abrasion on right cheek. Bruises on chest EYES: No pallor or icterus ENT: MMM CV: RRR PULM: CTA B ABD: soft, ND, NT, +BS CNS: AAO x 2 (person and place), non focal EXT: No edema or tenderness RECTAL: Rectal tube in place      Data Reviewed:   I have personally reviewed following labs and imaging studies:  Labs: Labs show the following:   Basic Metabolic Panel: Recent Labs  Lab 08/04/23 1538 08/04/23 1539 08/05/23 0648 08/06/23 0459  NA 135  --  129* 130*  K 4.1  --  3.9 4.1  CL 101  --  103 103  CO2 17*  --  21* 23  GLUCOSE 117*  --  97 90  BUN 47*  --  47* 36*  CREATININE 4.15*  --  3.12* 1.87*  CALCIUM 7.7*  --  7.5* 7.5*  MG  --  2.6*  --   --    GFR Estimated Creatinine Clearance: 43.4 mL/min (A) (by C-G formula based on SCr of 1.87 mg/dL (H)). Liver Function Tests: Recent Labs  Lab 08/04/23 1538 08/05/23 0648 08/06/23 0459  AST 154* 126* 117*  ALT 111* 95* 92*  ALKPHOS 51 40 39  BILITOT 0.8 0.7 0.6  PROT 5.4* 4.3* 4.2*  ALBUMIN 2.4* 1.8* 1.8*   No results for input(s): "LIPASE", "AMYLASE" in the last 168 hours. No results for input(s): "AMMONIA" in the last 168 hours. Coagulation profile Recent Labs  Lab 08/04/23 1538  INR 1.2     CBC: Recent Labs  Lab 08/04/23 1538 08/04/23 1936 08/05/23 0104 08/05/23 0648 08/06/23 0459  WBC 18.5* 14.6* 13.9* 11.3* 10.4  NEUTROABS 16.2*  --   --   --  8.1*  HGB 14.3 12.4* 12.5* 11.8* 11.1*  HCT 42.3 35.4* 36.0* 33.6* 32.1*  MCV 84.3 82.7 83.3 82.6 83.6  PLT 361 267 257 221 187   Cardiac Enzymes: Recent Labs  Lab 08/04/23 1538 08/05/23 0648 08/06/23 0459  CKTOTAL 2,659* 1,450* 929*   BNP (last 3 results) No results for input(s): "PROBNP" in the last 8760 hours. CBG: Recent Labs  Lab 08/05/23 2054 08/06/23 0805  GLUCAP 109* 83   D-Dimer: No results for input(s): "DDIMER" in the last 72 hours. Hgb A1c: Recent Labs    08/04/23 1538  HGBA1C 4.9   Lipid Profile: Recent Labs    08/04/23 1539  CHOL 88  HDL 39*  LDLCALC 28  TRIG 161  CHOLHDL 2.3   Thyroid function studies: No results for input(s): "TSH", "T4TOTAL", "T3FREE", "THYROIDAB" in the last 72 hours.  Invalid input(s): "FREET3" Anemia work up: No results for input(s): "VITAMINB12", "FOLATE", "FERRITIN", "TIBC", "IRON", "RETICCTPCT" in the last 72 hours. Sepsis Labs: Recent Labs  Lab 08/04/23 1831 08/04/23 1936 08/04/23 2151 08/04/23 2321 08/05/23 0104 08/05/23 0342 08/05/23 0648 08/06/23 0459  PROCALCITON  --   --  1.74  --   --   --   --   --   WBC  --  14.6*  --   --  13.9*  --  11.3* 10.4  LATICACIDVEN 2.4*  --   --  1.9 1.7 1.2  --   --     Microbiology Recent Results (from the past 240 hour(s))  Culture, blood (Routine x 2)     Status: None (Preliminary result)   Collection Time: 08/04/23  3:39 PM   Specimen: BLOOD  Result Value Ref Range Status   Specimen Description BLOOD BLOOD RIGHT HAND  Final   Special Requests   Final    BOTTLES DRAWN AEROBIC AND ANAEROBIC Blood Culture adequate volume   Culture   Final    NO GROWTH 2 DAYS Performed at [facility], [address] [place], [place] [address]    Report Status PENDING  Incomplete  Culture, blood  (Routine x 2)     Status: None (Preliminary result)   Collection Time: 08/04/23  3:40 PM   Specimen: BLOOD  Result Value Ref Range Status   Specimen Description BLOOD RIGHT ANTECUBITAL  Final   Special Requests   Final    BOTTLES DRAWN AEROBIC AND ANAEROBIC Blood Culture adequate volume   Culture  Setup Time   Final    GRAM POSITIVE COCCI ANAEROBIC BOTTLE ONLY Organism ID to follow CRITICAL RESULT CALLED TO, READ BACK BY AND VERIFIED WITH: CAROLINE COULTER 1425 08/05/23 MU Performed at [facility], [address] [place], [place] [address]    Culture GRAM POSITIVE COCCI  Final   Report Status PENDING  Incomplete  Blood Culture ID Panel (Reflexed)     Status: Abnormal   Collection Time: 08/04/23  3:40 PM  Result Value Ref Range Status   Enterococcus faecalis NOT DETECTED NOT DETECTED Final   Enterococcus Faecium NOT DETECTED NOT DETECTED Final   Listeria monocytogenes NOT DETECTED NOT DETECTED Final   Staphylococcus species DETECTED (A) NOT DETECTED Final    Comment: CRITICAL RESULT CALLED TO, READ BACK BY AND VERIFIED WITH: CAROLINE COULTER 1424 08/05/23 MU    Staphylococcus aureus (BCID) NOT DETECTED NOT DETECTED Final   Staphylococcus epidermidis DETECTED (A) NOT DETECTED Final    Comment: CRITICAL RESULT CALLED TO, READ BACK BY AND VERIFIED WITH: CAROLINE COULTER 1424 08/05/23  MU    Staphylococcus lugdunensis NOT DETECTED NOT DETECTED Final   Streptococcus species NOT DETECTED NOT DETECTED Final   Streptococcus agalactiae NOT DETECTED NOT DETECTED Final   Streptococcus pneumoniae NOT DETECTED NOT DETECTED Final   Streptococcus pyogenes NOT DETECTED NOT DETECTED Final   A.calcoaceticus-baumannii NOT DETECTED NOT DETECTED Final   Bacteroides fragilis NOT DETECTED NOT DETECTED Final   Enterobacterales NOT DETECTED NOT DETECTED Final   Enterobacter cloacae complex NOT DETECTED NOT DETECTED Final   Escherichia coli NOT DETECTED NOT DETECTED Final   Klebsiella aerogenes  NOT DETECTED NOT DETECTED Final   Klebsiella oxytoca NOT DETECTED NOT DETECTED Final   Klebsiella pneumoniae NOT DETECTED NOT DETECTED Final   Proteus species NOT DETECTED NOT DETECTED Final   Salmonella species NOT DETECTED NOT DETECTED Final   Serratia marcescens NOT DETECTED NOT DETECTED Final   Haemophilus influenzae NOT DETECTED NOT DETECTED Final   Neisseria meningitidis NOT DETECTED NOT DETECTED Final   Pseudomonas aeruginosa NOT DETECTED NOT DETECTED Final   Stenotrophomonas maltophilia NOT DETECTED NOT DETECTED Final   Candida albicans NOT DETECTED NOT DETECTED Final   Candida auris NOT DETECTED NOT DETECTED Final   Candida glabrata NOT DETECTED NOT DETECTED Final   Candida krusei NOT DETECTED NOT DETECTED Final   Candida parapsilosis NOT DETECTED NOT DETECTED Final   Candida tropicalis NOT DETECTED NOT DETECTED Final   Cryptococcus neoformans/gattii NOT DETECTED NOT DETECTED Final   Methicillin resistance mecA/C NOT DETECTED NOT DETECTED Final    Comment: Performed at [facility], [address] [place], [place] [address]  Urine Culture     Status: None   Collection Time: 08/04/23  6:31 PM   Specimen: Urine, Random  Result Value Ref Range Status   Specimen Description   Final    URINE, RANDOM Performed at [facility] [address], [place], [place] [address]    Special Requests   Final    NONE Reflexed from [id number] Performed at [facility], [address] [place], [place] [address]    Culture   Final    NO GROWTH Performed at [facility], [address], [place], [place] [address]    Report Status 08/06/2023 FINAL  Final  C Difficile Quick Screen w PCR reflex     Status: None   Collection Time: 08/05/23  2:10 AM   Specimen: STOOL  Result Value Ref Range Status   C Diff antigen NEGATIVE NEGATIVE Final   C Diff toxin NEGATIVE NEGATIVE Final   C Diff interpretation No C. difficile detected.  Final    Comment: Performed at  [facility], [address] [place], [place] [address]  Gastrointestinal Panel by PCR , Stool     Status: None   Collection Time: 08/05/23  9:30 PM   Specimen: Rectum; Stool  Result Value Ref Range Status   Campylobacter species NOT DETECTED NOT DETECTED Final   Plesimonas shigelloides NOT DETECTED NOT DETECTED Final   Salmonella species NOT DETECTED NOT DETECTED Final   Yersinia enterocolitica NOT DETECTED NOT DETECTED Final   Vibrio species NOT DETECTED NOT DETECTED Final   Vibrio cholerae NOT DETECTED NOT DETECTED Final   Enteroaggregative E coli (EAEC) NOT DETECTED NOT DETECTED Final   Enteropathogenic E coli (EPEC) NOT DETECTED NOT DETECTED Final   Enterotoxigenic E coli (ETEC) NOT DETECTED NOT DETECTED Final   Shiga like toxin producing E coli (STEC) NOT DETECTED NOT DETECTED Final   Shigella/Enteroinvasive E coli (EIEC) NOT  DETECTED NOT DETECTED Final   Cryptosporidium NOT DETECTED NOT DETECTED Final   Cyclospora cayetanensis NOT DETECTED NOT DETECTED Final   Entamoeba histolytica NOT DETECTED NOT DETECTED Final   Giardia lamblia NOT DETECTED NOT DETECTED Final   Adenovirus F40/41 NOT DETECTED NOT DETECTED Final   Astrovirus NOT DETECTED NOT DETECTED Final   Norovirus GI/GII NOT DETECTED NOT DETECTED Final   Rotavirus A NOT DETECTED NOT DETECTED Final   Sapovirus (I, II, IV, and V) NOT DETECTED NOT DETECTED Final    Comment: Performed at [facility], [address], [place], [place] [address]    Procedures and diagnostic studies:  MR BRAIN WO CONTRAST  Result Date: 08/04/2023 CLINICAL DATA:  Acute neurologic deficit. Bilateral lower extremity weakness EXAM: MRI HEAD WITHOUT CONTRAST TECHNIQUE: Multiplanar, multiecho pulse sequences of the brain and surrounding structures were obtained without intravenous contrast. COMPARISON:  08/22/2014 FINDINGS: Brain: No acute infarct, mass effect or extra-axial collection. No acute or chronic hemorrhage. There is  confluent hyperintense T2-weighted signal within the white matter. Generalized volume loss. 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 The midline structures are normal. Vascular: Major flow voids are preserved. Skull and upper cervical spine: Normal calvarium and skull base. Visualized upper cervical spine and soft tissues are normal. Sinuses/Orbits:No paranasal sinus fluid levels or advanced mucosal thickening. No mastoid or middle ear effusion. Normal orbits. IMPRESSION: 1. No acute intracranial abnormality. 2. 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 3. 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 Electronically Signed   By: Deatra Robinson M.D.   On: 08/04/2023 22:50   CT CHEST ABDOMEN PELVIS WO CONTRAST  Result Date: 08/04/2023 CLINICAL DATA:  Patient was found down, potentially for 3 days. EXAM: CT CHEST, ABDOMEN AND PELVIS WITHOUT CONTRAST TECHNIQUE: Multidetector CT imaging of the chest, abdomen and pelvis was performed following the standard protocol without IV contrast. RADIATION DOSE REDUCTION: This exam was performed according to the departmental dose-optimization program which includes automated exposure control, adjustment of the mA and/or kV according to patient size and/or use of iterative reconstruction technique. COMPARISON:  Abdomen and pelvis CT 11/22/2015 FINDINGS: CT CHEST FINDINGS Cardiovascular: The heart size is normal. No substantial pericardial effusion. Mild atherosclerotic calcification is noted in the wall of the thoracic aorta. Mediastinum/Nodes: No mediastinal lymphadenopathy. 9 mm posterior right thyroid nodule. Not clinically significant; no follow-up imaging recommended. No evidence for gross hilar lymphadenopathy although assessment is limited by the lack of intravenous contrast on the current study. The esophagus has normal imaging features. There is no axillary lymphadenopathy. (ref: J  Am Coll Radiol. 2015 Feb;12(2): 143-50). Lungs/Pleura: Centrilobular and paraseptal emphysema evident. 3 mm posterior right lower lobe nodule identified on 146/4. 2 mm left upper lobe nodule identified on 60/4. No focal airspace consolidation. No pleural effusion. Musculoskeletal: No worrisome lytic or sclerotic osseous abnormality. CT ABDOMEN PELVIS FINDINGS Hepatobiliary: No suspicious focal abnormality in the liver on this study without intravenous contrast. Increased attenuation in the gallbladder lumen may reflect sludge. No intrahepatic or extrahepatic biliary dilation. Pancreas: No focal mass lesion. No dilatation of the main duct. No intraparenchymal cyst. No peripancreatic edema. Spleen: No splenomegaly. No suspicious focal mass lesion. Adrenals/Urinary Tract: No adrenal nodule or mass. Right kidney unremarkable. 1 mm nonobstructing stone identified interpolar left kidney with another punctate stone in the lower pole left kidney. No evidence for hydroureter. The urinary bladder appears normal for the degree of distention. Stomach/Bowel: Stomach is unremarkable. No  gastric wall thickening. No evidence of outlet obstruction. Duodenum is normally positioned as is the ligament of Treitz. No small bowel wall thickening. No small bowel dilatation. The terminal ileum is normal. The appendix is not well visualized, but there is no edema or inflammation in the region of the cecal tip to suggest appendicitis. No gross colonic mass. No colonic wall thickening. Vascular/Lymphatic: There is mild atherosclerotic calcification of the abdominal aorta without aneurysm. There is no gastrohepatic or hepatoduodenal ligament lymphadenopathy. No retroperitoneal or mesenteric lymphadenopathy. Left-sided IVC noted (normal variant). No pelvic sidewall lymphadenopathy. Reproductive: The prostate gland and seminal vesicles are unremarkable. Other: No intraperitoneal free fluid. Musculoskeletal: No worrisome lytic or sclerotic osseous  abnormality. IMPRESSION: 1. No acute findings in the chest, abdomen, or pelvis. Specifically, no acute findings in the chest, abdomen, or pelvis 2. Tiny bilateral pulmonary nodules measuring up to 3 mm. No follow-up needed if patient is low-risk (and has no known or suspected primary neoplasm). Non-contrast chest CT can be considered in 12 months if patient is high-risk. This recommendation follows the consensus statement: Guidelines for Management of Incidental Pulmonary Nodules Detected on CT Images: From the Fleischner Society 2017; Radiology 2017; 284:228-243. 3. Aortic Atherosclerosis (ICD10-I70.0) and Emphysema (ICD10-J43.9). Electronically Signed   By: Kennith Center M.D.   On: 08/04/2023 18:08   DG Knee 1-2 Views Left  Result Date: 08/04/2023 CLINICAL DATA:  Unwitnessed fall.  Left knee bruises. EXAM: LEFT KNEE - 1-2 VIEW COMPARISON:  None Available. FINDINGS: No evidence of fracture, dislocation, or joint effusion. No evidence of arthropathy or other focal bone abnormality. Soft tissues are unremarkable. IMPRESSION: Negative. Electronically Signed   By: Lupita Raider M.D.   On: 08/04/2023 17:41   CT Head Wo Contrast  Result Date: 08/04/2023 CLINICAL DATA:  Neck trauma, intoxicated or obtunded (Age >= 16y); Mental status change, unknown cause. EXAM: CT HEAD WITHOUT CONTRAST CT CERVICAL SPINE WITHOUT CONTRAST TECHNIQUE: Multidetector CT imaging of the head and cervical spine was performed following the standard protocol without intravenous contrast. Multiplanar CT image reconstructions of the cervical spine were also generated. RADIATION DOSE REDUCTION: This exam was performed according to the departmental dose-optimization program which includes automated exposure control, adjustment of the mA and/or kV according to patient size and/or use of iterative reconstruction technique. COMPARISON:  CT head and cervical spine 08/20/2014. FINDINGS: CT HEAD FINDINGS Brain: Interval extension of  encephalomalacia in the bilateral PCA territories with new encephalomalacia in the posterior right frontal lobe, consistent with chronic infarct. Unchanged small area of old infarct in the left middle frontal gyrus. Old lacunar infarcts in the bilateral cerebellar hemispheres no acute hemorrhage, hydrocephalus, extra-axial collection, mass effect or midline shift. Vascular: No hyperdense vessel or unexpected calcification. Skull: No calvarial fracture or suspicious bone lesion. Skull base is unremarkable. Sinuses/Orbits: No acute finding. Other: None. CT CERVICAL SPINE FINDINGS Alignment: Normal. Skull base and vertebrae: No acute fracture. Normal craniocervical junction. No suspicious bone lesions. Soft tissues and spinal canal: No prevertebral fluid or swelling. No visible canal hematoma. Disc levels: Mild cervical spondylosis without high-grade spinal canal stenosis. Upper chest: No acute findings. Other: None. IMPRESSION: 1. No acute intracranial abnormality. 2. Interval extension of encephalomalacia in the bilateral PCA territories with new encephalomalacia in the posterior right frontal lobe, consistent with chronic infarct. 3. No acute cervical spine fracture or traumatic listhesis. Electronically Signed   By: Orvan Falconer M.D.   On: 08/04/2023 17:40   CT Cervical Spine Wo Contrast  Result Date: 08/04/2023  CLINICAL DATA:  Neck trauma, intoxicated or obtunded (Age >= 16y); Mental status change, unknown cause. EXAM: CT HEAD WITHOUT CONTRAST CT CERVICAL SPINE WITHOUT CONTRAST TECHNIQUE: Multidetector CT imaging of the head and cervical spine was performed following the standard protocol without intravenous contrast. Multiplanar CT image reconstructions of the cervical spine were also generated. RADIATION DOSE REDUCTION: This exam was performed according to the departmental dose-optimization program which includes automated exposure control, adjustment of the mA and/or kV according to patient size and/or  use of iterative reconstruction technique. COMPARISON:  CT head and cervical spine 08/20/2014. FINDINGS: CT HEAD FINDINGS Brain: Interval extension of encephalomalacia in the bilateral PCA territories with new encephalomalacia in the posterior right frontal lobe, consistent with chronic infarct. Unchanged small area of old infarct in the left middle frontal gyrus. Old lacunar infarcts in the bilateral cerebellar hemispheres no acute hemorrhage, hydrocephalus, extra-axial collection, mass effect or midline shift. Vascular: No hyperdense vessel or unexpected calcification. Skull: No calvarial fracture or suspicious bone lesion. Skull base is unremarkable. Sinuses/Orbits: No acute finding. Other: None. CT CERVICAL SPINE FINDINGS Alignment: Normal. Skull base and vertebrae: No acute fracture. Normal craniocervical junction. No suspicious bone lesions. Soft tissues and spinal canal: No prevertebral fluid or swelling. No visible canal hematoma. Disc levels: Mild cervical spondylosis without high-grade spinal canal stenosis. Upper chest: No acute findings. Other: None. IMPRESSION: 1. No acute intracranial abnormality. 2. Interval extension of encephalomalacia in the bilateral PCA territories with new encephalomalacia in the posterior right frontal lobe, consistent with chronic infarct. 3. No acute cervical spine fracture or traumatic listhesis. Electronically Signed   By: Orvan Falconer M.D.   On: 08/04/2023 17:40   DG Forearm Right  Result Date: 08/04/2023 CLINICAL DATA:  Unwitnessed fall.  Bruising of right arm. EXAM: RIGHT FOREARM - 2 VIEW COMPARISON:  None Available. FINDINGS: There is no evidence of fracture or other focal bone lesions. Soft tissues are unremarkable. IMPRESSION: Negative. Electronically Signed   By: Lupita Raider M.D.   On: 08/04/2023 17:39   DG Humerus Left  Result Date: 08/04/2023 CLINICAL DATA:  Status post fall with multiple bruises to the upper and lower extremities EXAM: LEFT HUMERUS  - 2 VIEW; LEFT FOREARM - 2 VIEW COMPARISON:  None Available. FINDINGS: There is no evidence of fracture or other focal bone lesions. Soft tissues are unremarkable. IMPRESSION: No acute fracture or dislocation. Electronically Signed   By: Agustin Cree M.D.   On: 08/04/2023 17:38   DG Forearm Left  Result Date: 08/04/2023 CLINICAL DATA:  Status post fall with multiple bruises to the upper and lower extremities EXAM: LEFT HUMERUS - 2 VIEW; LEFT FOREARM - 2 VIEW COMPARISON:  None Available. FINDINGS: There is no evidence of fracture or other focal bone lesions. Soft tissues are unremarkable. IMPRESSION: No acute fracture or dislocation. Electronically Signed   By: Agustin Cree M.D.   On: 08/04/2023 17:38   DG Tibia/Fibula Left  Result Date: 08/04/2023 CLINICAL DATA:  Unwitnessed fall.  Bruising of left leg. EXAM: LEFT TIBIA AND FIBULA - 2 VIEW COMPARISON:  None Available. FINDINGS: There is no evidence of fracture or other focal bone lesions. Soft tissues are unremarkable. IMPRESSION: Negative. Electronically Signed   By: Lupita Raider M.D.   On: 08/04/2023 17:38   DG Tibia/Fibula Right  Result Date: 08/04/2023 CLINICAL DATA:  Multifocal bruises and pressure type wounds status post fall. GI bleed. EXAM: RIGHT KNEE - 1-2 VIEW; RIGHT TIBIA AND FIBULA - 2 VIEW COMPARISON:  None Available. FINDINGS: The mineralization and alignment are normal. There is no evidence of acute fracture or dislocation. The joint spaces appear preserved at the ankle and knee. No evidence of joint effusion, foreign body or soft tissue emphysema. IMPRESSION: No acute osseous findings or significant soft tissue abnormalities identified within the right knee or lower leg. Electronically Signed   By: Carey Bullocks M.D.   On: 08/04/2023 17:35   DG Knee 1-2 Views Right  Result Date: 08/04/2023 CLINICAL DATA:  Multifocal bruises and pressure type wounds status post fall. GI bleed. EXAM: RIGHT KNEE - 1-2 VIEW; RIGHT TIBIA AND FIBULA - 2 VIEW  COMPARISON:  None Available. FINDINGS: The mineralization and alignment are normal. There is no evidence of acute fracture or dislocation. The joint spaces appear preserved at the ankle and knee. No evidence of joint effusion, foreign body or soft tissue emphysema. IMPRESSION: No acute osseous findings or significant soft tissue abnormalities identified within the right knee or lower leg. Electronically Signed   By: Carey Bullocks M.D.   On: 08/04/2023 17:35   DG Humerus Right  Result Date: 08/04/2023 CLINICAL DATA:  Larey Seat, bruising EXAM: RIGHT HUMERUS - 2+ VIEW COMPARISON:  12/17/2013 FINDINGS: Frontal and lateral views of the right humerus are obtained. No acute displaced fracture. Alignment of the right shoulder and elbow is anatomic. Mild acromioclavicular and glenohumeral joint osteoarthritis. Soft tissues are unremarkable. IMPRESSION: 1. 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 2. No acute fracture. Electronically Signed   By: Sharlet Salina M.D.   On: 08/04/2023 17:32   DG Chest Port 1 View  Result Date: 08/04/2023 CLINICAL DATA:  Short of breath, gastrointestinal bleeding EXAM: PORTABLE CHEST 1 VIEW COMPARISON:  08/20/2014 FINDINGS: 2 frontal views of the chest demonstrate an unremarkable cardiac silhouette. No acute airspace disease, effusion, or pneumothorax. No acute bony abnormality. IMPRESSION: 1. No acute intrathoracic process. Electronically Signed   By: Sharlet Salina M.D.   On: 08/04/2023 16:36               LOS: 2 days   [name]  [facility]   Pager on [email or web address]. If 7PM-7AM, please contact night-coverage at [email or web address]     08/06/2023, 9:36 AM

## 2023-08-06 NOTE — Progress Notes (Signed)
Mathew Minium, MD [facility]   [address] [place], [place] [address] Phone: [phone] Fax : [phone]   Subjective: The patient reports that he did not sleep at all last night due to the rectal tube being placed.  The patient is being treated for sepsis.  He has chronic ulcerative colitis which has been untreated for some time.  The patient has had significant weight loss and diarrhea with recent bloody diarrhea.   Objective: Vital signs in last 24 hours: Vitals:   08/05/23 2116 08/05/23 2317 08/06/23 0358 08/06/23 0746  BP: 91/64 90/62 93/69  98/71  Pulse: (!) 106 98  86  Resp: 17 16 16 18   Temp: 98.5 F (36.9 C) 97.8 F (36.6 C) 97.6 F (36.4 C)   TempSrc:  Axillary    SpO2: 98% 97% 95% 98%  Weight:      Height:       Weight change:   Intake/Output Summary (Last 24 hours) at 08/06/2023 1308 Last data filed at 08/06/2023 0400 Gross per 24 hour  Intake 858.88 ml  Output 400 ml  Net 458.88 ml     Exam: Heart:: Regular rate and rhythm, S1S2 present, or without murmur or extra heart sounds Lungs: normal and clear to auscultation and percussion Abdomen: soft, nontender, normal bowel sounds   Lab Results: @LABTEST2 @ Micro Results: Recent Results (from the past 240 hour(s))  Culture, blood (Routine x 2)     Status: None (Preliminary result)   Collection Time: 08/04/23  3:39 PM   Specimen: BLOOD  Result Value Ref Range Status   Specimen Description BLOOD BLOOD RIGHT HAND  Final   Special Requests   Final    BOTTLES DRAWN AEROBIC AND ANAEROBIC Blood Culture adequate volume   Culture   Final    NO GROWTH 2 DAYS Performed at [facility], [address] [place], [place] [address]    Report Status PENDING  Incomplete  Culture, blood (Routine x 2)     Status: None (Preliminary result)   Collection Time: 08/04/23  3:40 PM   Specimen: BLOOD  Result Value Ref Range Status   Specimen Description BLOOD RIGHT ANTECUBITAL  Final   Special Requests    Final    BOTTLES DRAWN AEROBIC AND ANAEROBIC Blood Culture adequate volume   Culture  Setup Time   Final    GRAM POSITIVE COCCI ANAEROBIC BOTTLE ONLY Organism ID to follow CRITICAL RESULT CALLED TO, READ BACK BY AND VERIFIED WITH: CAROLINE COULTER 1425 08/05/23 MU Performed at [facility], [address] [place], [place] [address]    Culture GRAM POSITIVE COCCI  Final   Report Status PENDING  Incomplete  Blood Culture ID Panel (Reflexed)     Status: Abnormal   Collection Time: 08/04/23  3:40 PM  Result Value Ref Range Status   Enterococcus faecalis NOT DETECTED NOT DETECTED Final   Enterococcus Faecium NOT DETECTED NOT DETECTED Final   Listeria monocytogenes NOT DETECTED NOT DETECTED Final   Staphylococcus species DETECTED (A) NOT DETECTED Final    Comment: CRITICAL RESULT CALLED TO, READ BACK BY AND VERIFIED WITH: CAROLINE COULTER 1424 08/05/23 MU    Staphylococcus aureus (BCID) NOT DETECTED NOT DETECTED Final   Staphylococcus epidermidis DETECTED (A) NOT DETECTED Final    Comment: CRITICAL RESULT CALLED TO, READ BACK BY AND VERIFIED WITH: CAROLINE COULTER 1424 08/05/23 MU    Staphylococcus lugdunensis NOT DETECTED NOT DETECTED Final   Streptococcus species NOT DETECTED NOT DETECTED Final  Streptococcus agalactiae NOT DETECTED NOT DETECTED Final   Streptococcus pneumoniae NOT DETECTED NOT DETECTED Final   Streptococcus pyogenes NOT DETECTED NOT DETECTED Final   A.calcoaceticus-baumannii NOT DETECTED NOT DETECTED Final   Bacteroides fragilis NOT DETECTED NOT DETECTED Final   Enterobacterales NOT DETECTED NOT DETECTED Final   Enterobacter cloacae complex NOT DETECTED NOT DETECTED Final   Escherichia coli NOT DETECTED NOT DETECTED Final   Klebsiella aerogenes NOT DETECTED NOT DETECTED Final   Klebsiella oxytoca NOT DETECTED NOT DETECTED Final   Klebsiella pneumoniae NOT DETECTED NOT DETECTED Final   Proteus species NOT DETECTED NOT DETECTED Final   Salmonella species  NOT DETECTED NOT DETECTED Final   Serratia marcescens NOT DETECTED NOT DETECTED Final   Haemophilus influenzae NOT DETECTED NOT DETECTED Final   Neisseria meningitidis NOT DETECTED NOT DETECTED Final   Pseudomonas aeruginosa NOT DETECTED NOT DETECTED Final   Stenotrophomonas maltophilia NOT DETECTED NOT DETECTED Final   Candida albicans NOT DETECTED NOT DETECTED Final   Candida auris NOT DETECTED NOT DETECTED Final   Candida glabrata NOT DETECTED NOT DETECTED Final   Candida krusei NOT DETECTED NOT DETECTED Final   Candida parapsilosis NOT DETECTED NOT DETECTED Final   Candida tropicalis NOT DETECTED NOT DETECTED Final   Cryptococcus neoformans/gattii NOT DETECTED NOT DETECTED Final   Methicillin resistance mecA/C NOT DETECTED NOT DETECTED Final    Comment: Performed at [facility], [address] [place], [place] [address]  Urine Culture     Status: None   Collection Time: 08/04/23  6:31 PM   Specimen: Urine, Random  Result Value Ref Range Status   Specimen Description   Final    URINE, RANDOM Performed at [facility], [address] [place], [place] [address]    Special Requests   Final    NONE Reflexed from [id number] Performed at [facility], [address], [place], [place] [address]    Culture   Final    NO GROWTH Performed at [facility], [address] [place], [place] [address]    Report Status 08/06/2023 FINAL  Final  C Difficile Quick Screen w PCR reflex     Status: None   Collection Time: 08/05/23  2:10 AM   Specimen: STOOL  Result Value Ref Range Status   C Diff antigen NEGATIVE NEGATIVE Final   C Diff toxin NEGATIVE NEGATIVE Final   C Diff interpretation No C. difficile detected.  Final    Comment: Performed at [facility], [address] [place], [place] [address]  Gastrointestinal Panel by PCR , Stool     Status: None   Collection Time: 08/05/23  9:30 PM   Specimen: Rectum; Stool  Result Value Ref Range  Status   Campylobacter species NOT DETECTED NOT DETECTED Final   Plesimonas shigelloides NOT DETECTED NOT DETECTED Final   Salmonella species NOT DETECTED NOT DETECTED Final   Yersinia enterocolitica NOT DETECTED NOT DETECTED Final   Vibrio species NOT DETECTED NOT DETECTED Final   Vibrio cholerae NOT DETECTED NOT DETECTED Final   Enteroaggregative E coli (EAEC) NOT DETECTED NOT DETECTED Final   Enteropathogenic E coli (EPEC) NOT DETECTED NOT DETECTED Final   Enterotoxigenic E coli (ETEC) NOT DETECTED NOT DETECTED Final   Shiga like toxin producing E coli (STEC) NOT DETECTED NOT DETECTED Final   Shigella/Enteroinvasive E coli (EIEC) NOT DETECTED NOT DETECTED Final   Cryptosporidium NOT DETECTED NOT DETECTED Final   Cyclospora cayetanensis NOT DETECTED NOT DETECTED Final  Entamoeba histolytica NOT DETECTED NOT DETECTED Final   Giardia lamblia NOT DETECTED NOT DETECTED Final   Adenovirus F40/41 NOT DETECTED NOT DETECTED Final   Astrovirus NOT DETECTED NOT DETECTED Final   Norovirus GI/GII NOT DETECTED NOT DETECTED Final   Rotavirus A NOT DETECTED NOT DETECTED Final   Sapovirus (I, II, IV, and V) NOT DETECTED NOT DETECTED Final    Comment: Performed at [facility], [address] [place], [place] [address]   Studies/Results: MR BRAIN WO CONTRAST  Result Date: 08/04/2023 CLINICAL DATA:  Acute neurologic deficit. Bilateral lower extremity weakness EXAM: MRI HEAD WITHOUT CONTRAST TECHNIQUE: Multiplanar, multiecho pulse sequences of the brain and surrounding structures were obtained without intravenous contrast. COMPARISON:  08/22/2014 FINDINGS: Brain: No acute infarct, mass effect or extra-axial collection. No acute or chronic hemorrhage. There is confluent hyperintense T2-weighted signal within the white matter. Generalized volume loss. 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 The midline structures are normal.  Vascular: Major flow voids are preserved. Skull and upper cervical spine: Normal calvarium and skull base. Visualized upper cervical spine and soft tissues are normal. Sinuses/Orbits:No paranasal sinus fluid levels or advanced mucosal thickening. No mastoid or middle ear effusion. Normal orbits. IMPRESSION: 1. No acute intracranial abnormality. 2. 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 3. 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 Electronically Signed   By: Deatra Robinson M.D.   On: 08/04/2023 22:50   CT CHEST ABDOMEN PELVIS WO CONTRAST  Result Date: 08/04/2023 CLINICAL DATA:  Patient was found down, potentially for 3 days. EXAM: CT CHEST, ABDOMEN AND PELVIS WITHOUT CONTRAST TECHNIQUE: Multidetector CT imaging of the chest, abdomen and pelvis was performed following the standard protocol without IV contrast. RADIATION DOSE REDUCTION: This exam was performed according to the departmental dose-optimization program which includes automated exposure control, adjustment of the mA and/or kV according to patient size and/or use of iterative reconstruction technique. COMPARISON:  Abdomen and pelvis CT 11/22/2015 FINDINGS: CT CHEST FINDINGS Cardiovascular: The heart size is normal. No substantial pericardial effusion. Mild atherosclerotic calcification is noted in the wall of the thoracic aorta. Mediastinum/Nodes: No mediastinal lymphadenopathy. 9 mm posterior right thyroid nodule. Not clinically significant; no follow-up imaging recommended. No evidence for gross hilar lymphadenopathy although assessment is limited by the lack of intravenous contrast on the current study. The esophagus has normal imaging features. There is no axillary lymphadenopathy. (ref: J Am Coll Radiol. 2015 Feb;12(2): 143-50). Lungs/Pleura: Centrilobular and paraseptal emphysema evident. 3 mm posterior right lower lobe nodule identified on 146/4. 2 mm left upper lobe nodule identified on 60/4. No focal airspace  consolidation. No pleural effusion. Musculoskeletal: No worrisome lytic or sclerotic osseous abnormality. CT ABDOMEN PELVIS FINDINGS Hepatobiliary: No suspicious focal abnormality in the liver on this study without intravenous contrast. Increased attenuation in the gallbladder lumen may reflect sludge. No intrahepatic or extrahepatic biliary dilation. Pancreas: No focal mass lesion. No dilatation of the main duct. No intraparenchymal cyst. No peripancreatic edema. Spleen: No splenomegaly. No suspicious focal mass lesion. Adrenals/Urinary Tract: No adrenal nodule or mass. Right kidney unremarkable. 1 mm nonobstructing stone identified interpolar left kidney with another punctate stone in the lower pole left kidney. No evidence for hydroureter. The urinary bladder appears normal for the degree of distention. Stomach/Bowel: Stomach is unremarkable. No gastric wall thickening. No evidence of outlet obstruction. Duodenum is normally positioned as is the ligament of Treitz. No small bowel wall thickening. No small bowel dilatation. The  terminal ileum is normal. The appendix is not well visualized, but there is no edema or inflammation in the region of the cecal tip to suggest appendicitis. No gross colonic mass. No colonic wall thickening. Vascular/Lymphatic: There is mild atherosclerotic calcification of the abdominal aorta without aneurysm. There is no gastrohepatic or hepatoduodenal ligament lymphadenopathy. No retroperitoneal or mesenteric lymphadenopathy. Left-sided IVC noted (normal variant). No pelvic sidewall lymphadenopathy. Reproductive: The prostate gland and seminal vesicles are unremarkable. Other: No intraperitoneal free fluid. Musculoskeletal: No worrisome lytic or sclerotic osseous abnormality. IMPRESSION: 1. No acute findings in the chest, abdomen, or pelvis. Specifically, no acute findings in the chest, abdomen, or pelvis 2. Tiny bilateral pulmonary nodules measuring up to 3 mm. No follow-up needed if  patient is low-risk (and has no known or suspected primary neoplasm). Non-contrast chest CT can be considered in 12 months if patient is high-risk. This recommendation follows the consensus statement: Guidelines for Management of Incidental Pulmonary Nodules Detected on CT Images: From the Fleischner Society 2017; Radiology 2017; 284:228-243. 3. Aortic Atherosclerosis (ICD10-I70.0) and Emphysema (ICD10-J43.9). Electronically Signed   By: Kennith Center M.D.   On: 08/04/2023 18:08   DG Knee 1-2 Views Left  Result Date: 08/04/2023 CLINICAL DATA:  Unwitnessed fall.  Left knee bruises. EXAM: LEFT KNEE - 1-2 VIEW COMPARISON:  None Available. FINDINGS: No evidence of fracture, dislocation, or joint effusion. No evidence of arthropathy or other focal bone abnormality. Soft tissues are unremarkable. IMPRESSION: Negative. Electronically Signed   By: Lupita Raider M.D.   On: 08/04/2023 17:41   CT Head Wo Contrast  Result Date: 08/04/2023 CLINICAL DATA:  Neck trauma, intoxicated or obtunded (Age >= 16y); Mental status change, unknown cause. EXAM: CT HEAD WITHOUT CONTRAST CT CERVICAL SPINE WITHOUT CONTRAST TECHNIQUE: Multidetector CT imaging of the head and cervical spine was performed following the standard protocol without intravenous contrast. Multiplanar CT image reconstructions of the cervical spine were also generated. RADIATION DOSE REDUCTION: This exam was performed according to the departmental dose-optimization program which includes automated exposure control, adjustment of the mA and/or kV according to patient size and/or use of iterative reconstruction technique. COMPARISON:  CT head and cervical spine 08/20/2014. FINDINGS: CT HEAD FINDINGS Brain: Interval extension of encephalomalacia in the bilateral PCA territories with new encephalomalacia in the posterior right frontal lobe, consistent with chronic infarct. Unchanged small area of old infarct in the left middle frontal gyrus. Old lacunar infarcts in  the bilateral cerebellar hemispheres no acute hemorrhage, hydrocephalus, extra-axial collection, mass effect or midline shift. Vascular: No hyperdense vessel or unexpected calcification. Skull: No calvarial fracture or suspicious bone lesion. Skull base is unremarkable. Sinuses/Orbits: No acute finding. Other: None. CT CERVICAL SPINE FINDINGS Alignment: Normal. Skull base and vertebrae: No acute fracture. Normal craniocervical junction. No suspicious bone lesions. Soft tissues and spinal canal: No prevertebral fluid or swelling. No visible canal hematoma. Disc levels: Mild cervical spondylosis without high-grade spinal canal stenosis. Upper chest: No acute findings. Other: None. IMPRESSION: 1. No acute intracranial abnormality. 2. Interval extension of encephalomalacia in the bilateral PCA territories with new encephalomalacia in the posterior right frontal lobe, consistent with chronic infarct. 3. No acute cervical spine fracture or traumatic listhesis. Electronically Signed   By: Orvan Falconer M.D.   On: 08/04/2023 17:40   CT Cervical Spine Wo Contrast  Result Date: 08/04/2023 CLINICAL DATA:  Neck trauma, intoxicated or obtunded (Age >= 16y); Mental status change, unknown cause. EXAM: CT HEAD WITHOUT CONTRAST CT CERVICAL SPINE WITHOUT CONTRAST TECHNIQUE: Multidetector  CT imaging of the head and cervical spine was performed following the standard protocol without intravenous contrast. Multiplanar CT image reconstructions of the cervical spine were also generated. RADIATION DOSE REDUCTION: This exam was performed according to the departmental dose-optimization program which includes automated exposure control, adjustment of the mA and/or kV according to patient size and/or use of iterative reconstruction technique. COMPARISON:  CT head and cervical spine 08/20/2014. FINDINGS: CT HEAD FINDINGS Brain: Interval extension of encephalomalacia in the bilateral PCA territories with new encephalomalacia in the  posterior right frontal lobe, consistent with chronic infarct. Unchanged small area of old infarct in the left middle frontal gyrus. Old lacunar infarcts in the bilateral cerebellar hemispheres no acute hemorrhage, hydrocephalus, extra-axial collection, mass effect or midline shift. Vascular: No hyperdense vessel or unexpected calcification. Skull: No calvarial fracture or suspicious bone lesion. Skull base is unremarkable. Sinuses/Orbits: No acute finding. Other: None. CT CERVICAL SPINE FINDINGS Alignment: Normal. Skull base and vertebrae: No acute fracture. Normal craniocervical junction. No suspicious bone lesions. Soft tissues and spinal canal: No prevertebral fluid or swelling. No visible canal hematoma. Disc levels: Mild cervical spondylosis without high-grade spinal canal stenosis. Upper chest: No acute findings. Other: None. IMPRESSION: 1. No acute intracranial abnormality. 2. Interval extension of encephalomalacia in the bilateral PCA territories with new encephalomalacia in the posterior right frontal lobe, consistent with chronic infarct. 3. No acute cervical spine fracture or traumatic listhesis. Electronically Signed   By: Orvan Falconer M.D.   On: 08/04/2023 17:40   DG Forearm Right  Result Date: 08/04/2023 CLINICAL DATA:  Unwitnessed fall.  Bruising of right arm. EXAM: RIGHT FOREARM - 2 VIEW COMPARISON:  None Available. FINDINGS: There is no evidence of fracture or other focal bone lesions. Soft tissues are unremarkable. IMPRESSION: Negative. Electronically Signed   By: Lupita Raider M.D.   On: 08/04/2023 17:39   DG Humerus Left  Result Date: 08/04/2023 CLINICAL DATA:  Status post fall with multiple bruises to the upper and lower extremities EXAM: LEFT HUMERUS - 2 VIEW; LEFT FOREARM - 2 VIEW COMPARISON:  None Available. FINDINGS: There is no evidence of fracture or other focal bone lesions. Soft tissues are unremarkable. IMPRESSION: No acute fracture or dislocation. Electronically Signed    By: Agustin Cree M.D.   On: 08/04/2023 17:38   DG Forearm Left  Result Date: 08/04/2023 CLINICAL DATA:  Status post fall with multiple bruises to the upper and lower extremities EXAM: LEFT HUMERUS - 2 VIEW; LEFT FOREARM - 2 VIEW COMPARISON:  None Available. FINDINGS: There is no evidence of fracture or other focal bone lesions. Soft tissues are unremarkable. IMPRESSION: No acute fracture or dislocation. Electronically Signed   By: Agustin Cree M.D.   On: 08/04/2023 17:38   DG Tibia/Fibula Left  Result Date: 08/04/2023 CLINICAL DATA:  Unwitnessed fall.  Bruising of left leg. EXAM: LEFT TIBIA AND FIBULA - 2 VIEW COMPARISON:  None Available. FINDINGS: There is no evidence of fracture or other focal bone lesions. Soft tissues are unremarkable. IMPRESSION: Negative. Electronically Signed   By: Lupita Raider M.D.   On: 08/04/2023 17:38   DG Tibia/Fibula Right  Result Date: 08/04/2023 CLINICAL DATA:  Multifocal bruises and pressure type wounds status post fall. GI bleed. EXAM: RIGHT KNEE - 1-2 VIEW; RIGHT TIBIA AND FIBULA - 2 VIEW COMPARISON:  None Available. FINDINGS: The mineralization and alignment are normal. There is no evidence of acute fracture or dislocation. The joint spaces appear preserved at the ankle and knee.  No evidence of joint effusion, foreign body or soft tissue emphysema. IMPRESSION: No acute osseous findings or significant soft tissue abnormalities identified within the right knee or lower leg. Electronically Signed   By: Carey Bullocks M.D.   On: 08/04/2023 17:35   DG Knee 1-2 Views Right  Result Date: 08/04/2023 CLINICAL DATA:  Multifocal bruises and pressure type wounds status post fall. GI bleed. EXAM: RIGHT KNEE - 1-2 VIEW; RIGHT TIBIA AND FIBULA - 2 VIEW COMPARISON:  None Available. FINDINGS: The mineralization and alignment are normal. There is no evidence of acute fracture or dislocation. The joint spaces appear preserved at the ankle and knee. No evidence of joint effusion,  foreign body or soft tissue emphysema. IMPRESSION: No acute osseous findings or significant soft tissue abnormalities identified within the right knee or lower leg. Electronically Signed   By: Carey Bullocks M.D.   On: 08/04/2023 17:35   DG Humerus Right  Result Date: 08/04/2023 CLINICAL DATA:  Larey Seat, bruising EXAM: RIGHT HUMERUS - 2+ VIEW COMPARISON:  12/17/2013 FINDINGS: Frontal and lateral views of the right humerus are obtained. No acute displaced fracture. Alignment of the right shoulder and elbow is anatomic. Mild acromioclavicular and glenohumeral joint osteoarthritis. Soft tissues are unremarkable. IMPRESSION: 1. 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 2. No acute fracture. Electronically Signed   By: Sharlet Salina M.D.   On: 08/04/2023 17:32   DG Chest Port 1 View  Result Date: 08/04/2023 CLINICAL DATA:  Short of breath, gastrointestinal bleeding EXAM: PORTABLE CHEST 1 VIEW COMPARISON:  08/20/2014 FINDINGS: 2 frontal views of the chest demonstrate an unremarkable cardiac silhouette. No acute airspace disease, effusion, or pneumothorax. No acute bony abnormality. IMPRESSION: 1. No acute intrathoracic process. Electronically Signed   By: Sharlet Salina M.D.   On: 08/04/2023 16:36   Medications: I have reviewed the patient's current medications. Scheduled Meds:  cholecalciferol  5,000 Units Oral Daily   folic acid  1 mg Oral Daily   LORazepam  0-4 mg Intravenous Q6H   Followed by   LORazepam  0-4 mg Intravenous Q12H   Mesalamine  800 mg Oral TID   multivitamin with minerals  1 tablet Oral Daily   mupirocin ointment   Topical Daily   pantoprazole (PROTONIX) IV  40 mg Intravenous Q12H   thiamine  100 mg Oral Daily   Or   thiamine  100 mg Intravenous Daily   Continuous Infusions:  ciprofloxacin Stopped (08/05/23 1624)   lactated ringers 150 mL/hr at 08/05/23 1859   metronidazole 500 mg (08/05/23 2112)   PRN Meds:.hydrALAZINE, LORazepam **OR** LORazepam, morphine injection,  ondansetron (ZOFRAN) IV, traZODone   Assessment: Principal Problem:   Severe sepsis (HCC) Active Problems:   Ulcerative colitis (HCC)   GI bleeding   Acute metabolic encephalopathy   Fall at home, initial encounter   AKI (acute kidney injury) (HCC)   Rhabdomyolysis   Myocardial injury   Abnormal LFTs   HTN (hypertension)   HLD (hyperlipidemia)   Stroke (HCC)   UTI (urinary tract infection)   Alcohol use   Slurred speech   Pulmonary nodule    Plan: This patient came in with sepsis weight loss malnutrition with chronic diarrhea ulcerative colitis and is now receiving antibiotics.  The patient states he feels much better than he did prior to admission but was uncomfortable all night due to the rectal tube.  The patient was started on mesalamine because of his sepsis he cannot be started on steroids.  The patient may  need rehab and should follow-up with [facility] clinic GI who has followed him in the past upon discharge.  The patient is in no condition at this time to undergo any GI procedures or take a prep.  I will sign off.  Please call if any further GI concerns or questions.  We would like to thank you for the opportunity to participate in the care of JAYDEE TERHUNE.    LOS: 2 days   Sherlyn Hay 08/06/2023, 8:24 AM Pager [phone] 7am-5pm  Check AMION for 5pm -7am coverage and on weekends

## 2023-08-07 DIAGNOSIS — A419 Sepsis, unspecified organism: Secondary | ICD-10-CM | POA: Diagnosis not present

## 2023-08-07 DIAGNOSIS — E43 Unspecified severe protein-calorie malnutrition: Secondary | ICD-10-CM | POA: Insufficient documentation

## 2023-08-07 DIAGNOSIS — R6520 Severe sepsis without septic shock: Secondary | ICD-10-CM | POA: Diagnosis not present

## 2023-08-07 LAB — CBC WITH DIFFERENTIAL/PLATELET
Abs Immature Granulocytes: 0.89 10*3/uL — ABNORMAL HIGH (ref 0.00–0.07)
Basophils Absolute: 0 10*3/uL (ref 0.0–0.1)
Basophils Relative: 0 %
Eosinophils Absolute: 0 10*3/uL (ref 0.0–0.5)
Eosinophils Relative: 0 %
HCT: 33.9 % — ABNORMAL LOW (ref 39.0–52.0)
Hemoglobin: 11.9 g/dL — ABNORMAL LOW (ref 13.0–17.0)
Immature Granulocytes: 7 %
Lymphocytes Relative: 6 %
Lymphs Abs: 0.8 10*3/uL (ref 0.7–4.0)
MCH: 28.9 pg (ref 26.0–34.0)
MCHC: 35.1 g/dL (ref 30.0–36.0)
MCV: 82.3 fL (ref 80.0–100.0)
Monocytes Absolute: 0.8 10*3/uL (ref 0.1–1.0)
Monocytes Relative: 6 %
Neutro Abs: 9.8 10*3/uL — ABNORMAL HIGH (ref 1.7–7.7)
Neutrophils Relative %: 81 %
Platelets: 218 10*3/uL (ref 150–400)
RBC: 4.12 MIL/uL — ABNORMAL LOW (ref 4.22–5.81)
RDW: 15.1 % (ref 11.5–15.5)
Smear Review: NORMAL
WBC: 12.3 10*3/uL — ABNORMAL HIGH (ref 4.0–10.5)
nRBC: 0 % (ref 0.0–0.2)

## 2023-08-07 LAB — PHOSPHORUS: Phosphorus: 2.3 mg/dL — ABNORMAL LOW (ref 2.5–4.6)

## 2023-08-07 LAB — MAGNESIUM: Magnesium: 2.3 mg/dL (ref 1.7–2.4)

## 2023-08-07 LAB — COMPREHENSIVE METABOLIC PANEL
ALT: 91 U/L — ABNORMAL HIGH (ref 0–44)
AST: 99 U/L — ABNORMAL HIGH (ref 15–41)
Albumin: 1.9 g/dL — ABNORMAL LOW (ref 3.5–5.0)
Alkaline Phosphatase: 46 U/L (ref 38–126)
Anion gap: 8 (ref 5–15)
BUN: 28 mg/dL — ABNORMAL HIGH (ref 6–20)
CO2: 23 mmol/L (ref 22–32)
Calcium: 7.7 mg/dL — ABNORMAL LOW (ref 8.9–10.3)
Chloride: 99 mmol/L (ref 98–111)
Creatinine, Ser: 1.37 mg/dL — ABNORMAL HIGH (ref 0.61–1.24)
GFR, Estimated: 59 mL/min — ABNORMAL LOW (ref >60)
Glucose, Bld: 109 mg/dL — ABNORMAL HIGH (ref 70–99)
Potassium: 4.1 mmol/L (ref 3.5–5.1)
Sodium: 130 mmol/L — ABNORMAL LOW (ref 135–145)
Total Bilirubin: 0.5 mg/dL (ref 0.3–1.2)
Total Protein: 4.7 g/dL — ABNORMAL LOW (ref 6.5–8.1)

## 2023-08-07 LAB — CK: Total CK: 629 U/L — ABNORMAL HIGH (ref 49–397)

## 2023-08-07 LAB — CULTURE, BLOOD (ROUTINE X 2): Special Requests: ADEQUATE

## 2023-08-07 MED ORDER — ASPIRIN 81 MG PO TBEC: 81.0000 mg | DELAYED_RELEASE_TABLET | Freq: Every day | ORAL | Status: DC

## 2023-08-07 MED ORDER — CIPROFLOXACIN IN D5W 400 MG/200ML IV SOLN
400.0000 mg | Freq: Two times a day (BID) | INTRAVENOUS | Status: DC
  Administered 2023-08-07: 400 mg via INTRAVENOUS
  Filled 2023-08-07 (×2): qty 200

## 2023-08-07 MED ORDER — PANTOPRAZOLE SODIUM 40 MG PO TBEC
40.0000 mg | DELAYED_RELEASE_TABLET | Freq: Two times a day (BID) | ORAL | Status: DC
  Administered 2023-08-07 – 2023-08-09 (×5): 40 mg via ORAL
  Filled 2023-08-07 (×5): qty 1

## 2023-08-07 MED ORDER — LOPERAMIDE HCL 2 MG PO CAPS
2.0000 mg | ORAL_CAPSULE | Freq: Three times a day (TID) | ORAL | Status: AC
  Administered 2023-08-07 – 2023-08-09 (×6): 2 mg via ORAL
  Filled 2023-08-07 (×6): qty 1

## 2023-08-07 NOTE — TOC Progression Note (Addendum)
Transition of Care [facility]) - Progression Note    Patient Details  Name: [name] MRN: [id number] Date of Birth: [birth year]  Transition of Care [facility]) CM/SW Contact  Margarito Liner, LCSW Phone Number: 08/07/2023, 9:16 AM  Clinical Narrative:   [facility] and [facility] have offered a bed. [facility] declined. [facility], [facility], [facility], and [facility] have not responded. CSW left messages for their admissions coordinators asking them to review referral.  1:24 pm: Patient and his girlfriend have accepted the bed offer from [facility]. Left message for admissions coordinator to notify. Sent secure chat to MD to see how soon he might be ready for discharge. Rectal tube will need to be removed prior to discharge and insurance authorization obtained. SA resources added to AVS.  Expected Discharge Plan and Services                                               Social Determinants of Health (SDOH) Interventions SDOH Screenings   Food Insecurity: No Food Insecurity (08/04/2023)  Housing: Low Risk  (08/04/2023)  Transportation Needs: Unmet Transportation Needs (08/04/2023)  Utilities: Not At Risk (08/04/2023)  Tobacco Use: Low Risk  (08/04/2023)    Readmission Risk Interventions     No data to display

## 2023-08-07 NOTE — Plan of Care (Signed)
[name]

## 2023-08-07 NOTE — Progress Notes (Addendum)
Progress Note    [name]  HYW:[id number] DOB: 06/25/1961  DOA: 08/04/2023 PCP: Armando Gang, FNP      Brief Narrative:    Medical records reviewed and are as summarized below:  [name] is a 62 y.o. male with medical history significant for stroke with chronic right hand numbness, ulcerative colitis, GIB, HTN, HLD,  alcohol use disorder, marijuana use disorder, who presented to the hospital with altered mental status, fall and GI bleed.  Reportedly, patient has had intermittent rectal bleeding for over a month.  He drinks beer and liquor almost every day. Apparently, he fell about 3 days prior to admission.  His brother did a wellness check on him and found patient on the ground.   He was admitted to the hospital for severe sepsis secondary to acute UTI, acute metabolic encephalopathy, AKI and rhabdomyolysis.    Assessment/Plan:   Principal Problem:   Severe sepsis (HCC) Active Problems:   UTI (urinary tract infection)   Ulcerative colitis (HCC)   GI bleeding   Acute metabolic encephalopathy   Stroke (HCC)   Slurred speech   Fall at home, initial encounter   AKI (acute kidney injury) (HCC)   Rhabdomyolysis   Myocardial injury   HTN (hypertension)   Abnormal LFTs   HLD (hyperlipidemia)   Alcohol use   Pulmonary nodule   Protein-calorie malnutrition, severe    Body mass index is 23.53 kg/m.    Severe sepsis secondary to acute UTI: Urine culture did not show any growth.  Discontinue ciprofloxacin and Flagyl.   AKI: Creatinine improved to 1.37.  Discontinue IV fluids and monitor BMP. Creatinine on admission was 4.15.  Creatinine was 1.22 on 07/06/2023 (prior to admission). Hyponatremia: Sodium level is stable but still normal.   Rhabdomyolysis: Improved.  Discontinue IV fluids   Acute metabolic encephalopathy: Improving.  Mental status is better.   Ulcerative colitis and GI bleeding, diarrhea: Stools are nonbloody.  Plavix on  hold.  Start Imodium for diarrhea.  Continue mesalamine.  Change IV to oral Protonix.  He has a rectal tube. No plan for endoscopic workup at this time.  GI has signed off.   S/p fall: PT and OT recommended discharge to SNF   Elevated troponins: Attributed to demand ischemia.   Alcohol use disorder: His girlfriend said he had not drank any alcohol for about a month.  Discontinue CIWA protocol. Elevated liver enzymes: This is likely from alcohol use disorder.  Numbers are trending down.   Other comorbidities include hyperlipidemia, hypertension, history of stroke  Diet Order             DIET - DYS 1 Room service appropriate? Yes with Assist; Fluid consistency: Thin  Diet effective now                            Consultants: Gastroenterologist  Procedures: None    Medications:    aspirin EC  81 mg Oral Daily   cholecalciferol  5,000 Units Oral Daily   feeding supplement (NEPRO CARB STEADY)  237 mL Oral TID BM   folic acid  1 mg Oral Daily   loperamide  2 mg Oral TID   LORazepam  0-4 mg Intravenous Q12H   Mesalamine  800 mg Oral TID   multivitamin with minerals  1 tablet Oral Daily   mupirocin ointment   Topical Daily   pantoprazole  40 mg Oral BID  thiamine  100 mg Oral Daily   Continuous Infusions:  ciprofloxacin 400 mg (08/07/23 1038)   metronidazole 500 mg (08/07/23 1241)     Anti-infectives (From admission, onward)    Start     Dose/Rate Route Frequency Ordered Stop   08/07/23 0745  ciprofloxacin (CIPRO) IVPB 400 mg        400 mg 200 mL/hr over 60 Minutes Intravenous Every 12 hours 08/07/23 0733     08/05/23 1200  ciprofloxacin (CIPRO) IVPB 400 mg  Status:  Discontinued        400 mg 200 mL/hr over 60 Minutes Intravenous Every 24 hours 08/04/23 1905 08/07/23 0733   08/04/23 2000  metroNIDAZOLE (FLAGYL) IVPB 500 mg        500 mg 100 mL/hr over 60 Minutes Intravenous Every 12 hours 08/04/23 1851     08/04/23 1700  levofloxacin (LEVAQUIN)  IVPB 750 mg        750 mg 100 mL/hr over 90 Minutes Intravenous  Once 08/04/23 1647 08/04/23 1830              Family Communication/Anticipated D/C date and plan/Code Status   DVT prophylaxis: SCDs Start: 08/04/23 1853     Code Status: Full Code  Family Communication: Plan discussed with his girlfriend at the bedside Disposition Plan: Plan to discharge to SNF   Status is: Inpatient Remains inpatient appropriate because: AKI       Subjective:   Interval events noted.  He still having diarrhea.  His gilfriend, his brother and sister-in-law at the bedside.  Objective:    Vitals:   08/06/23 1153 08/06/23 2026 08/07/23 0230 08/07/23 0834  BP: 96/73 92/72 111/86 108/64  Pulse: 96 99 (!) 106 88  Resp: 18 18 20    Temp: 97.8 F (36.6 C) 97.8 F (36.6 C) 98.7 F (37.1 C) 98 F (36.7 C)  TempSrc: Oral     SpO2: 98% 98% 98% 99%  Weight:      Height:       No data found.   Intake/Output Summary (Last 24 hours) at 08/07/2023 1359 Last data filed at 08/07/2023 1105 Gross per 24 hour  Intake 839.11 ml  Output 925 ml  Net -85.89 ml   Filed Weights   08/04/23 1532  Weight: 74.4 kg    Exam:  GEN: NAD SKIN: Warm and dry EYES: No pallor or icterus ENT: MMM CV: RRR PULM: CTA B ABD: soft, ND, NT, +BS CNS: AAO x 2 (person and place), non focal EXT: Edema bilateral upper extremities.  No tenderness Rectal tube in place       Data Reviewed:   I have personally reviewed following labs and imaging studies:  Labs: Labs show the following:   Basic Metabolic Panel: Recent Labs  Lab 08/04/23 1538 08/04/23 1539 08/05/23 0648 08/06/23 0459 08/07/23 0320  NA 135  --  129* 130* 130*  K 4.1  --  3.9 4.1 4.1  CL 101  --  103 103 99  CO2 17*  --  21* 23 23  GLUCOSE 117*  --  97 90 109*  BUN 47*  --  47* 36* 28*  CREATININE 4.15*  --  3.12* 1.87* 1.37*  CALCIUM 7.7*  --  7.5* 7.5* 7.7*  MG  --  2.6*  --   --  2.3  PHOS  --   --   --   --  2.3*    GFR Estimated Creatinine Clearance: 59.2 mL/min (A) (by C-G formula based  on SCr of 1.37 mg/dL (H)). Liver Function Tests: Recent Labs  Lab 08/04/23 1538 08/05/23 0648 08/06/23 0459 08/07/23 0320  AST 154* 126* 117* 99*  ALT 111* 95* 92* 91*  ALKPHOS 51 40 39 46  BILITOT 0.8 0.7 0.6 0.5  PROT 5.4* 4.3* 4.2* 4.7*  ALBUMIN 2.4* 1.8* 1.8* 1.9*   No results for input(s): "LIPASE", "AMYLASE" in the last 168 hours. No results for input(s): "AMMONIA" in the last 168 hours. Coagulation profile Recent Labs  Lab 08/04/23 1538  INR 1.2    CBC: Recent Labs  Lab 08/04/23 1538 08/04/23 1936 08/05/23 0104 08/05/23 0648 08/06/23 0459 08/07/23 0320  WBC 18.5* 14.6* 13.9* 11.3* 10.4 12.3*  NEUTROABS 16.2*  --   --   --  8.1* 9.8*  HGB 14.3 12.4* 12.5* 11.8* 11.1* 11.9*  HCT 42.3 35.4* 36.0* 33.6* 32.1* 33.9*  MCV 84.3 82.7 83.3 82.6 83.6 82.3  PLT 361 267 257 221 187 218   Cardiac Enzymes: Recent Labs  Lab 08/04/23 1538 08/05/23 0648 08/06/23 0459 08/07/23 0320  CKTOTAL 2,659* 1,450* 929* 629*   BNP (last 3 results) No results for input(s): "PROBNP" in the last 8760 hours. CBG: Recent Labs  Lab 08/05/23 2054 08/06/23 0805  GLUCAP 109* 83   D-Dimer: No results for input(s): "DDIMER" in the last 72 hours. Hgb A1c: Recent Labs    08/04/23 1538  HGBA1C 4.9   Lipid Profile: Recent Labs    08/04/23 1539  CHOL 88  HDL 39*  LDLCALC 28  TRIG 161  CHOLHDL 2.3   Thyroid function studies: No results for input(s): "TSH", "T4TOTAL", "T3FREE", "THYROIDAB" in the last 72 hours.  Invalid input(s): "FREET3" Anemia work up: No results for input(s): "VITAMINB12", "FOLATE", "FERRITIN", "TIBC", "IRON", "RETICCTPCT" in the last 72 hours. Sepsis Labs: Recent Labs  Lab 08/04/23 1831 08/04/23 1936 08/04/23 2151 08/04/23 2321 08/05/23 0104 08/05/23 0342 08/05/23 0648 08/06/23 0459 08/07/23 0320  PROCALCITON  --   --  1.74  --   --   --   --   --   --   WBC  --     < >  --   --  13.9*  --  11.3* 10.4 12.3*  LATICACIDVEN 2.4*  --   --  1.9 1.7 1.2  --   --   --    < > = values in this interval not displayed.    Microbiology Recent Results (from the past 240 hour(s))  Culture, blood (Routine x 2)     Status: None (Preliminary result)   Collection Time: 08/04/23  3:39 PM   Specimen: BLOOD  Result Value Ref Range Status   Specimen Description BLOOD BLOOD RIGHT HAND  Final   Special Requests   Final    BOTTLES DRAWN AEROBIC AND ANAEROBIC Blood Culture adequate volume   Culture   Final    NO GROWTH 3 DAYS Performed at [facility], [address], [place], [place] [address]    Report Status PENDING  Incomplete  Culture, blood (Routine x 2)     Status: Abnormal   Collection Time: 08/04/23  3:40 PM   Specimen: BLOOD  Result Value Ref Range Status   Specimen Description   Final    BLOOD RIGHT ANTECUBITAL Performed at [facility], [address] [place], [place] [address]    Special Requests   Final    BOTTLES DRAWN AEROBIC AND ANAEROBIC Blood Culture adequate volume Performed at [facility], [address] [place], [place] [address]    Culture  Setup Time   Final    GRAM POSITIVE COCCI ANAEROBIC BOTTLE ONLY Organism ID to follow CRITICAL RESULT CALLED TO, READ BACK BY AND VERIFIED WITH: CAROLINE COULTER 1425 08/05/23 MU Performed at [facility], [address] [place], [place] [address]    Culture (A)  Final    STAPHYLOCOCCUS EPIDERMIDIS THE SIGNIFICANCE OF ISOLATING THIS ORGANISM FROM A SINGLE SET OF BLOOD CULTURES WHEN MULTIPLE SETS ARE DRAWN IS UNCERTAIN. PLEASE NOTIFY THE MICROBIOLOGY DEPARTMENT WITHIN ONE WEEK IF SPECIATION AND SENSITIVITIES ARE REQUIRED. Performed at [facility], [address] [place], [place] [address]    Report Status 08/07/2023 FINAL  Final  Blood Culture ID Panel (Reflexed)     Status: Abnormal   Collection Time: 08/04/23  3:40 PM  Result Value Ref Range  Status   Enterococcus faecalis NOT DETECTED NOT DETECTED Final   Enterococcus Faecium NOT DETECTED NOT DETECTED Final   Listeria monocytogenes NOT DETECTED NOT DETECTED Final   Staphylococcus species DETECTED (A) NOT DETECTED Final    Comment: CRITICAL RESULT CALLED TO, READ BACK BY AND VERIFIED WITH: CAROLINE COULTER 1424 08/05/23 MU    Staphylococcus aureus (BCID) NOT DETECTED NOT DETECTED Final   Staphylococcus epidermidis DETECTED (A) NOT DETECTED Final    Comment: CRITICAL RESULT CALLED TO, READ BACK BY AND VERIFIED WITH: CAROLINE COULTER 1424 08/05/23 MU    Staphylococcus lugdunensis NOT DETECTED NOT DETECTED Final   Streptococcus species NOT DETECTED NOT DETECTED Final   Streptococcus agalactiae NOT DETECTED NOT DETECTED Final   Streptococcus pneumoniae NOT DETECTED NOT DETECTED Final   Streptococcus pyogenes NOT DETECTED NOT DETECTED Final   A.calcoaceticus-baumannii NOT DETECTED NOT DETECTED Final   Bacteroides fragilis NOT DETECTED NOT DETECTED Final   Enterobacterales NOT DETECTED NOT DETECTED Final   Enterobacter cloacae complex NOT DETECTED NOT DETECTED Final   Escherichia coli NOT DETECTED NOT DETECTED Final   Klebsiella aerogenes NOT DETECTED NOT DETECTED Final   Klebsiella oxytoca NOT DETECTED NOT DETECTED Final   Klebsiella pneumoniae NOT DETECTED NOT DETECTED Final   Proteus species NOT DETECTED NOT DETECTED Final   Salmonella species NOT DETECTED NOT DETECTED Final   Serratia marcescens NOT DETECTED NOT DETECTED Final   Haemophilus influenzae NOT DETECTED NOT DETECTED Final   Neisseria meningitidis NOT DETECTED NOT DETECTED Final   Pseudomonas aeruginosa NOT DETECTED NOT DETECTED Final   Stenotrophomonas maltophilia NOT DETECTED NOT DETECTED Final   Candida albicans NOT DETECTED NOT DETECTED Final   Candida auris NOT DETECTED NOT DETECTED Final   Candida glabrata NOT DETECTED NOT DETECTED Final   Candida krusei NOT DETECTED NOT DETECTED Final   Candida  parapsilosis NOT DETECTED NOT DETECTED Final   Candida tropicalis NOT DETECTED NOT DETECTED Final   Cryptococcus neoformans/gattii NOT DETECTED NOT DETECTED Final   Methicillin resistance mecA/C NOT DETECTED NOT DETECTED Final    Comment: Performed at [facility], [address], [place], [place] [address]  Urine Culture     Status: None   Collection Time: 08/04/23  6:31 PM   Specimen: Urine, Random  Result Value Ref Range Status   Specimen Description   Final    URINE, RANDOM Performed at [facility], [address] [place], [place] [address]    Special Requests   Final    NONE Reflexed from [phone] Performed at [facility], [address], [place], [place] [address]    Culture   Final  NO GROWTH Performed at [facility], [address], [place], [place] [address]    Report Status 08/06/2023 FINAL  Final  C Difficile Quick Screen w PCR reflex     Status: None   Collection Time: 08/05/23  2:10 AM   Specimen: STOOL  Result Value Ref Range Status   C Diff antigen NEGATIVE NEGATIVE Final   C Diff toxin NEGATIVE NEGATIVE Final   C Diff interpretation No C. difficile detected.  Final    Comment: Performed at [facility], [address] [place], [place] [address]  Gastrointestinal Panel by PCR , Stool     Status: None   Collection Time: 08/05/23  9:30 PM   Specimen: Rectum; Stool  Result Value Ref Range Status   Campylobacter species NOT DETECTED NOT DETECTED Final   Plesimonas shigelloides NOT DETECTED NOT DETECTED Final   Salmonella species NOT DETECTED NOT DETECTED Final   Yersinia enterocolitica NOT DETECTED NOT DETECTED Final   Vibrio species NOT DETECTED NOT DETECTED Final   Vibrio cholerae NOT DETECTED NOT DETECTED Final   Enteroaggregative E coli (EAEC) NOT DETECTED NOT DETECTED Final   Enteropathogenic E coli (EPEC) NOT DETECTED NOT DETECTED Final   Enterotoxigenic E coli (ETEC) NOT DETECTED NOT DETECTED Final   Shiga  like toxin producing E coli (STEC) NOT DETECTED NOT DETECTED Final   Shigella/Enteroinvasive E coli (EIEC) NOT DETECTED NOT DETECTED Final   Cryptosporidium NOT DETECTED NOT DETECTED Final   Cyclospora cayetanensis NOT DETECTED NOT DETECTED Final   Entamoeba histolytica NOT DETECTED NOT DETECTED Final   Giardia lamblia NOT DETECTED NOT DETECTED Final   Adenovirus F40/41 NOT DETECTED NOT DETECTED Final   Astrovirus NOT DETECTED NOT DETECTED Final   Norovirus GI/GII NOT DETECTED NOT DETECTED Final   Rotavirus A NOT DETECTED NOT DETECTED Final   Sapovirus (I, II, IV, and V) NOT DETECTED NOT DETECTED Final    Comment: Performed at [facility], [address] [place], [place] [address]    Procedures and diagnostic studies:  No results found.             LOS: 3 days   Quinisha Mould  [facility]   Pager on [email or web address]. If 7PM-7AM, please contact night-coverage at [email or web address]     08/07/2023, 1:59 PM

## 2023-08-07 NOTE — Discharge Instructions (Addendum)
Intensive Outpatient Programs   [facility] [address] [place],  [place], [place] [phone]  [facility] (Inpatient and [phone] (Suboxone and Methadone) [address] [phone]  [facility] - Intensive Outpatient [address] [place], [place] [address]  [phone]  [facility] (Outpatient, Inpatient, [facility] (Groups and Residental) (insurance only) [phone] [place], [place] [phone]   [facility] (for caregivers and family) [address] Laurell Josephs [address] [place], [place] [phone]  Residential Treatment Programs  [facility] [address](2 years) Residential: 69 days)[facility] [address] [place], [place], [place] [phone] or [phone]  [facility] [address] [place], [place], [place] [phone]  [facility] ([facility]) [address] [place], [place], [place] [phone] Admissions: 8am-3pm M-F  [facility] Program: Residential Program [phone] Days)             [facility]: [facility]  [place], [place], [place]  [phone] or [phone] in Hours over the weekend or by referral)  [facility] [address] [place], [place] [address] [phone] (Do virtual or phone assessment, offer transportation within 25 miles, have in patient and Outpatient options)   Mobil Crisis: [facility]:[phone] (for crisis  response 24 hours a day)      Psychologist, counselling Name: [facility] Agency Address: [address] [place], [place] [address] Phone: [phone] Email: [email or web address] Website: [email or web address] Service(s) Offered: Housing services, self-sufficiency, congregate meal program, weatherization program, Field seismologist program, emergency food assistance,  housing counseling, home ownership program, wheels-towork program.  Agency Name: [facility] [employer] [phone]) Address: [address], [place], [place] [address] Phone: [phone] Website: [email or web address] Service(s) Offered: Transportation for [employer], subscription and demand response; Dial-a-Ride for citizens 70 years of age or older.  Agency Name: Department of Social Services Address: [address] [place], [place] [address] Phone: [phone] Service(s) Offered: Child support services; child welfare services; food stamps; Medicaid; work first family assistance; and aid with fuel,  rent, food and medicine, transportation assistance.  Agency Name: Disabled [employer] (DA[employer]) Transportation  Network Phone: [phone] Service(s) Offered: Transports veterans to the [facility] medical center. Call  forty-eight hours in advance and leave the name, telephone  number, date, and time of appointment. Veteran will be  contacted by the driver the day before the appointment to  arrange a pick up point

## 2023-08-07 NOTE — Care Management Important Message (Signed)
Important Message  Patient Details  Name: [name] MRN: [id number] Date of Birth: 04-11-1961   Medicare Important Message Given:  Yes     Johnell Comings 08/07/2023, 2:51 PM

## 2023-08-07 NOTE — Plan of Care (Signed)
  Problem: Education: Goal: Knowledge of General Education information will improve Description: Including pain rating scale, medication(s)/side effects and non-pharmacologic comfort measures Outcome: Progressing   Problem: Clinical Measurements: Goal: Will remain free from infection Outcome: Progressing   Problem: Clinical Measurements: Goal: Respiratory complications will improve Outcome: Progressing   Problem: Elimination: Goal: Will not experience complications related to bowel motility Outcome: Progressing   Problem: Elimination: Goal: Will not experience complications related to urinary retention Outcome: Progressing

## 2023-08-07 NOTE — Progress Notes (Addendum)
Physical Therapy Treatment Patient Details Name: [name] MRN: [id number] DOB: [birth year] Today's Date: 08/07/2023   History of Present Illness Patient is a 62 y.o. male with medical history significant of stroke with chronic right hand numbness, ulcerative colitis, GIB, HTN, HLD, alcohol use, marijuana abuse, who presents with altered mental status, fall and GI bleeding. Current MD assessment includes: Severe sepsis, UTI, Ulcerative colitis and GI bleeding, slurred speech, post fall at home, and Rhabdomyolysis.    PT Comments  Pt was pleasant and motivated to participate during the session and put forth good effort throughout. Pt presenting with increased impulsiveness throughout session compared to prior sessions. Pt is currently Mod A for supine to sit, and Mod A +2 for sit to supine d/t pt's impulsiveness to lay down when seated EOB. Pt able to perform STS x2 with RW with Mod-Min A depending on pt's ability to follow cues and sequencing directions. Pt able to take a few side steps after each trial of standing, once up pt taking 2-3 steps before pt impulsively sat down. Pt will benefit from continued PT services upon discharge to safely address deficits listed in patient problem list for decreased caregiver assistance and eventual return to PLOF.    If plan is discharge home, recommend the following: A little help with walking and/or transfers;A little help with bathing/dressing/bathroom;Assistance with cooking/housework;Assist for transportation;Help with stairs or ramp for entrance   Can travel by private vehicle     No  Equipment Recommendations  Other (comment) (TBD at next venue of care)    Recommendations for Other Services       Precautions / Restrictions Precautions Precautions: Fall Restrictions Weight Bearing Restrictions: No     Mobility  Bed Mobility Overal bed mobility: Needs Assistance Bed Mobility: Supine to Sit, Sit to Supine     Supine to sit: Mod  assist Sit to supine: Mod assist, +2 for physical assistance   General bed mobility comments: Needing verbal cues for sequencing    Transfers Overall transfer level: Needs assistance Equipment used: Rolling walker (2 wheels) Transfers: Sit to/from Stand Sit to Stand: Mod assist, Min assist           General transfer comment: Mod A progressing to Min A when pt following cues for sequencing and hand placement    Ambulation/Gait Ambulation/Gait assistance: Min assist Gait Distance (Feet): 2 Feet x2 Assistive device: Rolling walker (2 wheels) Gait Pattern/deviations: Shuffle, Step-to pattern, Decreased step length - right, Decreased step length - left, Trunk flexed Gait velocity: decreased     General Gait Details: Pt able to take few steps before sititng down without warning due to him feeling like he was falling. Pt notably leaning on RW despit cues for upright posture   Stairs             Wheelchair Mobility     Tilt Bed    Modified Rankin (Stroke Patients Only)       Balance Overall balance assessment: Needs assistance Sitting-balance support: Feet supported, Bilateral upper extremity supported Sitting balance-Leahy Scale: Fair     Standing balance support: Reliant on assistive device for balance, During functional activity, Bilateral upper extremity supported Standing balance-Leahy Scale: Poor                              Cognition Arousal: Alert Behavior During Therapy: Impulsive Overall Cognitive Status: Impaired/Different from baseline  General Comments: Pt appearing impulsive today compared to prior sessions, requiring multiple redirects throughout session.        Exercises      General Comments        Pertinent Vitals/Pain Pain Assessment Pain Assessment: Faces Faces Pain Scale: Hurts little more Pain Location: generalized Pain Descriptors / Indicators: Moaning,  Guarding Pain Intervention(s): Monitored during session    Home Living                          Prior Function            PT Goals (current goals can now be found in the care plan section) Progress towards PT goals: Progressing toward goals    Frequency    Min 1X/week      PT Plan      Co-evaluation              AM-PAC PT "6 Clicks" Mobility   Outcome Measure  Help needed turning from your back to your side while in a flat bed without using bedrails?: A Little Help needed moving from lying on your back to sitting on the side of a flat bed without using bedrails?: A Little Help needed moving to and from a bed to a chair (including a wheelchair)?: A Lot Help needed standing up from a chair using your arms (e.g., wheelchair or bedside chair)?: A Lot Help needed to walk in hospital room?: A Lot Help needed climbing 3-5 steps with a railing? : Total 6 Click Score: 13    End of Session Equipment Utilized During Treatment: Gait belt Activity Tolerance: Patient tolerated treatment well Patient left: in bed;with call bell/phone within reach;Other (comment) (bed alarm unable to be set, nursing notified of pt status and position) Nurse Communication: Mobility status PT Visit Diagnosis: Unsteadiness on feet (R26.81);Muscle weakness (generalized) (M62.81);Difficulty in walking, not elsewhere classified (R26.2)     Time: 1610-9604 PT Time Calculation (min) (ACUTE ONLY): 26 min  Charges:                            Cecile Sheerer, SPT 08/07/23, 2:28 PM  This entire session was performed under direct supervision and direction of a licensed therapist/therapist assistant. I have personally read, edited and approve of the note as written.  Loran Senters, DPT

## 2023-08-08 DIAGNOSIS — R6520 Severe sepsis without septic shock: Secondary | ICD-10-CM | POA: Diagnosis not present

## 2023-08-08 DIAGNOSIS — A419 Sepsis, unspecified organism: Secondary | ICD-10-CM | POA: Diagnosis not present

## 2023-08-08 LAB — CBC WITH DIFFERENTIAL/PLATELET
Abs Immature Granulocytes: 1.32 10*3/uL — ABNORMAL HIGH (ref 0.00–0.07)
Basophils Absolute: 0 10*3/uL (ref 0.0–0.1)
Basophils Relative: 0 %
Eosinophils Absolute: 0.1 10*3/uL (ref 0.0–0.5)
Eosinophils Relative: 1 %
HCT: 33.2 % — ABNORMAL LOW (ref 39.0–52.0)
Hemoglobin: 11.5 g/dL — ABNORMAL LOW (ref 13.0–17.0)
Immature Granulocytes: 14 %
Lymphocytes Relative: 9 %
Lymphs Abs: 0.8 10*3/uL (ref 0.7–4.0)
MCH: 29.3 pg (ref 26.0–34.0)
MCHC: 34.6 g/dL (ref 30.0–36.0)
MCV: 84.7 fL (ref 80.0–100.0)
Monocytes Absolute: 0.6 10*3/uL (ref 0.1–1.0)
Monocytes Relative: 6 %
Neutro Abs: 6.9 10*3/uL (ref 1.7–7.7)
Neutrophils Relative %: 70 %
Platelets: 208 10*3/uL (ref 150–400)
RBC: 3.92 MIL/uL — ABNORMAL LOW (ref 4.22–5.81)
RDW: 15.3 % (ref 11.5–15.5)
Smear Review: NORMAL
WBC: 9.8 10*3/uL (ref 4.0–10.5)
nRBC: 0 % (ref 0.0–0.2)

## 2023-08-08 LAB — COMPREHENSIVE METABOLIC PANEL
ALT: 86 U/L — ABNORMAL HIGH (ref 0–44)
AST: 84 U/L — ABNORMAL HIGH (ref 15–41)
Albumin: 1.9 g/dL — ABNORMAL LOW (ref 3.5–5.0)
Alkaline Phosphatase: 44 U/L (ref 38–126)
Anion gap: 12 (ref 5–15)
BUN: 21 mg/dL — ABNORMAL HIGH (ref 6–20)
CO2: 24 mmol/L (ref 22–32)
Calcium: 8.7 mg/dL — ABNORMAL LOW (ref 8.9–10.3)
Chloride: 101 mmol/L (ref 98–111)
Creatinine, Ser: 0.95 mg/dL (ref 0.61–1.24)
GFR, Estimated: >60 mL/min (ref >60)
Glucose, Bld: 97 mg/dL (ref 70–99)
Potassium: 4 mmol/L (ref 3.5–5.1)
Sodium: 131 mmol/L — ABNORMAL LOW (ref 135–145)
Total Bilirubin: 0.2 mg/dL — ABNORMAL LOW (ref 0.3–1.2)
Total Protein: 4.4 g/dL — ABNORMAL LOW (ref 6.5–8.1)

## 2023-08-08 LAB — TYPE AND SCREEN
ABO/RH(D): O POS
Antibody Screen: POSITIVE
Unit division: 0
Unit division: 0

## 2023-08-08 LAB — BPAM RBC
Blood Product Expiration Date: 202409112359
Blood Product Expiration Date: 202409232359
Unit Type and Rh: 5100
Unit Type and Rh: 5100

## 2023-08-08 LAB — GLUCOSE, CAPILLARY: Glucose-Capillary: 82 mg/dL (ref 70–99)

## 2023-08-08 LAB — PHOSPHORUS: Phosphorus: 2.3 mg/dL — ABNORMAL LOW (ref 2.5–4.6)

## 2023-08-08 LAB — MAGNESIUM: Magnesium: 2 mg/dL (ref 1.7–2.4)

## 2023-08-08 MED ORDER — DIPHENOXYLATE-ATROPINE 2.5-0.025 MG PO TABS
1.0000 | ORAL_TABLET | Freq: Four times a day (QID) | ORAL | Status: DC
  Administered 2023-08-08 – 2023-08-09 (×6): 1 via ORAL
  Filled 2023-08-08 (×6): qty 1

## 2023-08-08 MED ORDER — K PHOS MONO-SOD PHOS DI & MONO 155-852-130 MG PO TABS
500.0000 mg | ORAL_TABLET | Freq: Two times a day (BID) | ORAL | Status: AC
  Administered 2023-08-08 (×2): 500 mg via ORAL
  Filled 2023-08-08 (×2): qty 2

## 2023-08-08 NOTE — Progress Notes (Signed)
Progress Note    [name]  KYH:[id number] DOB: 03/26/1961  DOA: 08/04/2023 PCP: Armando Gang, FNP      Brief Narrative:    Medical records reviewed and are as summarized below:  [name] is a 62 y.o. male with medical history significant for stroke with chronic right hand numbness, ulcerative colitis, GIB, HTN, HLD,  alcohol use disorder, marijuana use disorder, who presented to the hospital with altered mental status, fall and GI bleed.  Reportedly, patient has had intermittent rectal bleeding for over a month.  He drinks beer and liquor almost every day. Apparently, he fell about 3 days prior to admission.  His brother did a wellness check on him and found patient on the ground.   He was admitted to the hospital for severe sepsis secondary to acute UTI, acute metabolic encephalopathy, AKI and rhabdomyolysis.    Assessment/Plan:   Principal Problem:   Severe sepsis (HCC) Active Problems:   UTI (urinary tract infection)   Ulcerative colitis (HCC)   GI bleeding   Acute metabolic encephalopathy   Stroke (HCC)   Slurred speech   Fall at home, initial encounter   AKI (acute kidney injury) (HCC)   Rhabdomyolysis   Myocardial injury   HTN (hypertension)   Abnormal LFTs   HLD (hyperlipidemia)   Alcohol use   Pulmonary nodule   Protein-calorie malnutrition, severe    Body mass index is 23.53 kg/m.    Severe sepsis secondary to acute UTI: Urine culture did not show any growth.  Completed 4 days of ciprofloxacin and Flagyl.  On 08/07/2023   AKI: Resolved.   Creatinine on admission was 4.15.  Creatinine was 1.22 on 07/06/2023 (prior to admission). Hyponatremia: Sodium level is stable but still low   Ulcerative colitis and GI bleeding, diarrhea: Stools are nonbloody.  Plavix on hold.  Add Lomotil for diarrhea.  Continue Imodium and mesalamine.   Plan to remove rectal tube today if diarrhea improves. No plan for endoscopic workup at this time.   GI has signed off. Outpatient follow-up with gastroenterologist recommended.   S/p fall: PT and OT recommended discharge to SNF   Elevated troponins: Attributed to demand ischemia.   Alcohol use disorder: His girlfriend said he had not drank any alcohol for about a month.  Elevated liver enzymes: This is likely from alcohol use disorder.  Numbers are trending down.   Acute metabolic encephalopathy, rhabdomyolysis: Improved   Other comorbidities include hyperlipidemia, hypertension, history of stroke    Diet Order             DIET - DYS 1 Room service appropriate? Yes with Assist; Fluid consistency: Thin  Diet effective now                            Consultants: Gastroenterologist  Procedures: None    Medications:    cholecalciferol  5,000 Units Oral Daily   diphenoxylate-atropine  1 tablet Oral QID   feeding supplement (NEPRO CARB STEADY)  237 mL Oral TID BM   folic acid  1 mg Oral Daily   loperamide  2 mg Oral TID   Mesalamine  800 mg Oral TID   multivitamin with minerals  1 tablet Oral Daily   mupirocin ointment   Topical Daily   pantoprazole  40 mg Oral BID   thiamine  100 mg Oral Daily   Continuous Infusions:     Anti-infectives (From admission,  onward)    Start     Dose/Rate Route Frequency Ordered Stop   08/07/23 0745  ciprofloxacin (CIPRO) IVPB 400 mg  Status:  Discontinued        400 mg 200 mL/hr over 60 Minutes Intravenous Every 12 hours 08/07/23 0733 08/07/23 1414   08/05/23 1200  ciprofloxacin (CIPRO) IVPB 400 mg  Status:  Discontinued        400 mg 200 mL/hr over 60 Minutes Intravenous Every 24 hours 08/04/23 1905 08/07/23 0733   08/04/23 2000  metroNIDAZOLE (FLAGYL) IVPB 500 mg  Status:  Discontinued        500 mg 100 mL/hr over 60 Minutes Intravenous Every 12 hours 08/04/23 1851 08/07/23 1414   08/04/23 1700  levofloxacin (LEVAQUIN) IVPB 750 mg        750 mg 100 mL/hr over 90 Minutes Intravenous  Once 08/04/23 1647  08/04/23 1830              Family Communication/Anticipated D/C date and plan/Code Status   DVT prophylaxis: SCDs Start: 08/04/23 1853     Code Status: Full Code  Family Communication: None Disposition Plan: Plan to discharge to SNF tomorrow   Status is: Inpatient Remains inpatient appropriate because: AKI, diarrhea       Subjective:   Interval events noted.  Chart review shows he is still having diarrhea.  No other complaints.  Objective:    Vitals:   08/07/23 2148 08/07/23 2154 08/08/23 0855 08/08/23 0856  BP: 93/65 93/65 92/64    Pulse: (!) 104 100 (!) 106 96  Resp: 16 16 18    Temp: 98.9 F (37.2 C) 98.9 F (37.2 C) 98.3 F (36.8 C)   TempSrc:  Oral    SpO2: 99% 99% 100%   Weight:      Height:       No data found.   Intake/Output Summary (Last 24 hours) at 08/08/2023 1359 Last data filed at 08/08/2023 1100 Gross per 24 hour  Intake 720 ml  Output 700 ml  Net 20 ml   Filed Weights   08/04/23 1532  Weight: 74.4 kg    Exam:  GEN: NAD SKIN: Warm and dry EYES: No pallor or icterus ENT: MMM CV: RRR PULM: CTA B ABD: soft, ND, NT, +BS CNS: AAO x 3, non focal, slurred speech at baseline EXT: Edema of lateral upper and lower extremities Rectal tube in place     Data Reviewed:   I have personally reviewed following labs and imaging studies:  Labs: Labs show the following:   Basic Metabolic Panel: Recent Labs  Lab 08/04/23 1538 08/04/23 1539 08/05/23 0648 08/06/23 0459 08/07/23 0320 08/08/23 0526  NA 135  --  129* 130* 130* 131*  K 4.1  --  3.9 4.1 4.1 4.0  CL 101  --  103 103 99 101  CO2 17*  --  21* 23 23 24   GLUCOSE 117*  --  97 90 109* 97  BUN 47*  --  47* 36* 28* 21*  CREATININE 4.15*  --  3.12* 1.87* 1.37* 0.95  CALCIUM 7.7*  --  7.5* 7.5* 7.7* 8.7*  MG  --  2.6*  --   --  2.3 2.0  PHOS  --   --   --   --  2.3* 2.3*   GFR Estimated Creatinine Clearance: 85.4 mL/min (by C-G formula based on SCr of 0.95  mg/dL). Liver Function Tests: Recent Labs  Lab 08/04/23 1538 08/05/23 1610 08/06/23 0459 08/07/23 0320 08/08/23  0526  AST 154* 126* 117* 99* 84*  ALT 111* 95* 92* 91* 86*  ALKPHOS 51 40 39 46 44  BILITOT 0.8 0.7 0.6 0.5 0.2*  PROT 5.4* 4.3* 4.2* 4.7* 4.4*  ALBUMIN 2.4* 1.8* 1.8* 1.9* 1.9*   No results for input(s): "LIPASE", "AMYLASE" in the last 168 hours. No results for input(s): "AMMONIA" in the last 168 hours. Coagulation profile Recent Labs  Lab 08/04/23 1538  INR 1.2    CBC: Recent Labs  Lab 08/04/23 1538 08/04/23 1936 08/05/23 0104 08/05/23 0648 08/06/23 0459 08/07/23 0320 08/08/23 0526  WBC 18.5*   < > 13.9* 11.3* 10.4 12.3* 9.8  NEUTROABS 16.2*  --   --   --  8.1* 9.8* 6.9  HGB 14.3   < > 12.5* 11.8* 11.1* 11.9* 11.5*  HCT 42.3   < > 36.0* 33.6* 32.1* 33.9* 33.2*  MCV 84.3   < > 83.3 82.6 83.6 82.3 84.7  PLT 361   < > 257 221 187 218 208   < > = values in this interval not displayed.   Cardiac Enzymes: Recent Labs  Lab 08/04/23 1538 08/05/23 0648 08/06/23 0459 08/07/23 0320  CKTOTAL 2,659* 1,450* 929* 629*   BNP (last 3 results) No results for input(s): "PROBNP" in the last 8760 hours. CBG: Recent Labs  Lab 08/05/23 2054 08/06/23 0805 08/08/23 0848  GLUCAP 109* 83 82   D-Dimer: No results for input(s): "DDIMER" in the last 72 hours. Hgb A1c: No results for input(s): "HGBA1C" in the last 72 hours.  Lipid Profile: No results for input(s): "CHOL", "HDL", "LDLCALC", "TRIG", "CHOLHDL", "LDLDIRECT" in the last 72 hours.  Thyroid function studies: No results for input(s): "TSH", "T4TOTAL", "T3FREE", "THYROIDAB" in the last 72 hours.  Invalid input(s): "FREET3" Anemia work up: No results for input(s): "VITAMINB12", "FOLATE", "FERRITIN", "TIBC", "IRON", "RETICCTPCT" in the last 72 hours. Sepsis Labs: Recent Labs  Lab 08/04/23 1831 08/04/23 1936 08/04/23 2151 08/04/23 2321 08/05/23 0104 08/05/23 0342 08/05/23 4098 08/06/23 0459  08/07/23 0320 08/08/23 0526  PROCALCITON  --   --  1.74  --   --   --   --   --   --   --   WBC  --    < >  --   --  13.9*  --  11.3* 10.4 12.3* 9.8  LATICACIDVEN 2.4*  --   --  1.9 1.7 1.2  --   --   --   --    < > = values in this interval not displayed.    Microbiology Recent Results (from the past 240 hour(s))  Culture, blood (Routine x 2)     Status: None (Preliminary result)   Collection Time: 08/04/23  3:39 PM   Specimen: BLOOD  Result Value Ref Range Status   Specimen Description BLOOD BLOOD RIGHT HAND  Final   Special Requests   Final    BOTTLES DRAWN AEROBIC AND ANAEROBIC Blood Culture adequate volume   Culture   Final    NO GROWTH 4 DAYS Performed at [facility], [address], [place], [place] [address]    Report Status PENDING  Incomplete  Culture, blood (Routine x 2)     Status: Abnormal   Collection Time: 08/04/23  3:40 PM   Specimen: BLOOD  Result Value Ref Range Status   Specimen Description   Final    BLOOD RIGHT ANTECUBITAL Performed at [facility], [address] [place], [place] [address]    Special  Requests   Final    BOTTLES DRAWN AEROBIC AND ANAEROBIC Blood Culture adequate volume Performed at [facility], [address] [place], [place] [address]    Culture  Setup Time   Final    GRAM POSITIVE COCCI ANAEROBIC BOTTLE ONLY Organism ID to follow CRITICAL RESULT CALLED TO, READ BACK BY AND VERIFIED WITH: CAROLINE COULTER 1425 08/05/23 MU Performed at [facility], [address] [place], [place] [address]    Culture (A)  Final    STAPHYLOCOCCUS EPIDERMIDIS THE SIGNIFICANCE OF ISOLATING THIS ORGANISM FROM A SINGLE SET OF BLOOD CULTURES WHEN MULTIPLE SETS ARE DRAWN IS UNCERTAIN. PLEASE NOTIFY THE MICROBIOLOGY DEPARTMENT WITHIN ONE WEEK IF SPECIATION AND SENSITIVITIES ARE REQUIRED. Performed at [facility], [address], [place], [place] [address]    Report Status 08/07/2023 FINAL  Final  Blood  Culture ID Panel (Reflexed)     Status: Abnormal   Collection Time: 08/04/23  3:40 PM  Result Value Ref Range Status   Enterococcus faecalis NOT DETECTED NOT DETECTED Final   Enterococcus Faecium NOT DETECTED NOT DETECTED Final   Listeria monocytogenes NOT DETECTED NOT DETECTED Final   Staphylococcus species DETECTED (A) NOT DETECTED Final    Comment: CRITICAL RESULT CALLED TO, READ BACK BY AND VERIFIED WITH: CAROLINE COULTER 1424 08/05/23 MU    Staphylococcus aureus (BCID) NOT DETECTED NOT DETECTED Final   Staphylococcus epidermidis DETECTED (A) NOT DETECTED Final    Comment: CRITICAL RESULT CALLED TO, READ BACK BY AND VERIFIED WITH: CAROLINE COULTER 1424 08/05/23 MU    Staphylococcus lugdunensis NOT DETECTED NOT DETECTED Final   Streptococcus species NOT DETECTED NOT DETECTED Final   Streptococcus agalactiae NOT DETECTED NOT DETECTED Final   Streptococcus pneumoniae NOT DETECTED NOT DETECTED Final   Streptococcus pyogenes NOT DETECTED NOT DETECTED Final   A.calcoaceticus-baumannii NOT DETECTED NOT DETECTED Final   Bacteroides fragilis NOT DETECTED NOT DETECTED Final   Enterobacterales NOT DETECTED NOT DETECTED Final   Enterobacter cloacae complex NOT DETECTED NOT DETECTED Final   Escherichia coli NOT DETECTED NOT DETECTED Final   Klebsiella aerogenes NOT DETECTED NOT DETECTED Final   Klebsiella oxytoca NOT DETECTED NOT DETECTED Final   Klebsiella pneumoniae NOT DETECTED NOT DETECTED Final   Proteus species NOT DETECTED NOT DETECTED Final   Salmonella species NOT DETECTED NOT DETECTED Final   Serratia marcescens NOT DETECTED NOT DETECTED Final   Haemophilus influenzae NOT DETECTED NOT DETECTED Final   Neisseria meningitidis NOT DETECTED NOT DETECTED Final   Pseudomonas aeruginosa NOT DETECTED NOT DETECTED Final   Stenotrophomonas maltophilia NOT DETECTED NOT DETECTED Final   Candida albicans NOT DETECTED NOT DETECTED Final   Candida auris NOT DETECTED NOT DETECTED Final   Candida  glabrata NOT DETECTED NOT DETECTED Final   Candida krusei NOT DETECTED NOT DETECTED Final   Candida parapsilosis NOT DETECTED NOT DETECTED Final   Candida tropicalis NOT DETECTED NOT DETECTED Final   Cryptococcus neoformans/gattii NOT DETECTED NOT DETECTED Final   Methicillin resistance mecA/C NOT DETECTED NOT DETECTED Final    Comment: Performed at [facility], [address] [place], [place] [address]  Urine Culture     Status: None   Collection Time: 08/04/23  6:31 PM   Specimen: Urine, Random  Result Value Ref Range Status   Specimen Description   Final    URINE, RANDOM Performed at [facility], [address] [place], [place] [address]    Special Requests   Final  NONE Reflexed from [id number] Performed at [facility], [address], [place], [place] [address]    Culture   Final    NO GROWTH Performed at [facility], [address], [place], [place] [address]    Report Status 08/06/2023 FINAL  Final  C Difficile Quick Screen w PCR reflex     Status: None   Collection Time: 08/05/23  2:10 AM   Specimen: STOOL  Result Value Ref Range Status   C Diff antigen NEGATIVE NEGATIVE Final   C Diff toxin NEGATIVE NEGATIVE Final   C Diff interpretation No C. difficile detected.  Final    Comment: Performed at [facility], [address] [place], [place] [address]  Gastrointestinal Panel by PCR , Stool     Status: None   Collection Time: 08/05/23  9:30 PM   Specimen: Rectum; Stool  Result Value Ref Range Status   Campylobacter species NOT DETECTED NOT DETECTED Final   Plesimonas shigelloides NOT DETECTED NOT DETECTED Final   Salmonella species NOT DETECTED NOT DETECTED Final   Yersinia enterocolitica NOT DETECTED NOT DETECTED Final   Vibrio species NOT DETECTED NOT DETECTED Final   Vibrio cholerae NOT DETECTED NOT DETECTED Final   Enteroaggregative E coli (EAEC) NOT DETECTED NOT DETECTED Final   Enteropathogenic E coli (EPEC) NOT  DETECTED NOT DETECTED Final   Enterotoxigenic E coli (ETEC) NOT DETECTED NOT DETECTED Final   Shiga like toxin producing E coli (STEC) NOT DETECTED NOT DETECTED Final   Shigella/Enteroinvasive E coli (EIEC) NOT DETECTED NOT DETECTED Final   Cryptosporidium NOT DETECTED NOT DETECTED Final   Cyclospora cayetanensis NOT DETECTED NOT DETECTED Final   Entamoeba histolytica NOT DETECTED NOT DETECTED Final   Giardia lamblia NOT DETECTED NOT DETECTED Final   Adenovirus F40/41 NOT DETECTED NOT DETECTED Final   Astrovirus NOT DETECTED NOT DETECTED Final   Norovirus GI/GII NOT DETECTED NOT DETECTED Final   Rotavirus A NOT DETECTED NOT DETECTED Final   Sapovirus (I, II, IV, and V) NOT DETECTED NOT DETECTED Final    Comment: Performed at [facility], [address] [place], [place] [address]    Procedures and diagnostic studies:  No results found.             LOS: 4 days   Kelcey Wickstrom  [facility]   Pager on [email or web address]. If 7PM-7AM, please contact night-coverage at [email or web address]     08/08/2023, 1:59 PM

## 2023-08-08 NOTE — Plan of Care (Signed)
?  Problem: Education: ?Goal: Knowledge of General Education information will improve ?Description: Including pain rating scale, medication(s)/side effects and non-pharmacologic comfort measures ?Outcome: Progressing ?  ?Problem: Clinical Measurements: ?Goal: Ability to maintain clinical measurements within normal limits will improve ?Outcome: Progressing ?Goal: Will remain free from infection ?Outcome: Progressing ?Goal: Diagnostic test results will improve ?Outcome: Progressing ?Goal: Respiratory complications will improve ?Outcome: Progressing ?Goal: Cardiovascular complication will be avoided ?Outcome: Progressing ?  ?Problem: Nutrition: ?Goal: Adequate nutrition will be maintained ?Outcome: Progressing ?  ?Problem: Elimination: ?Goal: Will not experience complications related to bowel motility ?Outcome: Progressing ?Goal: Will not experience complications related to urinary retention ?Outcome: Progressing ?  ?Problem: Safety: ?Goal: Ability to remain free from injury will improve ?Outcome: Progressing ?  ?Problem: Skin Integrity: ?Goal: Risk for impaired skin integrity will decrease ?Outcome: Progressing ?  ?

## 2023-08-08 NOTE — TOC Progression Note (Addendum)
Transition of Care [facility]) - Progression Note    Patient Details  Name: [name] MRN: [id number] Date of Birth: 02-Aug-1961  [facility] CM/SW Contact  Margarito Liner, LCSW Phone Number: 08/08/2023, 9:26 AM  Clinical Narrative:   CSW started insurance authorization for [facility] SNF.  1:00 pm: Insurance authorization is approved: [id number]. Valid 8/31-9/4. Left message for SNF admissions coordinator to notify.  1:46 pm: SDOH flag for transportation. Added resources to AVS.  4:35 pm: Girlfriend and patient's brother wanted to switch the SNF to [facility]. CSW explained that [facility] said they are not taking admissions over the weekend or Monday so if patient is stable tomorrow, he would have to move forward with [facility]. If for some reason he is not ready over the weekend, we can revisit switching facilities.  Expected Discharge Plan: Skilled Nursing Facility    Expected Discharge Plan and Services                                               Social Determinants of Health (SDOH) Interventions SDOH Screenings   Food Insecurity: No Food Insecurity (08/04/2023)  Housing: Low Risk  (08/04/2023)  Transportation Needs: Unmet Transportation Needs (08/04/2023)  Utilities: Not At Risk (08/04/2023)  Tobacco Use: Low Risk  (08/04/2023)    Readmission Risk Interventions     No data to display

## 2023-08-08 NOTE — Plan of Care (Signed)
°  Problem: Education: Goal: Knowledge of General Education information will improve Description: Including pain rating scale, medication(s)/side effects and non-pharmacologic comfort measures Outcome: Progressing   Problem: Nutrition: Goal: Adequate nutrition will be maintained Outcome: Progressing   Problem: Activity: Goal: Risk for activity intolerance will decrease Outcome: Progressing   Problem: Coping: Goal: Level of anxiety will decrease Outcome: Progressing   Problem: Safety: Goal: Ability to remain free from injury will improve Outcome: Progressing   Problem: Skin Integrity: Goal: Risk for impaired skin integrity will decrease Outcome: Progressing

## 2023-08-09 DIAGNOSIS — J189 Pneumonia, unspecified organism: Secondary | ICD-10-CM | POA: Diagnosis present

## 2023-08-09 DIAGNOSIS — R51 Headache with orthostatic component, not elsewhere classified: Secondary | ICD-10-CM | POA: Diagnosis not present

## 2023-08-09 DIAGNOSIS — R278 Other lack of coordination: Secondary | ICD-10-CM | POA: Diagnosis not present

## 2023-08-09 DIAGNOSIS — S51011D Laceration without foreign body of right elbow, subsequent encounter: Secondary | ICD-10-CM | POA: Diagnosis not present

## 2023-08-09 DIAGNOSIS — F1010 Alcohol abuse, uncomplicated: Secondary | ICD-10-CM | POA: Diagnosis present

## 2023-08-09 DIAGNOSIS — I82442 Acute embolism and thrombosis of left tibial vein: Secondary | ICD-10-CM | POA: Diagnosis not present

## 2023-08-09 DIAGNOSIS — K51919 Ulcerative colitis, unspecified with unspecified complications: Secondary | ICD-10-CM | POA: Diagnosis not present

## 2023-08-09 DIAGNOSIS — I82432 Acute embolism and thrombosis of left popliteal vein: Secondary | ICD-10-CM | POA: Diagnosis not present

## 2023-08-09 DIAGNOSIS — I82623 Acute embolism and thrombosis of deep veins of upper extremity, bilateral: Secondary | ICD-10-CM | POA: Diagnosis not present

## 2023-08-09 DIAGNOSIS — Z79899 Other long term (current) drug therapy: Secondary | ICD-10-CM | POA: Diagnosis not present

## 2023-08-09 DIAGNOSIS — S31000A Unspecified open wound of lower back and pelvis without penetration into retroperitoneum, initial encounter: Secondary | ICD-10-CM | POA: Diagnosis not present

## 2023-08-09 DIAGNOSIS — E871 Hypo-osmolality and hyponatremia: Secondary | ICD-10-CM | POA: Diagnosis not present

## 2023-08-09 DIAGNOSIS — R1312 Dysphagia, oropharyngeal phase: Secondary | ICD-10-CM | POA: Diagnosis not present

## 2023-08-09 DIAGNOSIS — I5A Non-ischemic myocardial injury (non-traumatic): Secondary | ICD-10-CM | POA: Diagnosis not present

## 2023-08-09 DIAGNOSIS — L89316 Pressure-induced deep tissue damage of right buttock: Secondary | ICD-10-CM | POA: Diagnosis not present

## 2023-08-09 DIAGNOSIS — L8990 Pressure ulcer of unspecified site, unspecified stage: Secondary | ICD-10-CM | POA: Diagnosis not present

## 2023-08-09 DIAGNOSIS — M79602 Pain in left arm: Secondary | ICD-10-CM | POA: Diagnosis not present

## 2023-08-09 DIAGNOSIS — Z880 Allergy status to penicillin: Secondary | ICD-10-CM | POA: Diagnosis not present

## 2023-08-09 DIAGNOSIS — R300 Dysuria: Secondary | ICD-10-CM | POA: Diagnosis not present

## 2023-08-09 DIAGNOSIS — Z7902 Long term (current) use of antithrombotics/antiplatelets: Secondary | ICD-10-CM | POA: Diagnosis not present

## 2023-08-09 DIAGNOSIS — Z8673 Personal history of transient ischemic attack (TIA), and cerebral infarction without residual deficits: Secondary | ICD-10-CM | POA: Diagnosis not present

## 2023-08-09 DIAGNOSIS — Z8249 Family history of ischemic heart disease and other diseases of the circulatory system: Secondary | ICD-10-CM | POA: Diagnosis not present

## 2023-08-09 DIAGNOSIS — L89150 Pressure ulcer of sacral region, unstageable: Secondary | ICD-10-CM | POA: Diagnosis not present

## 2023-08-09 DIAGNOSIS — M2550 Pain in unspecified joint: Secondary | ICD-10-CM | POA: Diagnosis not present

## 2023-08-09 DIAGNOSIS — I82C12 Acute embolism and thrombosis of left internal jugular vein: Secondary | ICD-10-CM | POA: Diagnosis not present

## 2023-08-09 DIAGNOSIS — I82409 Acute embolism and thrombosis of unspecified deep veins of unspecified lower extremity: Secondary | ICD-10-CM | POA: Diagnosis present

## 2023-08-09 DIAGNOSIS — I82452 Acute embolism and thrombosis of left peroneal vein: Secondary | ICD-10-CM | POA: Diagnosis not present

## 2023-08-09 DIAGNOSIS — R601 Generalized edema: Secondary | ICD-10-CM | POA: Diagnosis not present

## 2023-08-09 DIAGNOSIS — D6859 Other primary thrombophilia: Secondary | ICD-10-CM | POA: Diagnosis not present

## 2023-08-09 DIAGNOSIS — I82451 Acute embolism and thrombosis of right peroneal vein: Secondary | ICD-10-CM | POA: Diagnosis not present

## 2023-08-09 DIAGNOSIS — I82611 Acute embolism and thrombosis of superficial veins of right upper extremity: Secondary | ICD-10-CM | POA: Diagnosis not present

## 2023-08-09 DIAGNOSIS — E782 Mixed hyperlipidemia: Secondary | ICD-10-CM | POA: Diagnosis not present

## 2023-08-09 DIAGNOSIS — R531 Weakness: Secondary | ICD-10-CM | POA: Diagnosis not present

## 2023-08-09 DIAGNOSIS — G9341 Metabolic encephalopathy: Secondary | ICD-10-CM | POA: Diagnosis not present

## 2023-08-09 DIAGNOSIS — K922 Gastrointestinal hemorrhage, unspecified: Secondary | ICD-10-CM | POA: Diagnosis not present

## 2023-08-09 DIAGNOSIS — L89326 Pressure-induced deep tissue damage of left buttock: Secondary | ICD-10-CM | POA: Diagnosis not present

## 2023-08-09 DIAGNOSIS — R6520 Severe sepsis without septic shock: Secondary | ICD-10-CM | POA: Diagnosis not present

## 2023-08-09 DIAGNOSIS — E785 Hyperlipidemia, unspecified: Secondary | ICD-10-CM | POA: Diagnosis present

## 2023-08-09 DIAGNOSIS — I8290 Acute embolism and thrombosis of unspecified vein: Secondary | ICD-10-CM | POA: Diagnosis not present

## 2023-08-09 DIAGNOSIS — M533 Sacrococcygeal disorders, not elsewhere classified: Secondary | ICD-10-CM | POA: Diagnosis not present

## 2023-08-09 DIAGNOSIS — I82403 Acute embolism and thrombosis of unspecified deep veins of lower extremity, bilateral: Secondary | ICD-10-CM | POA: Diagnosis not present

## 2023-08-09 DIAGNOSIS — K51918 Ulcerative colitis, unspecified with other complication: Secondary | ICD-10-CM | POA: Diagnosis not present

## 2023-08-09 DIAGNOSIS — K5190 Ulcerative colitis, unspecified, without complications: Secondary | ICD-10-CM | POA: Diagnosis not present

## 2023-08-09 DIAGNOSIS — M7989 Other specified soft tissue disorders: Secondary | ICD-10-CM | POA: Diagnosis not present

## 2023-08-09 DIAGNOSIS — E559 Vitamin D deficiency, unspecified: Secondary | ICD-10-CM | POA: Diagnosis not present

## 2023-08-09 DIAGNOSIS — Z789 Other specified health status: Secondary | ICD-10-CM | POA: Diagnosis not present

## 2023-08-09 DIAGNOSIS — E7800 Pure hypercholesterolemia, unspecified: Secondary | ICD-10-CM | POA: Diagnosis not present

## 2023-08-09 DIAGNOSIS — R6510 Systemic inflammatory response syndrome (SIRS) of non-infectious origin without acute organ dysfunction: Secondary | ICD-10-CM | POA: Diagnosis not present

## 2023-08-09 DIAGNOSIS — A419 Sepsis, unspecified organism: Secondary | ICD-10-CM | POA: Diagnosis not present

## 2023-08-09 DIAGNOSIS — R5381 Other malaise: Secondary | ICD-10-CM | POA: Diagnosis not present

## 2023-08-09 DIAGNOSIS — M6281 Muscle weakness (generalized): Secondary | ICD-10-CM | POA: Diagnosis not present

## 2023-08-09 DIAGNOSIS — Z7401 Bed confinement status: Secondary | ICD-10-CM | POA: Diagnosis not present

## 2023-08-09 DIAGNOSIS — I10 Essential (primary) hypertension: Secondary | ICD-10-CM | POA: Diagnosis not present

## 2023-08-09 DIAGNOSIS — Z881 Allergy status to other antibiotic agents status: Secondary | ICD-10-CM | POA: Diagnosis not present

## 2023-08-09 DIAGNOSIS — N179 Acute kidney failure, unspecified: Secondary | ICD-10-CM | POA: Diagnosis not present

## 2023-08-09 DIAGNOSIS — E43 Unspecified severe protein-calorie malnutrition: Secondary | ICD-10-CM | POA: Diagnosis not present

## 2023-08-09 DIAGNOSIS — S01411D Laceration without foreign body of right cheek and temporomandibular area, subsequent encounter: Secondary | ICD-10-CM | POA: Diagnosis not present

## 2023-08-09 DIAGNOSIS — I82402 Acute embolism and thrombosis of unspecified deep veins of left lower extremity: Secondary | ICD-10-CM | POA: Diagnosis not present

## 2023-08-09 DIAGNOSIS — R911 Solitary pulmonary nodule: Secondary | ICD-10-CM | POA: Diagnosis not present

## 2023-08-09 DIAGNOSIS — M6282 Rhabdomyolysis: Secondary | ICD-10-CM | POA: Diagnosis not present

## 2023-08-09 DIAGNOSIS — I2699 Other pulmonary embolism without acute cor pulmonale: Secondary | ICD-10-CM | POA: Diagnosis not present

## 2023-08-09 DIAGNOSIS — I82412 Acute embolism and thrombosis of left femoral vein: Secondary | ICD-10-CM | POA: Diagnosis not present

## 2023-08-09 DIAGNOSIS — I2693 Single subsegmental pulmonary embolism without acute cor pulmonale: Secondary | ICD-10-CM | POA: Diagnosis not present

## 2023-08-09 DIAGNOSIS — N390 Urinary tract infection, site not specified: Secondary | ICD-10-CM | POA: Diagnosis not present

## 2023-08-09 DIAGNOSIS — E8809 Other disorders of plasma-protein metabolism, not elsewhere classified: Secondary | ICD-10-CM | POA: Diagnosis not present

## 2023-08-09 LAB — RENAL FUNCTION PANEL
Albumin: <1.5 g/dL — ABNORMAL LOW (ref 3.5–5.0)
Anion gap: 4 — ABNORMAL LOW (ref 5–15)
BUN: 14 mg/dL (ref 6–20)
CO2: 26 mmol/L (ref 22–32)
Calcium: 7.4 mg/dL — ABNORMAL LOW (ref 8.9–10.3)
Chloride: 98 mmol/L (ref 98–111)
Creatinine, Ser: 0.73 mg/dL (ref 0.61–1.24)
GFR, Estimated: >60 mL/min (ref >60)
Glucose, Bld: 90 mg/dL (ref 70–99)
Phosphorus: 3.1 mg/dL (ref 2.5–4.6)
Potassium: 3.9 mmol/L (ref 3.5–5.1)
Sodium: 128 mmol/L — ABNORMAL LOW (ref 135–145)

## 2023-08-09 LAB — CULTURE, BLOOD (ROUTINE X 2)
Culture: NO GROWTH
Special Requests: ADEQUATE

## 2023-08-09 LAB — GLUCOSE, CAPILLARY: Glucose-Capillary: 74 mg/dL (ref 70–99)

## 2023-08-09 LAB — SODIUM: Sodium: 129 mmol/L — ABNORMAL LOW (ref 135–145)

## 2023-08-09 NOTE — TOC Transition Note (Signed)
Transition of Care [facility]) - CM/SW Discharge Note   Patient Details  Name: [name] MRN: [id number] Date of Birth: Oct 04, 1961  Transition of Care [facility]) CM/SW Contact:  Hetty Ely, RN Phone Number: 08/09/2023, 2:15 PM   Clinical Narrative: To discharge to [facility] today via [facility]. TOC barriers resolved. Nurse to call report [phone].    Final next level of care: Skilled Nursing Facility Barriers to Discharge: Barriers Resolved   Patient Goals and CMS Choice      Discharge Placement                Patient chooses bed at: [facility] Patient to be transferred to facility by: [facility]   Patient and family notified of of transfer: 08/09/23  Discharge Plan and Services Additional resources added to the After Visit Summary for                  DME Arranged: N/A DME Agency: NA       HH Arranged: NA HH Agency: NA        Social Determinants of Health (SDOH) Interventions SDOH Screenings   Food Insecurity: No Food Insecurity (08/04/2023)  Housing: Low Risk  (08/04/2023)  Transportation Needs: Unmet Transportation Needs (08/04/2023)  Utilities: Not At Risk (08/04/2023)  Tobacco Use: Low Risk  (08/04/2023)     Readmission Risk Interventions     No data to display

## 2023-08-09 NOTE — Progress Notes (Signed)
OT Cancellation Note  Patient Details Name: CHANDLER ANDERSEN MRN: [id number] DOB: 1961-05-04   Cancelled Treatment:    Reason Eval/Treat Not Completed: Other (comment) (Upon arrival to Pt. room. Pt. waiting nursing care, as EMS arrived to transport Pt. to SNF)  Olegario Messier, MS, OTR/L 08/09/2023, 3:21 PM

## 2023-08-09 NOTE — Progress Notes (Signed)
Attempted to give report to [facility] [phone]). Stated they will call me back. Patient awaiting EMS pickup. Family at bedside.

## 2023-08-09 NOTE — Discharge Summary (Addendum)
Physician Discharge Summary   Patient: [name] MRN: [id number] DOB: 1961/07/11  Admit date:     08/04/2023  Discharge date: 08/09/23  Discharge Physician: Lurene Shadow   PCP: Armando Gang, FNP   Recommendations at discharge:   Follow-up with physician when the labs were within 3 days of discharge  Discharge Diagnoses: Principal Problem:   Severe sepsis (HCC) Active Problems:   UTI (urinary tract infection)   Ulcerative colitis (HCC)   GI bleeding   Acute metabolic encephalopathy   Stroke (HCC)   Slurred speech   Fall at home, initial encounter   AKI (acute kidney injury) (HCC)   Rhabdomyolysis   Myocardial injury   HTN (hypertension)   Abnormal LFTs   HLD (hyperlipidemia)   Alcohol use   Pulmonary nodule   Protein-calorie malnutrition, severe  Resolved Problems:   * No resolved hospital problems. *  Hospital Course:  [name] is a 62 y.o. male with medical history significant for stroke with chronic right hand numbness, ulcerative colitis, GIB, HTN, HLD,  alcohol use disorder, marijuana use disorder, who presented to the hospital with altered mental status, fall and GI bleed.  Reportedly, patient has had intermittent rectal bleeding for over a month.  He drinks beer and liquor almost every day. Apparently, he fell about 3 days prior to admission.  His brother did a wellness check on him and found patient on the ground.     He was admitted to the hospital for severe sepsis secondary to acute UTI, acute metabolic encephalopathy, AKI and rhabdomyolysis.    Assessment and Plan:   Severe sepsis secondary to acute UTI: Urine culture did not show any growth.  Completed 4 days of ciprofloxacin and Flagyl on 08/07/2023     AKI: Resolved.   Creatinine on admission was 4.15.  Creatinine was 1.22 on 07/06/2023 (prior to admission). Chronic hyponatremia: He is asymptomatic.  Outpatient monitoring of sodium level recommended     Ulcerative colitis and GI  bleeding, diarrhea: Diarrhea has improved.  Rectal tube has been removed.  Continue balsalazide at discharge.   No plan for endoscopic workup at this time.  GI has signed off. Outpatient follow-up with gastroenterologist recommended.     S/p fall: PT and OT recommended discharge to SNF     Elevated troponins: Attributed to demand ischemia.     Alcohol use disorder, hypoalbuminemia: Encouraged complete abstinence from alcohol.  Encouraged adequate oral intake.  His girlfriend said he had not drank any alcohol for about a month.  Elevated liver enzymes: This is likely from alcohol use disorder.  Numbers are trending down.     Acute metabolic encephalopathy, rhabdomyolysis: Improved     Other comorbidities include hyperlipidemia, hypertension, history of stroke       Consultants: Gastroenterologist Procedures performed: None Disposition: Skilled nursing facility Diet recommendation:  Discharge Diet Orders (From admission, onward)     Start     Ordered   08/09/23 0000  Diet - low sodium heart healthy        08/09/23 1329           Dysphagia type 1 thin Liquid DISCHARGE MEDICATION: Allergies as of 08/09/2023       Reactions   Penicillins Anaphylaxis   Has patient had a PCN reaction causing immediate rash, facial/tongue/throat swelling, SOB or lightheadedness with hypotension: Yes Has patient had a PCN reaction causing severe rash involving mucus membranes or skin necrosis: No Has patient had a PCN reaction that required  hospitalization No Has patient had a PCN reaction occurring within the last 10 years: No If all of the above answers are "NO", then may proceed with Cephalosporin use.   Cephalosporins Other (See Comments)   Reaction:  Unknown         Medication List     STOP taking these medications    atorvastatin 20 MG tablet Commonly known as: LIPITOR   Linzess 145 MCG Caps capsule Generic drug: linaclotide   metoprolol succinate 25 MG 24 hr  tablet Commonly known as: TOPROL-XL       TAKE these medications    balsalazide 750 MG capsule Commonly known as: COLAZAL Take 750 mg by mouth 3 (three) times daily.   clopidogrel 75 MG tablet Commonly known as: PLAVIX Take 75 mg by mouth daily.   traZODone 100 MG tablet Commonly known as: DESYREL Take 100 mg by mouth at bedtime as needed for sleep.   Vitamin D3 125 MCG (5000 UT) Tabs Take 5,000 Units by mouth daily.               Discharge Care Instructions  (From admission, onward)           Start     Ordered   08/09/23 0000  Discharge wound care:       Comments: Cleanse abrasions to arms and legs with soap and water and pat dry.  Apply mupirocin to skin tears/significant abrasions and cover with silicone foam.  Barrier cream to gluteal folds and perirectal area.   08/09/23 1329            Contact information for after-discharge care     Destination     [facility] .   Service: Skilled Nursing Contact information: [address] [place] [address] [phone]                    Discharge Exam: Ceasar Mons Weights   08/04/23 1532  Weight: 74.4 kg   GEN: NAD SKIN: Warm and dry EYES: No pallor or icterus ENT: MMM CV: RRR PULM: CTA B ABD: soft, ND, NT, +BS CNS: AAO x 3, non focal, slurred speech at baseline EXT: Mild edema of bilateral upper and lower extremities.  No erythema or tenderness   Condition at discharge: stable  The results of significant diagnostics from this hospitalization (including imaging, microbiology, ancillary and laboratory) are listed below for reference.   Imaging Studies: MR BRAIN WO CONTRAST  Result Date: 08/04/2023 CLINICAL DATA:  Acute neurologic deficit. Bilateral lower extremity weakness EXAM: MRI HEAD WITHOUT CONTRAST TECHNIQUE: Multiplanar, multiecho pulse sequences of the brain and surrounding structures were obtained without intravenous contrast. COMPARISON:  08/22/2014  FINDINGS: Brain: No acute infarct, mass effect or extra-axial collection. No acute or chronic hemorrhage. There is confluent hyperintense T2-weighted signal within the white matter. Generalized volume loss. 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 The midline structures are normal. Vascular: Major flow voids are preserved. Skull and upper cervical spine: Normal calvarium and skull base. Visualized upper cervical spine and soft tissues are normal. Sinuses/Orbits:No paranasal sinus fluid levels or advanced mucosal thickening. No mastoid or middle ear effusion. Normal orbits. IMPRESSION: 1. No acute intracranial abnormality. 2. 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 3. 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 Electronically Signed   By: Deatra Robinson M.D.   On: 08/04/2023 22:50   CT CHEST ABDOMEN PELVIS WO CONTRAST  Result Date: 08/04/2023  CLINICAL DATA:  Patient was found down, potentially for 3 days. EXAM: CT CHEST, ABDOMEN AND PELVIS WITHOUT CONTRAST TECHNIQUE: Multidetector CT imaging of the chest, abdomen and pelvis was performed following the standard protocol without IV contrast. RADIATION DOSE REDUCTION: This exam was performed according to the departmental dose-optimization program which includes automated exposure control, adjustment of the mA and/or kV according to patient size and/or use of iterative reconstruction technique. COMPARISON:  Abdomen and pelvis CT 11/22/2015 FINDINGS: CT CHEST FINDINGS Cardiovascular: The heart size is normal. No substantial pericardial effusion. Mild atherosclerotic calcification is noted in the wall of the thoracic aorta. Mediastinum/Nodes: No mediastinal lymphadenopathy. 9 mm posterior right thyroid nodule. Not clinically significant; no follow-up imaging recommended. No evidence for gross hilar lymphadenopathy although assessment is limited by the lack of intravenous  contrast on the current study. The esophagus has normal imaging features. There is no axillary lymphadenopathy. (ref: J Am Coll Radiol. 2015 Feb;12(2): 143-50). Lungs/Pleura: Centrilobular and paraseptal emphysema evident. 3 mm posterior right lower lobe nodule identified on 146/4. 2 mm left upper lobe nodule identified on 60/4. No focal airspace consolidation. No pleural effusion. Musculoskeletal: No worrisome lytic or sclerotic osseous abnormality. CT ABDOMEN PELVIS FINDINGS Hepatobiliary: No suspicious focal abnormality in the liver on this study without intravenous contrast. Increased attenuation in the gallbladder lumen may reflect sludge. No intrahepatic or extrahepatic biliary dilation. Pancreas: No focal mass lesion. No dilatation of the main duct. No intraparenchymal cyst. No peripancreatic edema. Spleen: No splenomegaly. No suspicious focal mass lesion. Adrenals/Urinary Tract: No adrenal nodule or mass. Right kidney unremarkable. 1 mm nonobstructing stone identified interpolar left kidney with another punctate stone in the lower pole left kidney. No evidence for hydroureter. The urinary bladder appears normal for the degree of distention. Stomach/Bowel: Stomach is unremarkable. No gastric wall thickening. No evidence of outlet obstruction. Duodenum is normally positioned as is the ligament of Treitz. No small bowel wall thickening. No small bowel dilatation. The terminal ileum is normal. The appendix is not well visualized, but there is no edema or inflammation in the region of the cecal tip to suggest appendicitis. No gross colonic mass. No colonic wall thickening. Vascular/Lymphatic: There is mild atherosclerotic calcification of the abdominal aorta without aneurysm. There is no gastrohepatic or hepatoduodenal ligament lymphadenopathy. No retroperitoneal or mesenteric lymphadenopathy. Left-sided IVC noted (normal variant). No pelvic sidewall lymphadenopathy. Reproductive: The prostate gland and seminal  vesicles are unremarkable. Other: No intraperitoneal free fluid. Musculoskeletal: No worrisome lytic or sclerotic osseous abnormality. IMPRESSION: 1. No acute findings in the chest, abdomen, or pelvis. Specifically, no acute findings in the chest, abdomen, or pelvis 2. Tiny bilateral pulmonary nodules measuring up to 3 mm. No follow-up needed if patient is low-risk (and has no known or suspected primary neoplasm). Non-contrast chest CT can be considered in 12 months if patient is high-risk. This recommendation follows the consensus statement: Guidelines for Management of Incidental Pulmonary Nodules Detected on CT Images: From the Fleischner Society 2017; Radiology 2017; 284:228-243. 3. Aortic Atherosclerosis (ICD10-I70.0) and Emphysema (ICD10-J43.9). Electronically Signed   By: Kennith Center M.D.   On: 08/04/2023 18:08   DG Knee 1-2 Views Left  Result Date: 08/04/2023 CLINICAL DATA:  Unwitnessed fall.  Left knee bruises. EXAM: LEFT KNEE - 1-2 VIEW COMPARISON:  None Available. FINDINGS: No evidence of fracture, dislocation, or joint effusion. No evidence of arthropathy or other focal bone abnormality. Soft tissues are unremarkable. IMPRESSION: Negative. Electronically Signed   By: Lupita Raider M.D.   On: 08/04/2023  17:41   CT Head Wo Contrast  Result Date: 08/04/2023 CLINICAL DATA:  Neck trauma, intoxicated or obtunded (Age >= 16y); Mental status change, unknown cause. EXAM: CT HEAD WITHOUT CONTRAST CT CERVICAL SPINE WITHOUT CONTRAST TECHNIQUE: Multidetector CT imaging of the head and cervical spine was performed following the standard protocol without intravenous contrast. Multiplanar CT image reconstructions of the cervical spine were also generated. RADIATION DOSE REDUCTION: This exam was performed according to the departmental dose-optimization program which includes automated exposure control, adjustment of the mA and/or kV according to patient size and/or use of iterative reconstruction technique.  COMPARISON:  CT head and cervical spine 08/20/2014. FINDINGS: CT HEAD FINDINGS Brain: Interval extension of encephalomalacia in the bilateral PCA territories with new encephalomalacia in the posterior right frontal lobe, consistent with chronic infarct. Unchanged small area of old infarct in the left middle frontal gyrus. Old lacunar infarcts in the bilateral cerebellar hemispheres no acute hemorrhage, hydrocephalus, extra-axial collection, mass effect or midline shift. Vascular: No hyperdense vessel or unexpected calcification. Skull: No calvarial fracture or suspicious bone lesion. Skull base is unremarkable. Sinuses/Orbits: No acute finding. Other: None. CT CERVICAL SPINE FINDINGS Alignment: Normal. Skull base and vertebrae: No acute fracture. Normal craniocervical junction. No suspicious bone lesions. Soft tissues and spinal canal: No prevertebral fluid or swelling. No visible canal hematoma. Disc levels: Mild cervical spondylosis without high-grade spinal canal stenosis. Upper chest: No acute findings. Other: None. IMPRESSION: 1. No acute intracranial abnormality. 2. Interval extension of encephalomalacia in the bilateral PCA territories with new encephalomalacia in the posterior right frontal lobe, consistent with chronic infarct. 3. No acute cervical spine fracture or traumatic listhesis. Electronically Signed   By: Orvan Falconer M.D.   On: 08/04/2023 17:40   CT Cervical Spine Wo Contrast  Result Date: 08/04/2023 CLINICAL DATA:  Neck trauma, intoxicated or obtunded (Age >= 16y); Mental status change, unknown cause. EXAM: CT HEAD WITHOUT CONTRAST CT CERVICAL SPINE WITHOUT CONTRAST TECHNIQUE: Multidetector CT imaging of the head and cervical spine was performed following the standard protocol without intravenous contrast. Multiplanar CT image reconstructions of the cervical spine were also generated. RADIATION DOSE REDUCTION: This exam was performed according to the departmental dose-optimization program  which includes automated exposure control, adjustment of the mA and/or kV according to patient size and/or use of iterative reconstruction technique. COMPARISON:  CT head and cervical spine 08/20/2014. FINDINGS: CT HEAD FINDINGS Brain: Interval extension of encephalomalacia in the bilateral PCA territories with new encephalomalacia in the posterior right frontal lobe, consistent with chronic infarct. Unchanged small area of old infarct in the left middle frontal gyrus. Old lacunar infarcts in the bilateral cerebellar hemispheres no acute hemorrhage, hydrocephalus, extra-axial collection, mass effect or midline shift. Vascular: No hyperdense vessel or unexpected calcification. Skull: No calvarial fracture or suspicious bone lesion. Skull base is unremarkable. Sinuses/Orbits: No acute finding. Other: None. CT CERVICAL SPINE FINDINGS Alignment: Normal. Skull base and vertebrae: No acute fracture. Normal craniocervical junction. No suspicious bone lesions. Soft tissues and spinal canal: No prevertebral fluid or swelling. No visible canal hematoma. Disc levels: Mild cervical spondylosis without high-grade spinal canal stenosis. Upper chest: No acute findings. Other: None. IMPRESSION: 1. No acute intracranial abnormality. 2. Interval extension of encephalomalacia in the bilateral PCA territories with new encephalomalacia in the posterior right frontal lobe, consistent with chronic infarct. 3. No acute cervical spine fracture or traumatic listhesis. Electronically Signed   By: Orvan Falconer M.D.   On: 08/04/2023 17:40   DG Forearm Right  Result Date:  08/04/2023 CLINICAL DATA:  Unwitnessed fall.  Bruising of right arm. EXAM: RIGHT FOREARM - 2 VIEW COMPARISON:  None Available. FINDINGS: There is no evidence of fracture or other focal bone lesions. Soft tissues are unremarkable. IMPRESSION: Negative. Electronically Signed   By: Lupita Raider M.D.   On: 08/04/2023 17:39   DG Humerus Left  Result Date:  08/04/2023 CLINICAL DATA:  Status post fall with multiple bruises to the upper and lower extremities EXAM: LEFT HUMERUS - 2 VIEW; LEFT FOREARM - 2 VIEW COMPARISON:  None Available. FINDINGS: There is no evidence of fracture or other focal bone lesions. Soft tissues are unremarkable. IMPRESSION: No acute fracture or dislocation. Electronically Signed   By: Agustin Cree M.D.   On: 08/04/2023 17:38   DG Forearm Left  Result Date: 08/04/2023 CLINICAL DATA:  Status post fall with multiple bruises to the upper and lower extremities EXAM: LEFT HUMERUS - 2 VIEW; LEFT FOREARM - 2 VIEW COMPARISON:  None Available. FINDINGS: There is no evidence of fracture or other focal bone lesions. Soft tissues are unremarkable. IMPRESSION: No acute fracture or dislocation. Electronically Signed   By: Agustin Cree M.D.   On: 08/04/2023 17:38   DG Tibia/Fibula Left  Result Date: 08/04/2023 CLINICAL DATA:  Unwitnessed fall.  Bruising of left leg. EXAM: LEFT TIBIA AND FIBULA - 2 VIEW COMPARISON:  None Available. FINDINGS: There is no evidence of fracture or other focal bone lesions. Soft tissues are unremarkable. IMPRESSION: Negative. Electronically Signed   By: Lupita Raider M.D.   On: 08/04/2023 17:38   DG Tibia/Fibula Right  Result Date: 08/04/2023 CLINICAL DATA:  Multifocal bruises and pressure type wounds status post fall. GI bleed. EXAM: RIGHT KNEE - 1-2 VIEW; RIGHT TIBIA AND FIBULA - 2 VIEW COMPARISON:  None Available. FINDINGS: The mineralization and alignment are normal. There is no evidence of acute fracture or dislocation. The joint spaces appear preserved at the ankle and knee. No evidence of joint effusion, foreign body or soft tissue emphysema. IMPRESSION: No acute osseous findings or significant soft tissue abnormalities identified within the right knee or lower leg. Electronically Signed   By: Carey Bullocks M.D.   On: 08/04/2023 17:35   DG Knee 1-2 Views Right  Result Date: 08/04/2023 CLINICAL DATA:  Multifocal  bruises and pressure type wounds status post fall. GI bleed. EXAM: RIGHT KNEE - 1-2 VIEW; RIGHT TIBIA AND FIBULA - 2 VIEW COMPARISON:  None Available. FINDINGS: The mineralization and alignment are normal. There is no evidence of acute fracture or dislocation. The joint spaces appear preserved at the ankle and knee. No evidence of joint effusion, foreign body or soft tissue emphysema. IMPRESSION: No acute osseous findings or significant soft tissue abnormalities identified within the right knee or lower leg. Electronically Signed   By: Carey Bullocks M.D.   On: 08/04/2023 17:35   DG Humerus Right  Result Date: 08/04/2023 CLINICAL DATA:  Larey Seat, bruising EXAM: RIGHT HUMERUS - 2+ VIEW COMPARISON:  12/17/2013 FINDINGS: Frontal and lateral views of the right humerus are obtained. No acute displaced fracture. Alignment of the right shoulder and elbow is anatomic. Mild acromioclavicular and glenohumeral joint osteoarthritis. Soft tissues are unremarkable. IMPRESSION: 1. 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 2. No acute fracture. Electronically Signed   By: Sharlet Salina M.D.   On: 08/04/2023 17:32   DG Chest Port 1 View  Result Date: 08/04/2023 CLINICAL DATA:  Short of breath, gastrointestinal bleeding EXAM: PORTABLE CHEST 1 VIEW COMPARISON:  08/20/2014 FINDINGS: 2 frontal views of the chest demonstrate an unremarkable cardiac silhouette. No acute airspace disease, effusion, or pneumothorax. No acute bony abnormality. IMPRESSION: 1. No acute intrathoracic process. Electronically Signed   By: Sharlet Salina M.D.   On: 08/04/2023 16:36    Microbiology: Results for orders placed or performed during the hospital encounter of 08/04/23  Culture, blood (Routine x 2)     Status: None   Collection Time: 08/04/23  3:39 PM   Specimen: BLOOD  Result Value Ref Range Status   Specimen Description BLOOD BLOOD RIGHT HAND  Final   Special Requests   Final    BOTTLES DRAWN AEROBIC AND ANAEROBIC Blood Culture  adequate volume   Culture   Final    NO GROWTH 5 DAYS Performed at [facility], [address] [place], [place] [address]    Report Status 08/09/2023 FINAL  Final  Culture, blood (Routine x 2)     Status: Abnormal   Collection Time: 08/04/23  3:40 PM   Specimen: BLOOD  Result Value Ref Range Status   Specimen Description   Final    BLOOD RIGHT ANTECUBITAL Performed at [facility], [address], [place], [place] [address]    Special Requests   Final    BOTTLES DRAWN AEROBIC AND ANAEROBIC Blood Culture adequate volume Performed at [facility], [address], [place], [place] [address]    Culture  Setup Time   Final    GRAM POSITIVE COCCI ANAEROBIC BOTTLE ONLY Organism ID to follow CRITICAL RESULT CALLED TO, READ BACK BY AND VERIFIED WITH: CAROLINE COULTER 1425 08/05/23 MU Performed at [facility], [address] [place], [place] [address]    Culture (A)  Final    STAPHYLOCOCCUS EPIDERMIDIS THE SIGNIFICANCE OF ISOLATING THIS ORGANISM FROM A SINGLE SET OF BLOOD CULTURES WHEN MULTIPLE SETS ARE DRAWN IS UNCERTAIN. PLEASE NOTIFY THE MICROBIOLOGY DEPARTMENT WITHIN ONE WEEK IF SPECIATION AND SENSITIVITIES ARE REQUIRED. Performed at [facility], [address], [place], [place] [address]    Report Status 08/07/2023 FINAL  Final  Blood Culture ID Panel (Reflexed)     Status: Abnormal   Collection Time: 08/04/23  3:40 PM  Result Value Ref Range Status   Enterococcus faecalis NOT DETECTED NOT DETECTED Final   Enterococcus Faecium NOT DETECTED NOT DETECTED Final   Listeria monocytogenes NOT DETECTED NOT DETECTED Final   Staphylococcus species DETECTED (A) NOT DETECTED Final    Comment: CRITICAL RESULT CALLED TO, READ BACK BY AND VERIFIED WITH: CAROLINE COULTER 1424 08/05/23 MU    Staphylococcus aureus (BCID) NOT DETECTED NOT DETECTED Final   Staphylococcus epidermidis DETECTED (A) NOT DETECTED Final    Comment: CRITICAL RESULT CALLED  TO, READ BACK BY AND VERIFIED WITH: CAROLINE COULTER 1424 08/05/23 MU    Staphylococcus lugdunensis NOT DETECTED NOT DETECTED Final   Streptococcus species NOT DETECTED NOT DETECTED Final   Streptococcus agalactiae NOT DETECTED NOT DETECTED Final   Streptococcus pneumoniae NOT DETECTED NOT DETECTED Final   Streptococcus pyogenes NOT DETECTED NOT DETECTED Final   A.calcoaceticus-baumannii NOT DETECTED NOT DETECTED Final   Bacteroides fragilis NOT DETECTED NOT DETECTED Final   Enterobacterales NOT DETECTED NOT DETECTED Final   Enterobacter cloacae complex NOT DETECTED NOT DETECTED Final   Escherichia coli NOT DETECTED NOT DETECTED Final   Klebsiella aerogenes NOT DETECTED NOT DETECTED Final   Klebsiella oxytoca NOT DETECTED NOT DETECTED Final   Klebsiella pneumoniae NOT DETECTED NOT DETECTED Final   Proteus  species NOT DETECTED NOT DETECTED Final   Salmonella species NOT DETECTED NOT DETECTED Final   Serratia marcescens NOT DETECTED NOT DETECTED Final   Haemophilus influenzae NOT DETECTED NOT DETECTED Final   Neisseria meningitidis NOT DETECTED NOT DETECTED Final   Pseudomonas aeruginosa NOT DETECTED NOT DETECTED Final   Stenotrophomonas maltophilia NOT DETECTED NOT DETECTED Final   Candida albicans NOT DETECTED NOT DETECTED Final   Candida auris NOT DETECTED NOT DETECTED Final   Candida glabrata NOT DETECTED NOT DETECTED Final   Candida krusei NOT DETECTED NOT DETECTED Final   Candida parapsilosis NOT DETECTED NOT DETECTED Final   Candida tropicalis NOT DETECTED NOT DETECTED Final   Cryptococcus neoformans/gattii NOT DETECTED NOT DETECTED Final   Methicillin resistance mecA/C NOT DETECTED NOT DETECTED Final    Comment: Performed at [facility], [address], [place], [place] [address]  Urine Culture     Status: None   Collection Time: 08/04/23  6:31 PM   Specimen: Urine, Random  Result Value Ref Range Status   Specimen Description   Final    URINE, RANDOM Performed at  [facility], [address] [place], [place] [address]    Special Requests   Final    NONE Reflexed from [id number] Performed at [facility], [address] [place], [place] [address]    Culture   Final    NO GROWTH Performed at [facility], [address], [place], [place] [address]    Report Status 08/06/2023 FINAL  Final  C Difficile Quick Screen w PCR reflex     Status: None   Collection Time: 08/05/23  2:10 AM   Specimen: STOOL  Result Value Ref Range Status   C Diff antigen NEGATIVE NEGATIVE Final   C Diff toxin NEGATIVE NEGATIVE Final   C Diff interpretation No C. difficile detected.  Final    Comment: Performed at [facility], [address] [place], [place] [address]  Gastrointestinal Panel by PCR , Stool     Status: None   Collection Time: 08/05/23  9:30 PM   Specimen: Rectum; Stool  Result Value Ref Range Status   Campylobacter species NOT DETECTED NOT DETECTED Final   Plesimonas shigelloides NOT DETECTED NOT DETECTED Final   Salmonella species NOT DETECTED NOT DETECTED Final   Yersinia enterocolitica NOT DETECTED NOT DETECTED Final   Vibrio species NOT DETECTED NOT DETECTED Final   Vibrio cholerae NOT DETECTED NOT DETECTED Final   Enteroaggregative E coli (EAEC) NOT DETECTED NOT DETECTED Final   Enteropathogenic E coli (EPEC) NOT DETECTED NOT DETECTED Final   Enterotoxigenic E coli (ETEC) NOT DETECTED NOT DETECTED Final   Shiga like toxin producing E coli (STEC) NOT DETECTED NOT DETECTED Final   Shigella/Enteroinvasive E coli (EIEC) NOT DETECTED NOT DETECTED Final   Cryptosporidium NOT DETECTED NOT DETECTED Final   Cyclospora cayetanensis NOT DETECTED NOT DETECTED Final   Entamoeba histolytica NOT DETECTED NOT DETECTED Final   Giardia lamblia NOT DETECTED NOT DETECTED Final   Adenovirus F40/41 NOT DETECTED NOT DETECTED Final   Astrovirus NOT DETECTED NOT DETECTED Final   Norovirus GI/GII NOT DETECTED NOT DETECTED Final    Rotavirus A NOT DETECTED NOT DETECTED Final   Sapovirus (I, II, IV, and V) NOT DETECTED NOT DETECTED Final    Comment: Performed at [facility], [address] [place], [place] [address]    Labs: CBC: Recent Labs  Lab 08/04/23 1538 08/04/23 1936 08/05/23 0104 08/05/23 0272 08/06/23  7829 08/07/23 0320 08/08/23 0526  WBC 18.5*   < > 13.9* 11.3* 10.4 12.3* 9.8  NEUTROABS 16.2*  --   --   --  8.1* 9.8* 6.9  HGB 14.3   < > 12.5* 11.8* 11.1* 11.9* 11.5*  HCT 42.3   < > 36.0* 33.6* 32.1* 33.9* 33.2*  MCV 84.3   < > 83.3 82.6 83.6 82.3 84.7  PLT 361   < > 257 221 187 218 208   < > = values in this interval not displayed.   Basic Metabolic Panel: Recent Labs  Lab 08/04/23 1539 08/05/23 0648 08/06/23 0459 08/07/23 0320 08/08/23 0526 08/09/23 0510 08/09/23 1203  NA  --  129* 130* 130* 131* 128* 129*  K  --  3.9 4.1 4.1 4.0 3.9  --   CL  --  103 103 99 101 98  --   CO2  --  21* 23 23 24 26   --   GLUCOSE  --  97 90 109* 97 90  --   BUN  --  47* 36* 28* 21* 14  --   CREATININE  --  3.12* 1.87* 1.37* 0.95 0.73  --   CALCIUM  --  7.5* 7.5* 7.7* 8.7* 7.4*  --   MG 2.6*  --   --  2.3 2.0  --   --   PHOS  --   --   --  2.3* 2.3* 3.1  --    Liver Function Tests: Recent Labs  Lab 08/04/23 1538 08/05/23 0648 08/06/23 0459 08/07/23 0320 08/08/23 0526 08/09/23 0510  AST 154* 126* 117* 99* 84*  --   ALT 111* 95* 92* 91* 86*  --   ALKPHOS 51 40 39 46 44  --   BILITOT 0.8 0.7 0.6 0.5 0.2*  --   PROT 5.4* 4.3* 4.2* 4.7* 4.4*  --   ALBUMIN 2.4* 1.8* 1.8* 1.9* 1.9* <1.5*   CBG: Recent Labs  Lab 08/05/23 2054 08/06/23 0805 08/08/23 0848 08/09/23 1003  GLUCAP 109* 83 82 74    Discharge time spent: greater than 30 minutes.  Signed: Lurene Shadow, MD [facility] 08/09/2023

## 2023-08-09 NOTE — Progress Notes (Signed)
Speech Language Pathology Treatment: Dysphagia  Patient Details Name: [name] MRN: [id number] DOB: [birth year] Today's Date: 08/09/2023 Time: 5956-3875 SLP Time Calculation (min) (ACUTE ONLY): 35 min  Assessment / Plan / Recommendation Clinical Impression  Pt seen for ongoing assessment of swallowing today; toleration of the Dysphagia level 1 w/ thin liquids. Pt was alert, verbally responsive and engaged in basic/simple conversation w/ this SLP, but Cognitive decline was apparent in some of his answers and follow through w/ instructions. Unsure of his Baseline Cognition in setting of ETOH use/abuse. During verbal discussion(no po's), wet vocal quality was noted x1-2 prior to more po's -- suspect Phlegm from impact from ETOH use/abuse and Esophageal phase dysmotility.  Pt is on RA; wbc wnl. Poor Dentition status/missing dentition.   Pt explained general aspiration precautions and agreed verbally to the need for following them especially sitting upright for all oral intake -- supported behind the back for full upright sitting. Pt assisted w/ positioning d/t min weakness, then w/ holding Cup to drink (he first stated "I can't"). No oral phase deficits were noted w/ thin liquids and purees -- timely bolus management and complete oral clearing noted. W/ trials of thin liquids, No consistent, overt clinical s/s of aspiration noted w/ thin liquids trials via Cup but min wet vocal quality was noted x1 each post swallow of liquids then puree when pt spoke immediately post swallowing -- pt was often IMPULSIVE in his actions and verbalizations. He needed bolus amount monitoring and encouraged him NOT TO TALK w/ the eating/drinking -- reduced insight apparent. Pt's respiratory status remained calm and unlabored, vocal quality clear b/t trials when not talking immediately after po trials.  Pt appears at risk for Esophageal phase Dysmotility d/t chronic health and ETOH use/abuse issues. ANY Esophageal phase  dysmotility can impact the pharyngeal phase of swallowing and increase risk for aspiration of REFLUX material.   Pt appears at reduced risk for aspriation when following aspiration precautions AND using a modified diet of Puree foods(oral phase deficits and Dentition status decline), thin liquids w/ SUPERVISION d/t Impulsive drinking, Cognitive decline and decreased awareness/insight.   Recommend continue a PUREED diet for ease of soft foods w/ gravies added to moisten foods; Thin liquids -- Cup drinking may be safest -- monitor straw use. Recommend aspiration precautions; Pills CRUSHED vs WHOLE in Puree; tray setup and positioning Upright/forward assistance for meals and all oral intake. Supervision and Feeding Support at meals. REFLUX precautions d/t pt's baseline.   ST services will monitor pt's toleration of diet and provide education on aspiration precautions while admitted next 2-3 days; pt stated he liked the "smoother" foods in the diet currently. Trials to upgrade food/diet as indicated. Pt updated but unsure of his full insight into his medical status/needs. MD/NSG updated. Precautions posted at bedside. Recommend Dietician f/u d/t poor po intake in general.    HPI HPI: per admitting H & P "[name] is a 62 y.o. male with medical history significant of stroke with chronic right hand numbness, ulcerative colitis, GIB, HTN, HLD, alcohol use, marijuana abuse, who presents with altered mental status, fall and GI bleeding.     Patient has AMS, and is unable to provide accurate medical history, therefore, most of the history is obtained by discussing the case with ED physician, per EMS report, and with the nursing staff. I also called his girlfriend by phone who provided some medical history.     Per her girlfriend, patient has history of stroke with chronic  right hand numbness.  He had slurred speech when he had stroke in the past which has resolved.  Girlfriend states that patient has  intermittent rectal bleeding with dark stool for more than a month.  At her normal baseline, patient is alert and orientated x 3.  Girlfriend states that patient drinks beer and liquor almost every day.     Per EDP, pt had fall 3 days ago and his brother had not heard from pt for 3 days so he went over to check on him, and found pt on the ground. Last known well was approximately 3 days ago.  Patient has generalized weakness, is unable to get up or ambulate. Pt was covered in bloody stool. He has multiple skin bruises in his right cheek, bilateral knees, both forearms.  He has several small skin tear in left forearm.     When I saw pt in ED, patient is alert, but confused.  He knows his own name, not orientated to the time and place.  He has symmetrical weakness in both legs, moves his arms normally.  He has slurred speech which is new.  No facial droop noted.  No active nausea, vomiting, respiratory distress, cough or shortness of breath noted.  Does not seem to have chest pain and abdominal pain.".   MRI: No acute intracranial abnormality.  2. 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  3. 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   CT of Chest: Centrilobular and paraseptal emphysema evident. 3 mm  posterior right lower lobe nodule identified on 146/4. 2 mm left  upper lobe nodule identified on 60/4. No focal airspace  consolidation. No pleural effusion.      SLP Plan  Continue with current plan of care      Recommendations for follow up therapy are one component of a multi-disciplinary discharge planning process, led by the attending physician.  Recommendations may be updated based on patient status, additional functional criteria and insurance authorization.    Recommendations  Diet recommendations: Dysphagia 1 (puree);Thin liquid Liquids provided via: Cup;Straw (monitor for impulsive drinking) Medication Administration: Crushed with puree (as able) Supervision: Patient  able to self feed;Staff to assist with self feeding;Full supervision/cueing for compensatory strategies Compensations: Minimize environmental distractions;Slow rate;Small sips/bites;Lingual sweep for clearance of pocketing;Multiple dry swallows after each bite/sip;Follow solids with liquid Postural Changes and/or Swallow Maneuvers: Out of bed for meals;Seated upright 90 degrees;Upright 30-60 min after meal (REFLUX precs)                 (Dietician; Palliative Care) Oral care BID;Oral care before and after PO;Staff/trained caregiver to provide oral care   Frequent or constant Supervision/Assistance Dysphagia, oropharyngeal phase (R13.12) (ETOH abuse/use; unsure of Cognitive status baseline)     Continue with current plan of care       Jerilynn Som, MS, CCC-SLP Speech Language Pathologist Rehab Services; [facility] [place] [phone] (ascom) Janett Kamath  08/09/2023, 12:06 PM

## 2023-08-09 NOTE — Plan of Care (Signed)

## 2023-08-11 DIAGNOSIS — R5381 Other malaise: Secondary | ICD-10-CM | POA: Diagnosis not present

## 2023-08-12 DIAGNOSIS — K5190 Ulcerative colitis, unspecified, without complications: Secondary | ICD-10-CM | POA: Diagnosis not present

## 2023-08-12 DIAGNOSIS — N390 Urinary tract infection, site not specified: Secondary | ICD-10-CM | POA: Diagnosis not present

## 2023-08-12 DIAGNOSIS — K922 Gastrointestinal hemorrhage, unspecified: Secondary | ICD-10-CM | POA: Diagnosis not present

## 2023-08-12 DIAGNOSIS — Z8673 Personal history of transient ischemic attack (TIA), and cerebral infarction without residual deficits: Secondary | ICD-10-CM | POA: Diagnosis not present

## 2023-08-12 DIAGNOSIS — N179 Acute kidney failure, unspecified: Secondary | ICD-10-CM | POA: Diagnosis not present

## 2023-08-12 DIAGNOSIS — G9341 Metabolic encephalopathy: Secondary | ICD-10-CM | POA: Diagnosis not present

## 2023-08-12 DIAGNOSIS — E785 Hyperlipidemia, unspecified: Secondary | ICD-10-CM | POA: Diagnosis not present

## 2023-08-12 DIAGNOSIS — E43 Unspecified severe protein-calorie malnutrition: Secondary | ICD-10-CM | POA: Diagnosis not present

## 2023-08-12 DIAGNOSIS — M6282 Rhabdomyolysis: Secondary | ICD-10-CM | POA: Diagnosis not present

## 2023-08-13 DIAGNOSIS — E43 Unspecified severe protein-calorie malnutrition: Secondary | ICD-10-CM | POA: Diagnosis not present

## 2023-08-13 DIAGNOSIS — E559 Vitamin D deficiency, unspecified: Secondary | ICD-10-CM | POA: Diagnosis not present

## 2023-08-13 DIAGNOSIS — E785 Hyperlipidemia, unspecified: Secondary | ICD-10-CM | POA: Diagnosis not present

## 2023-08-13 DIAGNOSIS — I10 Essential (primary) hypertension: Secondary | ICD-10-CM | POA: Diagnosis not present

## 2023-08-13 DIAGNOSIS — M2550 Pain in unspecified joint: Secondary | ICD-10-CM | POA: Diagnosis not present

## 2023-08-14 DIAGNOSIS — E43 Unspecified severe protein-calorie malnutrition: Secondary | ICD-10-CM | POA: Diagnosis not present

## 2023-08-14 DIAGNOSIS — L89150 Pressure ulcer of sacral region, unstageable: Secondary | ICD-10-CM | POA: Diagnosis not present

## 2023-08-14 DIAGNOSIS — K5190 Ulcerative colitis, unspecified, without complications: Secondary | ICD-10-CM | POA: Diagnosis not present

## 2023-08-14 DIAGNOSIS — K922 Gastrointestinal hemorrhage, unspecified: Secondary | ICD-10-CM | POA: Diagnosis not present

## 2023-08-15 DIAGNOSIS — E43 Unspecified severe protein-calorie malnutrition: Secondary | ICD-10-CM | POA: Diagnosis not present

## 2023-08-15 DIAGNOSIS — M2550 Pain in unspecified joint: Secondary | ICD-10-CM | POA: Diagnosis not present

## 2023-08-15 DIAGNOSIS — E8809 Other disorders of plasma-protein metabolism, not elsewhere classified: Secondary | ICD-10-CM | POA: Diagnosis not present

## 2023-08-15 DIAGNOSIS — E871 Hypo-osmolality and hyponatremia: Secondary | ICD-10-CM | POA: Diagnosis not present

## 2023-08-15 DIAGNOSIS — E785 Hyperlipidemia, unspecified: Secondary | ICD-10-CM | POA: Diagnosis not present

## 2023-08-18 DIAGNOSIS — E785 Hyperlipidemia, unspecified: Secondary | ICD-10-CM | POA: Diagnosis not present

## 2023-08-18 DIAGNOSIS — Z8673 Personal history of transient ischemic attack (TIA), and cerebral infarction without residual deficits: Secondary | ICD-10-CM | POA: Diagnosis not present

## 2023-08-18 DIAGNOSIS — M7989 Other specified soft tissue disorders: Secondary | ICD-10-CM | POA: Diagnosis not present

## 2023-08-18 DIAGNOSIS — E43 Unspecified severe protein-calorie malnutrition: Secondary | ICD-10-CM | POA: Diagnosis not present

## 2023-08-19 DIAGNOSIS — N390 Urinary tract infection, site not specified: Secondary | ICD-10-CM | POA: Diagnosis not present

## 2023-08-19 DIAGNOSIS — R300 Dysuria: Secondary | ICD-10-CM | POA: Diagnosis not present

## 2023-08-20 DIAGNOSIS — N390 Urinary tract infection, site not specified: Secondary | ICD-10-CM | POA: Diagnosis not present

## 2023-08-21 DIAGNOSIS — M533 Sacrococcygeal disorders, not elsewhere classified: Secondary | ICD-10-CM | POA: Diagnosis not present

## 2023-08-21 DIAGNOSIS — S31000A Unspecified open wound of lower back and pelvis without penetration into retroperitoneum, initial encounter: Secondary | ICD-10-CM | POA: Diagnosis not present

## 2023-08-21 DIAGNOSIS — L89150 Pressure ulcer of sacral region, unstageable: Secondary | ICD-10-CM | POA: Diagnosis not present

## 2023-08-21 DIAGNOSIS — K922 Gastrointestinal hemorrhage, unspecified: Secondary | ICD-10-CM | POA: Diagnosis not present

## 2023-08-21 DIAGNOSIS — E43 Unspecified severe protein-calorie malnutrition: Secondary | ICD-10-CM | POA: Diagnosis not present

## 2023-08-21 DIAGNOSIS — K5190 Ulcerative colitis, unspecified, without complications: Secondary | ICD-10-CM | POA: Diagnosis not present

## 2023-08-22 DIAGNOSIS — E785 Hyperlipidemia, unspecified: Secondary | ICD-10-CM | POA: Diagnosis not present

## 2023-08-22 DIAGNOSIS — E43 Unspecified severe protein-calorie malnutrition: Secondary | ICD-10-CM | POA: Diagnosis not present

## 2023-08-23 DIAGNOSIS — E7800 Pure hypercholesterolemia, unspecified: Secondary | ICD-10-CM | POA: Diagnosis not present

## 2023-08-23 DIAGNOSIS — I10 Essential (primary) hypertension: Secondary | ICD-10-CM | POA: Diagnosis not present

## 2023-08-23 DIAGNOSIS — R51 Headache with orthostatic component, not elsewhere classified: Secondary | ICD-10-CM | POA: Diagnosis not present

## 2023-08-23 DIAGNOSIS — R601 Generalized edema: Secondary | ICD-10-CM | POA: Diagnosis not present

## 2023-08-24 ENCOUNTER — Emergency Department: Payer: 59

## 2023-08-24 ENCOUNTER — Inpatient Hospital Stay
Admission: EM | Admit: 2023-08-24 | Discharge: 2023-08-28 | DRG: 175 | Disposition: A | Discharge status: Skilled Nursing Facility | Payer: 59 | Attending: Internal Medicine | Admitting: Internal Medicine

## 2023-08-24 ENCOUNTER — Other Ambulatory Visit: Payer: Self-pay

## 2023-08-24 DIAGNOSIS — S01411D Laceration without foreign body of right cheek and temporomandibular area, subsequent encounter: Secondary | ICD-10-CM | POA: Diagnosis not present

## 2023-08-24 DIAGNOSIS — R278 Other lack of coordination: Secondary | ICD-10-CM | POA: Diagnosis not present

## 2023-08-24 DIAGNOSIS — Z881 Allergy status to other antibiotic agents status: Secondary | ICD-10-CM

## 2023-08-24 DIAGNOSIS — Z8249 Family history of ischemic heart disease and other diseases of the circulatory system: Secondary | ICD-10-CM | POA: Diagnosis not present

## 2023-08-24 DIAGNOSIS — I82C12 Acute embolism and thrombosis of left internal jugular vein: Secondary | ICD-10-CM | POA: Diagnosis not present

## 2023-08-24 DIAGNOSIS — N179 Acute kidney failure, unspecified: Secondary | ICD-10-CM | POA: Diagnosis not present

## 2023-08-24 DIAGNOSIS — Z743 Need for continuous supervision: Secondary | ICD-10-CM | POA: Diagnosis not present

## 2023-08-24 DIAGNOSIS — I82452 Acute embolism and thrombosis of left peroneal vein: Secondary | ICD-10-CM | POA: Diagnosis not present

## 2023-08-24 DIAGNOSIS — L8990 Pressure ulcer of unspecified site, unspecified stage: Secondary | ICD-10-CM | POA: Diagnosis not present

## 2023-08-24 DIAGNOSIS — L89316 Pressure-induced deep tissue damage of right buttock: Secondary | ICD-10-CM | POA: Diagnosis not present

## 2023-08-24 DIAGNOSIS — K5190 Ulcerative colitis, unspecified, without complications: Secondary | ICD-10-CM | POA: Diagnosis present

## 2023-08-24 DIAGNOSIS — A419 Sepsis, unspecified organism: Secondary | ICD-10-CM | POA: Diagnosis not present

## 2023-08-24 DIAGNOSIS — I8290 Acute embolism and thrombosis of unspecified vein: Secondary | ICD-10-CM | POA: Diagnosis not present

## 2023-08-24 DIAGNOSIS — D6859 Other primary thrombophilia: Secondary | ICD-10-CM | POA: Diagnosis not present

## 2023-08-24 DIAGNOSIS — I82451 Acute embolism and thrombosis of right peroneal vein: Secondary | ICD-10-CM | POA: Diagnosis present

## 2023-08-24 DIAGNOSIS — I639 Cerebral infarction, unspecified: Secondary | ICD-10-CM | POA: Diagnosis present

## 2023-08-24 DIAGNOSIS — I5A Non-ischemic myocardial injury (non-traumatic): Secondary | ICD-10-CM | POA: Diagnosis not present

## 2023-08-24 DIAGNOSIS — I82611 Acute embolism and thrombosis of superficial veins of right upper extremity: Secondary | ICD-10-CM | POA: Diagnosis not present

## 2023-08-24 DIAGNOSIS — I82412 Acute embolism and thrombosis of left femoral vein: Secondary | ICD-10-CM | POA: Diagnosis not present

## 2023-08-24 DIAGNOSIS — N390 Urinary tract infection, site not specified: Secondary | ICD-10-CM | POA: Diagnosis not present

## 2023-08-24 DIAGNOSIS — R6510 Systemic inflammatory response syndrome (SIRS) of non-infectious origin without acute organ dysfunction: Secondary | ICD-10-CM | POA: Diagnosis not present

## 2023-08-24 DIAGNOSIS — R935 Abnormal findings on diagnostic imaging of other abdominal regions, including retroperitoneum: Secondary | ICD-10-CM | POA: Diagnosis not present

## 2023-08-24 DIAGNOSIS — E785 Hyperlipidemia, unspecified: Secondary | ICD-10-CM | POA: Diagnosis not present

## 2023-08-24 DIAGNOSIS — Z7902 Long term (current) use of antithrombotics/antiplatelets: Secondary | ICD-10-CM | POA: Diagnosis not present

## 2023-08-24 DIAGNOSIS — Z5982 Transportation insecurity: Secondary | ICD-10-CM

## 2023-08-24 DIAGNOSIS — Z833 Family history of diabetes mellitus: Secondary | ICD-10-CM

## 2023-08-24 DIAGNOSIS — R531 Weakness: Secondary | ICD-10-CM | POA: Diagnosis not present

## 2023-08-24 DIAGNOSIS — Z8619 Personal history of other infectious and parasitic diseases: Secondary | ICD-10-CM

## 2023-08-24 DIAGNOSIS — M79602 Pain in left arm: Secondary | ICD-10-CM | POA: Diagnosis not present

## 2023-08-24 DIAGNOSIS — Z8673 Personal history of transient ischemic attack (TIA), and cerebral infarction without residual deficits: Secondary | ICD-10-CM

## 2023-08-24 DIAGNOSIS — I82442 Acute embolism and thrombosis of left tibial vein: Secondary | ICD-10-CM | POA: Diagnosis present

## 2023-08-24 DIAGNOSIS — I2693 Single subsegmental pulmonary embolism without acute cor pulmonale: Secondary | ICD-10-CM

## 2023-08-24 DIAGNOSIS — E782 Mixed hyperlipidemia: Secondary | ICD-10-CM | POA: Diagnosis not present

## 2023-08-24 DIAGNOSIS — L89326 Pressure-induced deep tissue damage of left buttock: Secondary | ICD-10-CM | POA: Diagnosis present

## 2023-08-24 DIAGNOSIS — K51918 Ulcerative colitis, unspecified with other complication: Secondary | ICD-10-CM | POA: Diagnosis not present

## 2023-08-24 DIAGNOSIS — K922 Gastrointestinal hemorrhage, unspecified: Secondary | ICD-10-CM | POA: Diagnosis not present

## 2023-08-24 DIAGNOSIS — E43 Unspecified severe protein-calorie malnutrition: Secondary | ICD-10-CM | POA: Diagnosis not present

## 2023-08-24 DIAGNOSIS — I82409 Acute embolism and thrombosis of unspecified deep veins of unspecified lower extremity: Secondary | ICD-10-CM | POA: Diagnosis present

## 2023-08-24 DIAGNOSIS — Z789 Other specified health status: Secondary | ICD-10-CM | POA: Diagnosis not present

## 2023-08-24 DIAGNOSIS — I10 Essential (primary) hypertension: Secondary | ICD-10-CM | POA: Diagnosis present

## 2023-08-24 DIAGNOSIS — Z87892 Personal history of anaphylaxis: Secondary | ICD-10-CM

## 2023-08-24 DIAGNOSIS — S51011D Laceration without foreign body of right elbow, subsequent encounter: Secondary | ICD-10-CM | POA: Diagnosis not present

## 2023-08-24 DIAGNOSIS — Z79899 Other long term (current) drug therapy: Secondary | ICD-10-CM | POA: Diagnosis not present

## 2023-08-24 DIAGNOSIS — G9341 Metabolic encephalopathy: Secondary | ICD-10-CM | POA: Diagnosis not present

## 2023-08-24 DIAGNOSIS — Z880 Allergy status to penicillin: Secondary | ICD-10-CM

## 2023-08-24 DIAGNOSIS — Z803 Family history of malignant neoplasm of breast: Secondary | ICD-10-CM

## 2023-08-24 DIAGNOSIS — K51919 Ulcerative colitis, unspecified with unspecified complications: Secondary | ICD-10-CM

## 2023-08-24 DIAGNOSIS — F1010 Alcohol abuse, uncomplicated: Secondary | ICD-10-CM | POA: Diagnosis present

## 2023-08-24 DIAGNOSIS — M6281 Muscle weakness (generalized): Secondary | ICD-10-CM | POA: Diagnosis not present

## 2023-08-24 DIAGNOSIS — Z87440 Personal history of urinary (tract) infections: Secondary | ICD-10-CM

## 2023-08-24 DIAGNOSIS — Z8719 Personal history of other diseases of the digestive system: Secondary | ICD-10-CM

## 2023-08-24 DIAGNOSIS — I82432 Acute embolism and thrombosis of left popliteal vein: Secondary | ICD-10-CM | POA: Diagnosis not present

## 2023-08-24 DIAGNOSIS — L89150 Pressure ulcer of sacral region, unstageable: Secondary | ICD-10-CM | POA: Diagnosis present

## 2023-08-24 DIAGNOSIS — I2699 Other pulmonary embolism without acute cor pulmonale: Principal | ICD-10-CM | POA: Diagnosis present

## 2023-08-24 DIAGNOSIS — E871 Hypo-osmolality and hyponatremia: Secondary | ICD-10-CM | POA: Diagnosis not present

## 2023-08-24 DIAGNOSIS — R1312 Dysphagia, oropharyngeal phase: Secondary | ICD-10-CM | POA: Diagnosis not present

## 2023-08-24 DIAGNOSIS — I82623 Acute embolism and thrombosis of deep veins of upper extremity, bilateral: Secondary | ICD-10-CM | POA: Diagnosis not present

## 2023-08-24 DIAGNOSIS — I82403 Acute embolism and thrombosis of unspecified deep veins of lower extremity, bilateral: Secondary | ICD-10-CM | POA: Diagnosis not present

## 2023-08-24 DIAGNOSIS — M7989 Other specified soft tissue disorders: Secondary | ICD-10-CM | POA: Diagnosis not present

## 2023-08-24 DIAGNOSIS — I82441 Acute embolism and thrombosis of right tibial vein: Secondary | ICD-10-CM | POA: Diagnosis not present

## 2023-08-24 DIAGNOSIS — M6282 Rhabdomyolysis: Secondary | ICD-10-CM | POA: Diagnosis not present

## 2023-08-24 DIAGNOSIS — R911 Solitary pulmonary nodule: Secondary | ICD-10-CM | POA: Diagnosis not present

## 2023-08-24 DIAGNOSIS — J189 Pneumonia, unspecified organism: Secondary | ICD-10-CM | POA: Diagnosis present

## 2023-08-24 DIAGNOSIS — I82402 Acute embolism and thrombosis of unspecified deep veins of left lower extremity: Secondary | ICD-10-CM | POA: Diagnosis not present

## 2023-08-24 LAB — CBC WITH DIFFERENTIAL/PLATELET
Abs Immature Granulocytes: 0.11 10*3/uL — ABNORMAL HIGH (ref 0.00–0.07)
Basophils Absolute: 0 10*3/uL (ref 0.0–0.1)
Basophils Relative: 1 %
Eosinophils Absolute: 0.1 10*3/uL (ref 0.0–0.5)
Eosinophils Relative: 2 %
HCT: 35.2 % — ABNORMAL LOW (ref 39.0–52.0)
Hemoglobin: 11.5 g/dL — ABNORMAL LOW (ref 13.0–17.0)
Immature Granulocytes: 2 %
Lymphocytes Relative: 29 %
Lymphs Abs: 1.7 10*3/uL (ref 0.7–4.0)
MCH: 28.8 pg (ref 26.0–34.0)
MCHC: 32.7 g/dL (ref 30.0–36.0)
MCV: 88 fL (ref 80.0–100.0)
Monocytes Absolute: 0.8 10*3/uL (ref 0.1–1.0)
Monocytes Relative: 13 %
Neutro Abs: 3.2 10*3/uL (ref 1.7–7.7)
Neutrophils Relative %: 53 %
Platelets: 443 10*3/uL — ABNORMAL HIGH (ref 150–400)
RBC: 4 MIL/uL — ABNORMAL LOW (ref 4.22–5.81)
RDW: 15.4 % (ref 11.5–15.5)
WBC: 5.9 10*3/uL (ref 4.0–10.5)
nRBC: 0 % (ref 0.0–0.2)

## 2023-08-24 LAB — COMPREHENSIVE METABOLIC PANEL
ALT: 17 U/L (ref 0–44)
AST: 15 U/L (ref 15–41)
Albumin: 2 g/dL — ABNORMAL LOW (ref 3.5–5.0)
Alkaline Phosphatase: 61 U/L (ref 38–126)
Anion gap: 9 (ref 5–15)
BUN: 5 mg/dL — ABNORMAL LOW (ref 8–23)
CO2: 24 mmol/L (ref 22–32)
Calcium: 8.2 mg/dL — ABNORMAL LOW (ref 8.9–10.3)
Chloride: 100 mmol/L (ref 98–111)
Creatinine, Ser: 0.63 mg/dL (ref 0.61–1.24)
GFR, Estimated: >60 mL/min (ref >60)
Glucose, Bld: 85 mg/dL (ref 70–99)
Potassium: 3.9 mmol/L (ref 3.5–5.1)
Sodium: 133 mmol/L — ABNORMAL LOW (ref 135–145)
Total Bilirubin: 0.7 mg/dL (ref 0.3–1.2)
Total Protein: 5 g/dL — ABNORMAL LOW (ref 6.5–8.1)

## 2023-08-24 LAB — HEPARIN LEVEL (UNFRACTIONATED)
Heparin Unfractionated: 0.16 [IU]/mL — ABNORMAL LOW (ref 0.30–0.70)
Heparin Unfractionated: 0.33 [IU]/mL (ref 0.30–0.70)

## 2023-08-24 LAB — HEMOGLOBIN AND HEMATOCRIT, BLOOD
HCT: 32.7 % — ABNORMAL LOW (ref 39.0–52.0)
HCT: 34.3 % — ABNORMAL LOW (ref 39.0–52.0)
Hemoglobin: 11 g/dL — ABNORMAL LOW (ref 13.0–17.0)
Hemoglobin: 11.3 g/dL — ABNORMAL LOW (ref 13.0–17.0)

## 2023-08-24 LAB — PROTIME-INR
INR: 1.2 (ref 0.8–1.2)
Prothrombin Time: 15.5 s — ABNORMAL HIGH (ref 11.4–15.2)

## 2023-08-24 LAB — TROPONIN I (HIGH SENSITIVITY): Troponin I (High Sensitivity): 9 ng/L (ref <18)

## 2023-08-24 LAB — C DIFFICILE QUICK SCREEN W PCR REFLEX
C Diff antigen: NEGATIVE
C Diff interpretation: NOT DETECTED
C Diff toxin: NEGATIVE

## 2023-08-24 LAB — APTT: aPTT: 30 s (ref 24–36)

## 2023-08-24 LAB — LACTIC ACID, PLASMA: Lactic Acid, Venous: 1.1 mmol/L (ref 0.5–1.9)

## 2023-08-24 MED ORDER — SODIUM CHLORIDE 0.9 % IV SOLN: INTRAVENOUS | Status: DC

## 2023-08-24 MED ORDER — HYDROMORPHONE HCL 1 MG/ML IJ SOLN: 0.5000 mg | INTRAMUSCULAR | Status: DC | PRN

## 2023-08-24 MED ORDER — MELATONIN 5 MG PO TABS
2.5000 mg | ORAL_TABLET | Freq: Every day | ORAL | Status: DC
  Administered 2023-08-24: 2.5 mg via ORAL
  Filled 2023-08-24 (×2): qty 1

## 2023-08-24 MED ORDER — ONDANSETRON HCL 4 MG/2ML IJ SOLN: 4.0000 mg | Freq: Four times a day (QID) | INTRAMUSCULAR | Status: DC | PRN

## 2023-08-24 MED ORDER — ONDANSETRON HCL 4 MG/2ML IJ SOLN: 4.0000 mg | Freq: Three times a day (TID) | INTRAMUSCULAR | Status: DC | PRN

## 2023-08-24 MED ORDER — LEVOFLOXACIN IN D5W 750 MG/150ML IV SOLN
750.0000 mg | INTRAVENOUS | Status: DC
  Administered 2023-08-24 – 2023-08-27 (×4): 750 mg via INTRAVENOUS
  Filled 2023-08-24 (×4): qty 150

## 2023-08-24 MED ORDER — IOHEXOL 350 MG/ML SOLN
75.0000 mL | Freq: Once | INTRAVENOUS | Status: AC | PRN
  Administered 2023-08-24: 75 mL via INTRAVENOUS

## 2023-08-24 MED ORDER — HEPARIN BOLUS VIA INFUSION
4400.0000 [IU] | Freq: Once | INTRAVENOUS | Status: AC
  Administered 2023-08-24: 4400 [IU] via INTRAVENOUS
  Filled 2023-08-24: qty 4400

## 2023-08-24 MED ORDER — ONDANSETRON HCL 4 MG PO TABS: 4.0000 mg | ORAL_TABLET | Freq: Four times a day (QID) | ORAL | Status: DC | PRN

## 2023-08-24 MED ORDER — HEPARIN (PORCINE) 25000 UT/250ML-% IV SOLN
1750.0000 [IU]/h | INTRAVENOUS | Status: DC
  Administered 2023-08-24: 1600 [IU]/h via INTRAVENOUS
  Administered 2023-08-24: 1300 [IU]/h via INTRAVENOUS
  Administered 2023-08-25 – 2023-08-27 (×4): 1750 [IU]/h via INTRAVENOUS
  Filled 2023-08-24 (×7): qty 250

## 2023-08-24 MED ORDER — HEPARIN BOLUS VIA INFUSION
2300.0000 [IU] | Freq: Once | INTRAVENOUS | Status: AC
  Administered 2023-08-24: 2300 [IU] via INTRAVENOUS
  Filled 2023-08-24: qty 2300

## 2023-08-24 NOTE — Progress Notes (Signed)
Pharmacy Antibiotic Note  [name] is a 62 y.o. male w/ PHM of CVA, UC, HTN, HLDadmitted on 08/24/2023 with PNA. Pharmacy has been consulted for levofloxacin dosing. He has a noted penicillin (anaphylaxis) and cephalosporin (unknown reaction) allergy  Plan: start levofloxacin 750 mg IV every 24 hours ---follow renal function for needed dose adjustments   Height: 5\' 10"  (177.8 cm) Weight: 72 kg (158 lb 11.2 oz) IBW/kg (Calculated) : 73  Temp (24hrs), Avg:98.3 F (36.8 C), Min:98.3 F (36.8 C), Max:98.3 F (36.8 C)  Recent Labs  Lab 08/24/23 0204 08/24/23 0336  WBC 5.9  --   CREATININE 0.63  --   LATICACIDVEN  --  1.1    Estimated Creatinine Clearance: 98.8 mL/min (by C-G formula based on SCr of 0.63 mg/dL).    Allergies  Allergen Reactions   Penicillins Anaphylaxis    Has patient had a PCN reaction causing immediate rash, facial/tongue/throat swelling, SOB or lightheadedness with hypotension: Yes Has patient had a PCN reaction causing severe rash involving mucus membranes or skin necrosis: No Has patient had a PCN reaction that required hospitalization No Has patient had a PCN reaction occurring within the last 10 years: No If all of the above answers are "NO", then may proceed with Cephalosporin use.   Cephalosporins Other (See Comments)    Reaction:  Unknown     Antimicrobials this admission: 09/15 levofloxacin >>   Microbiology results: 09/15 C diff (-)  Thank you for allowing pharmacy to be a part of this patient's care.  Lowella Bandy 08/24/2023 7:48 AM

## 2023-08-24 NOTE — H&P (Signed)
History and Physical    Patient: [name]:[id number] DOB: 25-Jan-1961 DOA: 08/24/2023 DOS: the patient was seen and examined on 08/24/2023 PCP: System, Provider Not In  Patient coming from: Home  Chief Complaint:  Chief Complaint  Patient presents with   DVT   HPI: [name] is a 62 y.o. male with medical history significant of ulcerative colitis, CVA, hypertension, hyperlipidemia presented with internal jugular DVT.  Patient noted to have been recently admitted at the end of August for ulcerative colitis associated GI bleeding as well as sepsis secondary to UTI.  Patient reports roughly 1 to 2 weeks of worsening lower extremity swelling and generalized malaise.  No chest pain or shortness of breath.  No nausea or vomiting.  No hemiparesis or confusion.  States swelling and discomfort has been progressively worsening.  Noted prior alcohol use.  Patient denies abstinence for several months.  No reported tobacco use Presented to the ER afebrile, hemodynamically stable.  Satting well on room air.  White count 5.9, hemoglobin 9.5, platelets 443.  Lactate 1.1.  Troponin 9.  Creatinine 0.63.  CTA with acute pulmonary embolism within the right lower lobe segment as well as multifocal lower bibasilar tree-in-bud opacities concerning for aspiration versus infection.  Ultrasound with noted thrombus noted in the left internal jugular vein.  Positive occlusion. Review of Systems: As mentioned in the history of present illness. All other systems reviewed and are negative. Past Medical History:  Diagnosis Date   Dysphagia    Hyperlipidemia    Hypertension    Stroke (HCC)    Ulcerative colitis [facility])    Past Surgical History:  Procedure Laterality Date   COLONOSCOPY WITH PROPOFOL N/A 02/08/2016   Procedure: COLONOSCOPY WITH PROPOFOL;  Surgeon: Wallace Cullens, MD;  Location: [facility] ENDOSCOPY;  Service: Gastroenterology;  Laterality: N/A;   ESOPHAGOGASTRODUODENOSCOPY (EGD) WITH PROPOFOL N/A 02/08/2016    Procedure: ESOPHAGOGASTRODUODENOSCOPY (EGD) WITH PROPOFOL;  Surgeon: Wallace Cullens, MD;  Location: [facility] ENDOSCOPY;  Service: Gastroenterology;  Laterality: N/A;   NO PAST SURGERIES     Social History:  reports that he has never smoked. He has never used smokeless tobacco. He reports current alcohol use. He reports current drug use. Drug: Marijuana.  Allergies  Allergen Reactions   Penicillins Anaphylaxis    Has patient had a PCN reaction causing immediate rash, facial/tongue/throat swelling, SOB or lightheadedness with hypotension: Yes Has patient had a PCN reaction causing severe rash involving mucus membranes or skin necrosis: No Has patient had a PCN reaction that required hospitalization No Has patient had a PCN reaction occurring within the last 10 years: No If all of the above answers are "NO", then may proceed with Cephalosporin use.   Cephalosporins Other (See Comments)    Reaction:  Unknown     Family History  Problem Relation Age of Onset   Breast cancer Mother    Diabetes Father    Hypertension Father    Breast cancer Sister     Prior to Admission medications   Medication Sig Start Date End Date Taking? Authorizing Provider  Cholecalciferol (VITAMIN D3) 5000 UNITS TABS Take 5,000 Units by mouth daily.   Yes [provider]  traZODone (DESYREL) 100 MG tablet Take 100 mg by mouth at bedtime as needed for sleep.   Yes [provider]  balsalazide (COLAZAL) 750 MG capsule Take 750 mg by mouth 3 (three) times daily. Patient not taking: Reported on 08/24/2023    [provider]  clopidogrel (PLAVIX)  75 MG tablet Take 75 mg by mouth daily. Patient not taking: Reported on 08/24/2023    [provider]    Physical Exam: Vitals:   08/24/23 0152 08/24/23 0215 08/24/23 0222  BP: 103/79    Pulse: 82    Resp: 20    Temp: 98.3 F (36.8 C)    TempSrc: Oral    SpO2: 97%    Weight:  74.4 kg 72 kg  Height:  5\' 10"  (1.778 m)   Physical  Exam Constitutional:      Appearance: He is normal weight.  HENT:     Head: Normocephalic and atraumatic.     Nose: Nose normal.     Mouth/Throat:     Mouth: Mucous membranes are moist.  Cardiovascular:     Rate and Rhythm: Normal rate and regular rhythm.  Pulmonary:     Effort: Pulmonary effort is normal.  Abdominal:     General: Bowel sounds are normal.  Musculoskeletal:     Cervical back: Normal range of motion.  Skin:    General: Skin is warm.  Neurological:     General: No focal deficit present.  Psychiatric:        Mood and Affect: Mood normal.     Data Reviewed:  There are no new results to review at this time. US Venous Img Upper Uni Left CLINICAL DATA:  Left arm pain and swelling  EXAM: LEFT UPPER EXTREMITY VENOUS DOPPLER ULTRASOUND  TECHNIQUE: Gray-scale sonography with graded compression, as well as color Doppler and duplex ultrasound were performed to evaluate the upper extremity deep venous system from the level of the subclavian vein and including the jugular, axillary, basilic, radial, ulnar and upper cephalic vein. Spectral Doppler was utilized to evaluate flow at rest and with distal augmentation maneuvers.  COMPARISON:  None Available.  FINDINGS: Contralateral Subclavian Vein: Respiratory phasicity is normal and symmetric with the symptomatic side. No evidence of thrombus. Normal compressibility.  Internal Jugular Vein: Thrombus is noted with decreased compressibility.  Subclavian Vein: No evidence of thrombus. Normal compressibility, respiratory phasicity and response to augmentation.  Axillary Vein: No evidence of thrombus. Normal compressibility, respiratory phasicity and response to augmentation.  Cephalic Vein: No evidence of thrombus. Normal compressibility, respiratory phasicity and response to augmentation.  Basilic Vein: No evidence of thrombus. Normal compressibility, respiratory phasicity and response to  augmentation.  Brachial Veins: No evidence of thrombus. Normal compressibility, respiratory phasicity and response to augmentation.  Radial Veins: No evidence of thrombus. Normal compressibility, respiratory phasicity and response to augmentation.  Ulnar Veins: No evidence of thrombus. Normal compressibility, respiratory phasicity and response to augmentation.  Venous Reflux:  None visualized.  Other Findings:  None visualized.  IMPRESSION: Thrombus is noted in the left internal jugular vein. This is occlusive in nature. No other thrombus is seen.  Electronically Signed   By: Alcide Clever M.D.   On: 08/24/2023 03:48 CT Angio Chest PE W and/or Wo Contrast CLINICAL DATA:  Pulmonary embolism.  EXAM: CT ANGIOGRAPHY CHEST WITH CONTRAST  TECHNIQUE: Multidetector CT imaging of the chest was performed using the standard protocol during bolus administration of intravenous contrast. Multiplanar CT image reconstructions and MIPs were obtained to evaluate the vascular anatomy.  RADIATION DOSE REDUCTION: This exam was performed according to the departmental dose-optimization program which includes automated exposure control, adjustment of the mA and/or kV according to patient size and/or use of iterative reconstruction technique.  CONTRAST:  75mL OMNIPAQUE IOHEXOL 350 MG/ML SOLN  COMPARISON:  Chest CT  08/04/2023  FINDINGS: Cardiovascular: There is a filling defect within the right lower lobe segmental arterial branch. No other pulmonary embolism. No evidence of right heart strain.  Mediastinum/Nodes: No enlarged mediastinal, hilar, or axillary lymph nodes. Thyroid gland, trachea, and esophagus demonstrate no significant findings.  Lungs/Pleura: Multifocal bibasilar nodular/tree-in-bud type opacities, new since 08/04/2023. No pleural effusion or pneumothorax. No pulmonary infarction. Central airways are patent.  Upper Abdomen: No acute abnormality.  Musculoskeletal: No  chest wall abnormality. No acute or significant osseous findings.  Review of the MIP images confirms the above findings.  IMPRESSION: 1. Acute pulmonary embolism within a right lower lobe segmental arterial branch. No evidence of right heart strain. 2. Multifocal bibasilar nodular/tree-in-bud type opacities, new since 08/04/2023, concerning for aspiration/infection  Critical Value/emergent results were called by telephone at the time of interpretation on 08/24/2023 at 3:42 am to provider Shaune Pollack , who verbally acknowledged these results.  Electronically Signed   By: Deatra Robinson M.D.   On: 08/24/2023 03:43  . Lab Results  Component Value Date   WBC 5.9 08/24/2023   HGB 11.5 (L) 08/24/2023   HCT 35.2 (L) 08/24/2023   MCV 88.0 08/24/2023   PLT 443 (H) 08/24/2023   Last metabolic panel Lab Results  Component Value Date   GLUCOSE 85 08/24/2023   NA 133 (L) 08/24/2023   K 3.9 08/24/2023   CL 100 08/24/2023   CO2 24 08/24/2023   BUN 5 (L) 08/24/2023   CREATININE 0.63 08/24/2023   GFRNONAA >60 08/24/2023   CALCIUM 8.2 (L) 08/24/2023   PHOS 3.1 08/09/2023   PROT 5.0 (L) 08/24/2023   ALBUMIN 2.0 (L) 08/24/2023   BILITOT 0.7 08/24/2023   ALKPHOS 61 08/24/2023   AST 15 08/24/2023   ALT 17 08/24/2023   ANIONGAP 9 08/24/2023    Assessment and Plan: * DVT (deep venous thrombosis) (HCC) + occlusive internal jugular DVT on imaging  Per Dr. Jerrye Beavers, case discussed w/ vascular surgery w/ no tentative interventions at present  Started on heparin gtt  Hgb stable 11.5 in setting of recent of admission for UC assd GI bleeding  Monitor hgb and GI outpt  Follow up formal vascular surgery recommendations     Stroke [facility]) Hx/o CVA  On plavix  Now on heparin in setting of occlusive internal jugular DVT  Hold for now given recent history of UC assd GI bleeding  Follow    HTN (hypertension) BP stable  Titrate home regimen    HLD (hyperlipidemia) Stable  Cont statin     Alcohol use Prior heavy alcohol use  Pt states etoh abstinence of several months Monitor for now  Follow   Ulcerative colitis, chronic (HCC) Noted admission 8/26--->8/31 for UC assd GI bleeding  Pt denies any active GI bleeding  Hgb stable at 11.5  Cont balsalazide         Advance Care Planning:   Code Status: Full Code   Consults: Will formally consult vascular surgery   Family Communication: No family at the bedside   Severity of Illness: The appropriate patient status for this patient is INPATIENT. Inpatient status is judged to be reasonable and necessary in order to provide the required intensity of service to ensure the patient's safety. The patient's presenting symptoms, physical exam findings, and initial radiographic and laboratory data in the context of their chronic comorbidities is felt to place them at high risk for further clinical deterioration. Furthermore, it is not anticipated that the patient will be medically stable for  discharge from the hospital within 2 midnights of admission.   * I certify that at the point of admission it is my clinical judgment that the patient will require inpatient hospital care spanning beyond 2 midnights from the point of admission due to high intensity of service, high risk for further deterioration and high frequency of surveillance required.*  Author: Floydene Flock, MD 08/24/2023 8:00 AM  For on call review [email or web address].

## 2023-08-24 NOTE — Assessment & Plan Note (Signed)
+   occlusive internal jugular DVT on imaging  Per Dr. Jerrye Beavers, case discussed w/ vascular surgery w/ no tentative interventions at present  Started on heparin gtt  Hgb stable 11.5 in setting of recent of admission for UC assd GI bleeding  Monitor hgb and GI outpt  Follow up formal vascular surgery recommendations

## 2023-08-24 NOTE — Assessment & Plan Note (Signed)
CTA and ultrasound with noted internal jugular DVT also with imaging concerning for new multi lobar pneumonia No hypoxia or significant white count at present Will place on IV Levaquin for infectious coverage given recent admission for sepsis as well as UTI Panculture De-escalate antibiotics as appropriate Monitor

## 2023-08-24 NOTE — ED Notes (Signed)
Patient refused supper

## 2023-08-24 NOTE — Assessment & Plan Note (Signed)
Hx/o CVA  On plavix  Now on heparin in setting of occlusive internal jugular DVT  Hold for now given recent history of UC assd GI bleeding  Follow

## 2023-08-24 NOTE — Progress Notes (Signed)
ANTICOAGULATION CONSULT NOTE  Pharmacy Consult for Heparin  Indication:  internal jugular DVT   Allergies  Allergen Reactions   Penicillins Anaphylaxis    Has patient had a PCN reaction causing immediate rash, facial/tongue/throat swelling, SOB or lightheadedness with hypotension: Yes Has patient had a PCN reaction causing severe rash involving mucus membranes or skin necrosis: No Has patient had a PCN reaction that required hospitalization No Has patient had a PCN reaction occurring within the last 10 years: No If all of the above answers are "NO", then may proceed with Cephalosporin use.   Cephalosporins Other (See Comments)    Reaction:  Unknown     Patient Measurements: Height: 5\' 10"  (177.8 cm) Weight: 72 kg (158 lb 11.2 oz) IBW/kg (Calculated) : 73 Heparin Dosing Weight: 74.4 kg   Vital Signs: Temp: 98.3 F (36.8 C) (09/15 1759) Temp Source: Oral (09/15 1759) BP: 103/83 (09/15 1530) Pulse Rate: 101 (09/15 1530)  Labs: Recent Labs    08/24/23 0204 08/24/23 0336 08/24/23 0910 08/24/23 1831  HGB 11.5*  --  11.0* 11.3*  HCT 35.2*  --  32.7* 34.3*  PLT 443*  --   --   --   APTT 30  --   --   --   LABPROT 15.5*  --   --   --   INR 1.2  --   --   --   HEPARINUNFRC  --   --  0.16* 0.33  CREATININE 0.63  --   --   --   TROPONINIHS  --  9  --   --     Estimated Creatinine Clearance: 98.8 mL/min (by C-G formula based on SCr of 0.63 mg/dL).   Medical History: Past Medical History:  Diagnosis Date   Dysphagia    Hyperlipidemia    Hypertension    Stroke (HCC)    Ulcerative colitis [facility])     Assessment: Pharmacy consulted to dose heparin in this 62 year old male admitted with internal jugular DVT.  No prior anticoag noted. Baseline labs: INR 1.2, aPTT 30s  0915 1831  HL 0.33  therapeutic x1  Goal of Therapy:  Heparin level 0.3-0.7 units/ml Monitor platelets by anticoagulation protocol: Yes  Heparin level is therapeutic at 0.33 on 1600 units/hour. No issues  with infusion reported, no signs/symptoms of bleeding noted.   Plan: Continue heparin infusion at 1600 units/hr Check confirmatory Anti-Xa level in 6 hours Monitor Anti-Xa levels daily while on heparin infusion Monitor CBC and signs/symptoms of bleeding   Thank you for involving pharmacy in this patient's care.   Rockwell Alexandria, PharmD Clinical Pharmacist 08/24/2023 7:08 PM

## 2023-08-24 NOTE — Consult Note (Signed)
[facility] [place]  ASSESSMENT / PLAN: 62 y.o. male with left internal jugular deep venous thrombosis and pulmonary embolism. No indications for surgical intervention. Recommend anticoagulation for a minimum of 6 months. Inflammation from ulcerative colitis may be the driver of his thrombosis. Please call for questions.  CHIEF COMPLAINT: left arm swelling and pain  HISTORY OF PRESENT ILLNESS: [name] is a 62 y.o. male who presents to the hospital for evaluation of left arm swelling and pain. On my evaluation, he reports both arms are swollen and the left arm is worse. His only request is a sleeping pill. He reports the swelling has been present for several days. He reports he has seen a doctor in the past for ulcerative collitis, but it has been "a long time." He reports some abdominal pain.   Past Medical History:  Diagnosis Date   Dysphagia    Hyperlipidemia    Hypertension    Stroke (HCC)    Ulcerative colitis [facility])     Past Surgical History:  Procedure Laterality Date   COLONOSCOPY WITH PROPOFOL N/A 02/08/2016   Procedure: COLONOSCOPY WITH PROPOFOL;  Surgeon: Wallace Cullens, MD;  Location: [facility] ENDOSCOPY;  Service: Gastroenterology;  Laterality: N/A;   ESOPHAGOGASTRODUODENOSCOPY (EGD) WITH PROPOFOL N/A 02/08/2016   Procedure: ESOPHAGOGASTRODUODENOSCOPY (EGD) WITH PROPOFOL;  Surgeon: Wallace Cullens, MD;  Location: [facility] ENDOSCOPY;  Service: Gastroenterology;  Laterality: N/A;   NO PAST SURGERIES      Family History  Problem Relation Age of Onset   Breast cancer Mother    Diabetes Father    Hypertension Father    Breast cancer Sister     Social History   Socioeconomic History   Marital status: Single    Spouse name: Not on file   Number of children: Not on file   Years of education: Not on file   Highest education level: Not on file  Occupational History   Not on file  Tobacco Use   Smoking status: Never   Smokeless tobacco: Never  Substance and  Sexual Activity   Alcohol use: Yes   Drug use: Yes    Types: Marijuana   Sexual activity: Not on file  Other Topics Concern   Not on file  Social History Narrative   Not on file   Social Determinants of Health   Financial Resource Strain: Not on file  Food Insecurity: No Food Insecurity (08/04/2023)   Hunger Vital Sign    Worried About Running Out of Food in the Last Year: Never true    Ran Out of Food in the Last Year: Never true  Transportation Needs: Unmet Transportation Needs (08/04/2023)   PRAPARE - Administrator, Civil Service (Medical): Yes    Lack of Transportation (Non-Medical): Yes  Physical Activity: Not on file  Stress: Not on file  Social Connections: Not on file  Intimate Partner Violence: Not At Risk (08/04/2023)   Humiliation, Afraid, Rape, and Kick questionnaire    Fear of Current or Ex-Partner: No    Emotionally Abused: No    Physically Abused: No    Sexually Abused: No    Allergies  Allergen Reactions   Penicillins Anaphylaxis    Has patient had a PCN reaction causing immediate rash, facial/tongue/throat swelling, SOB or lightheadedness with hypotension: Yes Has patient had a PCN reaction causing severe rash involving mucus membranes or skin necrosis: No Has patient had a PCN reaction that required hospitalization No Has patient had a PCN  reaction occurring within the last 10 years: No If all of the above answers are "NO", then may proceed with Cephalosporin use.   Cephalosporins Other (See Comments)    Reaction:  Unknown     Current Facility-Administered Medications  Medication Dose Route Frequency Provider Last Rate Last Admin   0.9 %  sodium chloride infusion   Intravenous Continuous Floydene Flock, MD       heparin ADULT infusion 100 units/mL (25000 units/240mL)  1,600 Units/hr Intravenous Continuous Lowella Bandy, RPH 16 mL/hr at 08/24/23 1632 1,600 Units/hr at 08/24/23 1632   HYDROmorphone (DILAUDID) injection 0.5 mg  0.5 mg  Intravenous Q4H PRN Andris Baumann, MD       levofloxacin (LEVAQUIN) IVPB 750 mg  750 mg Intravenous Q24H Floydene Flock, MD   Stopped at 08/24/23 1037   ondansetron (ZOFRAN) tablet 4 mg  4 mg Oral Q6H PRN Floydene Flock, MD       Or   ondansetron [facility]) injection 4 mg  4 mg Intravenous Q6H PRN Floydene Flock, MD       Current Outpatient Medications  Medication Sig Dispense Refill   Cholecalciferol (VITAMIN D3) 5000 UNITS TABS Take 5,000 Units by mouth daily.     traZODone (DESYREL) 100 MG tablet Take 100 mg by mouth at bedtime as needed for sleep.     balsalazide (COLAZAL) 750 MG capsule Take 750 mg by mouth 3 (three) times daily. (Patient not taking: Reported on 08/24/2023)     clopidogrel (PLAVIX) 75 MG tablet Take 75 mg by mouth daily. (Patient not taking: Reported on 08/24/2023)      PHYSICAL EXAM Vitals:   08/24/23 1314 08/24/23 1515 08/24/23 1530 08/24/23 1759  BP:  100/78 103/83   Pulse:  99 (!) 101   Resp:  (!) 21 (!) 25   Temp: 98.3 F (36.8 C)   98.3 F (36.8 C)  TempSrc: Oral   Oral  SpO2:  96% 96%   Weight:      Height:       Chronically unwell man in no distress Regular rate and rhythm Unlabored breathing 2+ radial pulses Swelling bilateral upper extremities Tender RLQ. Abdomen not distended.  PERTINENT LABORATORY AND RADIOLOGIC DATA  Most recent CBC    Latest Ref Rng & Units 08/24/2023    6:31 PM 08/24/2023    9:10 AM 08/24/2023    2:04 AM  CBC  WBC 4.0 - 10.5 K/uL   5.9   Hemoglobin 13.0 - 17.0 g/dL 16.1  09.6  04.5   Hematocrit 39.0 - 52.0 % 34.3  32.7  35.2   Platelets 150 - 400 K/uL   443      Most recent CMP    Latest Ref Rng & Units 08/24/2023    2:04 AM 08/09/2023   12:03 PM 08/09/2023    5:10 AM  CMP  Glucose 70 - 99 mg/dL 85   90   BUN 8 - 23 mg/dL 5   14   Creatinine 4.09 - 1.24 mg/dL 8.11   9.14   Sodium 782 - 145 mmol/L 133  129  128   Potassium 3.5 - 5.1 mmol/L 3.9   3.9   Chloride 98 - 111 mmol/L 100   98   CO2 22 - 32  mmol/L 24   26   Calcium 8.9 - 10.3 mg/dL 8.2   7.4   Total Protein 6.5 - 8.1 g/dL 5.0     Total Bilirubin 0.3 -  1.2 mg/dL 0.7     Alkaline Phos 38 - 126 U/L 61     AST 15 - 41 U/L 15     ALT 0 - 44 U/L 17       Renal function Estimated Creatinine Clearance: 98.8 mL/min (by C-G formula based on SCr of 0.63 mg/dL).  Hemoglobin A1C (%)  Date Value  08/21/2014 < 3.5 (L)   Hgb A1c MFr Bld (%)  Date Value  08/04/2023 4.9    Ldl Cholesterol, Calc  Date Value Ref Range Status  08/21/2014 24 0 - 100 mg/dL Final   LDL Cholesterol  Date Value Ref Range Status  08/04/2023 28 0 - 99 mg/dL Final    Comment:           Total Cholesterol/HDL:CHD Risk Coronary Heart Disease Risk Table                     Men   Women  1/2 Average Risk   3.4   3.3  Average Risk       5.0   4.4  2 X Average Risk   9.6   7.1  3 X Average Risk  23.4   11.0        Use the calculated Patient Ratio above and the CHD Risk Table to determine the patient's CHD Risk.        ATP III CLASSIFICATION (LDL):  <100     mg/dL   Optimal  161-096  mg/dL   Near or Above                    Optimal  130-159  mg/dL   Borderline  045-409  mg/dL   High  >811     mg/dL   Very High Performed at [facility], [address] [place], [place] [address]     CLINICAL DATA:  Left arm pain and swelling   EXAM: LEFT UPPER EXTREMITY VENOUS DOPPLER ULTRASOUND   TECHNIQUE: Gray-scale sonography with graded compression, as well as color Doppler and duplex ultrasound were performed to evaluate the upper extremity deep venous system from the level of the subclavian vein and including the jugular, axillary, basilic, radial, ulnar and upper cephalic vein. Spectral Doppler was utilized to evaluate flow at rest and with distal augmentation maneuvers.   COMPARISON:  None Available.   FINDINGS: Contralateral Subclavian Vein: Respiratory phasicity is normal and symmetric with the symptomatic side. No evidence of  thrombus. Normal compressibility.   Internal Jugular Vein: Thrombus is noted with decreased compressibility.   Subclavian Vein: No evidence of thrombus. Normal compressibility, respiratory phasicity and response to augmentation.   Axillary Vein: No evidence of thrombus. Normal compressibility, respiratory phasicity and response to augmentation.   Cephalic Vein: No evidence of thrombus. Normal compressibility, respiratory phasicity and response to augmentation.   Basilic Vein: No evidence of thrombus. Normal compressibility, respiratory phasicity and response to augmentation.   Brachial Veins: No evidence of thrombus. Normal compressibility, respiratory phasicity and response to augmentation.   Radial Veins: No evidence of thrombus. Normal compressibility, respiratory phasicity and response to augmentation.   Ulnar Veins: No evidence of thrombus. Normal compressibility, respiratory phasicity and response to augmentation.   Venous Reflux:  None visualized.   Other Findings:  None visualized.   IMPRESSION: Thrombus is noted in the left internal jugular vein. This is occlusive in nature. No other thrombus is seen.  CLINICAL DATA:  Pulmonary embolism.   EXAM: CT ANGIOGRAPHY CHEST  WITH CONTRAST   TECHNIQUE: Multidetector CT imaging of the chest was performed using the standard protocol during bolus administration of intravenous contrast. Multiplanar CT image reconstructions and MIPs were obtained to evaluate the vascular anatomy.   RADIATION DOSE REDUCTION: This exam was performed according to the departmental dose-optimization program which includes automated exposure control, adjustment of the mA and/or kV according to patient size and/or use of iterative reconstruction technique.   CONTRAST:  75mL OMNIPAQUE IOHEXOL 350 MG/ML SOLN   COMPARISON:  Chest CT 08/04/2023   FINDINGS: Cardiovascular: There is a filling defect within the right lower lobe segmental arterial  branch. No other pulmonary embolism. No evidence of right heart strain.   Mediastinum/Nodes: No enlarged mediastinal, hilar, or axillary lymph nodes. Thyroid gland, trachea, and esophagus demonstrate no significant findings.   Lungs/Pleura: Multifocal bibasilar nodular/tree-in-bud type opacities, new since 08/04/2023. No pleural effusion or pneumothorax. No pulmonary infarction. Central airways are patent.   Upper Abdomen: No acute abnormality.   Musculoskeletal: No chest wall abnormality. No acute or significant osseous findings.   Review of the MIP images confirms the above findings.   IMPRESSION: 1. Acute pulmonary embolism within a right lower lobe segmental arterial branch. No evidence of right heart strain. 2. Multifocal bibasilar nodular/tree-in-bud type opacities, new since 08/04/2023, concerning for aspiration/infection   Critical Value/emergent results were called by telephone at the time of interpretation on 08/24/2023 at 3:42 am to provider Shaune Pollack , who verbally acknowledged these results.  Rande Brunt. Lenell Antu, MD FACS [facility] Phone Number: [phone] 08/24/2023 8:00 PM   Total time spent on preparing this encounter including chart review, data review, collecting history, examining the patient, coordinating care for this new patient, 60 minutes.  Portions of this report may have been transcribed using voice recognition software.  Every effort has been made to ensure accuracy; however, inadvertent computerized transcription errors may still be present.

## 2023-08-24 NOTE — Assessment & Plan Note (Signed)
BP stable Titrate home regimen 

## 2023-08-24 NOTE — Assessment & Plan Note (Signed)
Stable  Cont statin

## 2023-08-24 NOTE — Progress Notes (Signed)
ANTICOAGULATION CONSULT NOTE  Pharmacy Consult for Heparin  Indication:  internal jugular DVT   Allergies  Allergen Reactions   Penicillins Anaphylaxis    Has patient had a PCN reaction causing immediate rash, facial/tongue/throat swelling, SOB or lightheadedness with hypotension: Yes Has patient had a PCN reaction causing severe rash involving mucus membranes or skin necrosis: No Has patient had a PCN reaction that required hospitalization No Has patient had a PCN reaction occurring within the last 10 years: No If all of the above answers are "NO", then may proceed with Cephalosporin use.   Cephalosporins Other (See Comments)    Reaction:  Unknown     Patient Measurements: Height: 5\' 10"  (177.8 cm) Weight: 72 kg (158 lb 11.2 oz) IBW/kg (Calculated) : 73 Heparin Dosing Weight: 74.4 kg   Vital Signs: Temp: 98.2 F (36.8 C) (09/15 0900) Temp Source: Oral (09/15 0900) BP: 108/79 (09/15 0900) Pulse Rate: 101 (09/15 0900)  Labs: Recent Labs    08/24/23 0204 08/24/23 0336 08/24/23 0910  HGB 11.5*  --  11.0*  HCT 35.2*  --  32.7*  PLT 443*  --   --   APTT 30  --   --   LABPROT 15.5*  --   --   INR 1.2  --   --   HEPARINUNFRC  --   --  0.16*  CREATININE 0.63  --   --   TROPONINIHS  --  9  --     Estimated Creatinine Clearance: 98.8 mL/min (by C-G formula based on SCr of 0.63 mg/dL).   Medical History: Past Medical History:  Diagnosis Date   Dysphagia    Hyperlipidemia    Hypertension    Stroke (HCC)    Ulcerative colitis [facility])     Assessment: Pharmacy consulted to dose heparin in this 62 year old male admitted with internal jugular DVT.  No prior anticoag noted. Baseline labs: INR 1.2, aPTT 30s  Goal of Therapy:  Heparin level 0.3-0.7 units/ml Monitor platelets by anticoagulation protocol: Yes   Plan: heparin level subtherapeutic ---Give heparin 2300 units bolus x 1 ---increase heparin infusion rate to 1600 units/hr ---Check anti-Xa level in 6 hours after  rate change and at least once daily while on heparin ---Continue to monitor H&H and platelets  Lowella Bandy 08/24/2023,9:59 AM

## 2023-08-24 NOTE — ED Notes (Signed)
Open wound to sacrum area. Provider made aware

## 2023-08-24 NOTE — Assessment & Plan Note (Signed)
Noted admission 8/26--->8/31 for UC assd GI bleeding  Pt denies any active GI bleeding  Hgb stable at 11.5  Cont balsalazide

## 2023-08-24 NOTE — ED Provider Notes (Signed)
[facility] Provider Note    Event Date/Time   First MD Initiated Contact with Patient 08/24/23 0158     (approximate)   History   DVT   HPI  [name] is a 62 y.o. male with history of prior stroke, ulcerative colitis, hypertension, hyperlipidemia, prior alcohol use disorder, here with swelling to his left upper arm.  Patient reportedly had an outpatient ultrasound today which showed a clot.  Patient major complaint with me is some mild, aching, left-sided arm pain but also that he is very hungry.  He denies any shortness of breath but has had some intermittent chest pain.  This is not constant.  Denies any syncope.  No known recent vascular interventions or lines.     Physical Exam   Triage Vital Signs: ED Triage Vitals  Encounter Vitals Group     BP 08/24/23 0152 103/79     Systolic BP Percentile --      Diastolic BP Percentile --      Pulse Rate 08/24/23 0152 82     Resp 08/24/23 0152 20     Temp 08/24/23 0152 98.3 F (36.8 C)     Temp Source 08/24/23 0152 Oral     SpO2 08/24/23 0152 97 %     Weight 08/24/23 0215 164 lb (74.4 kg)     Height 08/24/23 0215 5\' 10"  (1.778 m)     Head Circumference --      Peak Flow --      Pain Score 08/24/23 0152 1     Pain Loc --      Pain Education --      Exclude from Growth Chart --     Most recent vital signs: Vitals:   08/24/23 0152 08/24/23 0900  BP: 103/79 108/79  Pulse: 82 (!) 101  Resp: 20 (!) 24  Temp: 98.3 F (36.8 C) 98.2 F (36.8 C)  SpO2: 97% 93%     General: Awake, no distress.  CV:  Good peripheral perfusion.  Regular rate rhythm. Resp:  Normal work of breathing.  Lungs clear to auscultation bilaterally. Abd:  No distention.  No tenderness. Other:  Asymmetric edema of the left upper extremity.  2+ radial pulses bilaterally.  Compartments soft.   ED Results / Procedures / Treatments   Labs (all labs ordered are listed, but only abnormal results are displayed) Labs  Reviewed  CBC WITH DIFFERENTIAL/PLATELET - Abnormal; Notable for the following components:      Result Value   RBC 4.00 (*)    Hemoglobin 11.5 (*)    HCT 35.2 (*)    Platelets 443 (*)    Abs Immature Granulocytes 0.11 (*)    All other components within normal limits  COMPREHENSIVE METABOLIC PANEL - Abnormal; Notable for the following components:   Sodium 133 (*)    BUN 5 (*)    Calcium 8.2 (*)    Total Protein 5.0 (*)    Albumin 2.0 (*)    All other components within normal limits  PROTIME-INR - Abnormal; Notable for the following components:   Prothrombin Time 15.5 (*)    All other components within normal limits  C DIFFICILE QUICK SCREEN W PCR REFLEX    CULTURE, BLOOD (ROUTINE X 2)  CULTURE, BLOOD (ROUTINE X 2)  RESPIRATORY PANEL BY PCR  LACTIC ACID, PLASMA  APTT  HEPARIN LEVEL (UNFRACTIONATED)  HEMOGLOBIN AND HEMATOCRIT, BLOOD  HEMOGLOBIN AND HEMATOCRIT, BLOOD  HEMOGLOBIN AND HEMATOCRIT, BLOOD  LEGIONELLA PNEUMOPHILA  SEROGP 1 UR AG  STREP PNEUMONIAE URINARY ANTIGEN  TROPONIN I (HIGH SENSITIVITY)     EKG Normal sinus rhythm, ventricular at 89.  PR 156, QRS 100, QTc 494.  No acute ST elevations or depressions.  No EKG evidence of acute ischemia or infarct.  Borderline prolonged QT.   RADIOLOGY Upper extremity DVT left: Occlusive thrombus of the left IJ. CT angio chest: PE of the right lower lobe segmental branch, possible aspiration   I also independently reviewed and agree with radiologist interpretations.   PROCEDURES:  Critical Care performed: Yes, see critical care procedure note(s)  Procedures    MEDICATIONS ORDERED IN ED: Medications  heparin ADULT infusion 100 units/mL (25000 units/249mL) (1,300 Units/hr Intravenous New Bag/Given 08/24/23 0240)  HYDROmorphone (DILAUDID) injection 0.5 mg (has no administration in time range)  0.9 %  sodium chloride infusion (has no administration in time range)  ondansetron (ZOFRAN) tablet 4 mg (has no administration  in time range)    Or  ondansetron (ZOFRAN) injection 4 mg (has no administration in time range)  levofloxacin (LEVAQUIN) IVPB 750 mg (750 mg Intravenous New Bag/Given 08/24/23 0831)  heparin bolus via infusion 4,400 Units (4,400 Units Intravenous Bolus from Bag 08/24/23 0238)  iohexol (OMNIPAQUE) 350 MG/ML injection 75 mL (75 mLs Intravenous Contrast Given 08/24/23 0315)     IMPRESSION / MDM / ASSESSMENT AND PLAN / ED COURSE  I reviewed the triage vital signs and the nursing notes.                              Differential diagnosis includes, but is not limited to, DVT, cellulitis, arterial occlusion, PE, ACS  Patient's presentation is most consistent with acute presentation with potential threat to life or bodily function.  The patient is on the cardiac monitor to evaluate for evidence of arrhythmia and/or significant heart rate changes  62 yo M here with left arm swelling, reported DVT on ultrasound. [place] not available here, so this was repeated and CT Angio obtained for evaluation of intra-thoracic spread or PE. CT Angio shows small PE but no R heart strain.  LA normal. Trop negative. CBC, CMP unremarkable. Pt satting well on RA. Pulses are intact.   Discussed case with Vascular Surgery, OK to start on heparin and admit at [facility]. Admit to medicine.   FINAL CLINICAL IMPRESSION(S) / ED DIAGNOSES   Final diagnoses:  Acute deep vein thrombosis (DVT) of non-extremity vein  Single subsegmental pulmonary embolism without acute cor pulmonale (HCC)     Rx / DC Orders   ED Discharge Orders     None        Note:  This document was prepared using Dragon voice recognition software and may include unintentional dictation errors.   Shaune Pollack, MD 08/24/23 [phone]

## 2023-08-24 NOTE — Progress Notes (Signed)
ANTICOAGULATION CONSULT NOTE - Initial Consult  Pharmacy Consult for Heparin  Indication:  internal jugular DVT   Allergies  Allergen Reactions   Penicillins Anaphylaxis    Has patient had a PCN reaction causing immediate rash, facial/tongue/throat swelling, SOB or lightheadedness with hypotension: Yes Has patient had a PCN reaction causing severe rash involving mucus membranes or skin necrosis: No Has patient had a PCN reaction that required hospitalization No Has patient had a PCN reaction occurring within the last 10 years: No If all of the above answers are "NO", then may proceed with Cephalosporin use.   Cephalosporins Other (See Comments)    Reaction:  Unknown     Patient Measurements: Height: 5\' 10"  (177.8 cm) Weight: 72 kg (158 lb 11.2 oz) IBW/kg (Calculated) : 73 Heparin Dosing Weight: 74.4 kg   Vital Signs: Temp: 98.3 F (36.8 C) (09/15 0152) Temp Source: Oral (09/15 0152) BP: 103/79 (09/15 0152) Pulse Rate: 82 (09/15 0152)  Labs: Recent Labs    08/24/23 0204  HGB 11.5*  HCT 35.2*  PLT 443*  APTT 30  LABPROT 15.5*  INR 1.2  CREATININE 0.63    Estimated Creatinine Clearance: 98.8 mL/min (by C-G formula based on SCr of 0.63 mg/dL).   Medical History: Past Medical History:  Diagnosis Date   Dysphagia    Hyperlipidemia    Hypertension    Stroke (HCC)    Ulcerative colitis (HCC)     Medications:  (Not in a hospital admission)   Assessment: Pharmacy consulted to dose heparin in this 62 year old male admitted with internal jugular DVT.  No prior anticoag noted.   CrCl = 98.8 ml/min   Goal of Therapy:  Heparin level 0.3-0.7 units/ml Monitor platelets by anticoagulation protocol: Yes   Plan:  Give 4400 units bolus x 1 Start heparin infusion at 1300 units/hr Check anti-Xa level in 6 hours and daily while on heparin Continue to monitor H&H and platelets  Lavone Weisel D 08/24/2023,2:26 AM

## 2023-08-24 NOTE — Assessment & Plan Note (Signed)
Prior heavy alcohol use  Pt states etoh abstinence of several months Monitor for now  Follow

## 2023-08-24 NOTE — ED Triage Notes (Signed)
Patient BIB [facility] for DVT in left internal jugular vein.Patient had a doppler done 08/23/2023 due to pain and edema.

## 2023-08-24 NOTE — ED Notes (Signed)
Patient was cleaned up from a bowel movement. It was noted that there was an area of break down noted at the top of the patient's gluteal cleft. Mexaplon was placed.

## 2023-08-25 ENCOUNTER — Inpatient Hospital Stay: Payer: 59

## 2023-08-25 ENCOUNTER — Other Ambulatory Visit: Payer: Self-pay

## 2023-08-25 ENCOUNTER — Encounter: Payer: Self-pay | Admitting: Internal Medicine

## 2023-08-25 ENCOUNTER — Other Ambulatory Visit (HOSPITAL_COMMUNITY): Payer: Self-pay

## 2023-08-25 DIAGNOSIS — I2699 Other pulmonary embolism without acute cor pulmonale: Secondary | ICD-10-CM

## 2023-08-25 DIAGNOSIS — K5190 Ulcerative colitis, unspecified, without complications: Secondary | ICD-10-CM | POA: Diagnosis not present

## 2023-08-25 DIAGNOSIS — I82C12 Acute embolism and thrombosis of left internal jugular vein: Secondary | ICD-10-CM

## 2023-08-25 LAB — GASTROINTESTINAL PANEL BY PCR, STOOL (REPLACES STOOL CULTURE)

## 2023-08-25 LAB — LIPID PANEL
Cholesterol: 71 mg/dL (ref 0–200)
HDL: 35 mg/dL — ABNORMAL LOW (ref >40)
LDL Cholesterol: 30 mg/dL (ref 0–99)
Total CHOL/HDL Ratio: 2 ratio
Triglycerides: 31 mg/dL (ref <150)
VLDL: 6 mg/dL (ref 0–40)

## 2023-08-25 LAB — C DIFFICILE QUICK SCREEN W PCR REFLEX
C Diff antigen: NEGATIVE
C Diff interpretation: NOT DETECTED
C Diff toxin: NEGATIVE

## 2023-08-25 LAB — COMPREHENSIVE METABOLIC PANEL
ALT: 14 U/L (ref 0–44)
AST: 13 U/L — ABNORMAL LOW (ref 15–41)
Albumin: 1.8 g/dL — ABNORMAL LOW (ref 3.5–5.0)
Alkaline Phosphatase: 71 U/L (ref 38–126)
Anion gap: 10 (ref 5–15)
BUN: 7 mg/dL — ABNORMAL LOW (ref 8–23)
CO2: 21 mmol/L — ABNORMAL LOW (ref 22–32)
Calcium: 8 mg/dL — ABNORMAL LOW (ref 8.9–10.3)
Chloride: 97 mmol/L — ABNORMAL LOW (ref 98–111)
Creatinine, Ser: 0.58 mg/dL — ABNORMAL LOW (ref 0.61–1.24)
GFR, Estimated: >60 mL/min (ref >60)
Glucose, Bld: 142 mg/dL — ABNORMAL HIGH (ref 70–99)
Potassium: 3.8 mmol/L (ref 3.5–5.1)
Sodium: 128 mmol/L — ABNORMAL LOW (ref 135–145)
Total Bilirubin: 0.8 mg/dL (ref 0.3–1.2)
Total Protein: 5.1 g/dL — ABNORMAL LOW (ref 6.5–8.1)

## 2023-08-25 LAB — RESPIRATORY PANEL BY PCR

## 2023-08-25 LAB — STREP PNEUMONIAE URINARY ANTIGEN: Strep Pneumo Urinary Antigen: NEGATIVE

## 2023-08-25 LAB — HEMOGLOBIN AND HEMATOCRIT, BLOOD
HCT: 32.6 % — ABNORMAL LOW (ref 39.0–52.0)
Hemoglobin: 11.1 g/dL — ABNORMAL LOW (ref 13.0–17.0)

## 2023-08-25 LAB — HEPARIN LEVEL (UNFRACTIONATED)
Heparin Unfractionated: 0.29 [IU]/mL — ABNORMAL LOW (ref 0.30–0.70)
Heparin Unfractionated: 0.39 [IU]/mL (ref 0.30–0.70)
Heparin Unfractionated: 0.45 [IU]/mL (ref 0.30–0.70)

## 2023-08-25 LAB — MRSA NEXT GEN BY PCR, NASAL: MRSA by PCR Next Gen: NOT DETECTED

## 2023-08-25 MED ORDER — HEPARIN BOLUS VIA INFUSION
1100.0000 [IU] | Freq: Once | INTRAVENOUS | Status: AC
  Administered 2023-08-25: 1100 [IU] via INTRAVENOUS
  Filled 2023-08-25: qty 1100

## 2023-08-25 MED ORDER — METHYLPREDNISOLONE SODIUM SUCC 40 MG IJ SOLR
40.0000 mg | Freq: Two times a day (BID) | INTRAMUSCULAR | Status: DC
  Administered 2023-08-25 – 2023-08-28 (×7): 40 mg via INTRAVENOUS
  Filled 2023-08-25 (×7): qty 1

## 2023-08-25 MED ORDER — IOHEXOL 300 MG/ML  SOLN
100.0000 mL | Freq: Once | INTRAMUSCULAR | Status: AC | PRN
  Administered 2023-08-25: 100 mL via INTRAVENOUS

## 2023-08-25 NOTE — ED Notes (Signed)
Pt back from CT. Pt refused breakfast this morning.

## 2023-08-25 NOTE — ED Notes (Signed)
This RN attempted to return a call to [facility] to update facility on patient status. Facility did not answer. This RN left voicemail and told facility to return call at their earliest convenience.

## 2023-08-25 NOTE — Progress Notes (Addendum)
CROSS COVER NOTE  NAME: [name] MRN: [id number] DOB : [birth year]    Concern as stated by nurse / staff   Call from [facility] radiology regarding DVT study IMPRESSION: 1. Occlusive thrombus within the mid to distal right cephalic vein as above. No other deep venous thrombosis within the right upper extremity. 2. Stable thrombus within the left internal jugular vein, unchanged since ultrasound performed yesterday.  IMPRESSION: 1. Positive for deep venous thrombosis involving the left profundus femoral, popliteal, and peroneal veins. 2. Positive for deep venous thrombosis involving the right posterior tibial and peroneal veins. No evidence of right lower extremity DVT above the level of the knee.    Pertinent findings on chart review: Today's progress note reviewed with documentation as follows: # Acute Pulmonary embolism # DVT (deep venous thrombosis) left internal jugular + occlusive internal jugular DVT on imaging  Vascular surgery was consulted, recommended no intervention, continue anticoagulation. Continue heparin gtt, monitor PTT as per protocol, transition to DOAC when stable Hgb stable 11.5 in setting of recent of admission for UC assd GI bleeding  Monitor H&H Follow-up venous duplex bilateral lower extremity and right arm due to edema for  Assessment and  Interventions   Assessment:  DVTs as mentioned above by [facility] radiology  Plan: Continue heparin drip X X

## 2023-08-25 NOTE — Consult Note (Addendum)
Midge Minium, MD [facility]  [address], [address] [place], [place] [address] Phone: [phone] Fax : [phone]  Consultation  Referring Provider:     Dr. Lucianne Muss Primary Care Physician:  System, Provider Not In Primary Gastroenterologist: [facility]         Reason for Consultation:     Ulcerative colitis  Date of Admission:  08/24/2023 Date of Consultation:  08/25/2023         HPI:   [name] is a 62 y.o. male who was admitted with a acute pulmonary embolus within the right lower lobe segment arterial branch.  The patient had been seen by me back last month for diarrhea and a history of ulcerative colitis with alcohol abuse.  The patient has stopped drinking since then since he was discharged to rehab where he states that his diarrhea continued.  The patient's previous GI panel was negative as was his C. difficile toxin.  He reports that the diarrhea never got better since he was discharged.  The patient was started on mesalamine in the hospital during the last admission.  Now he was found to have a thrombus in the left anterior jugular vein.  It was reported to be occlusive in nature.  The patient was started on anticoagulation.  The patient had his C. difficile rechecked which was negative this admission.  He has a repeat GI panel pending.  The patient's last colonoscopy was in 2017 and the patient has had biopsies consistent with chronic ulcerative colitis. As stated in my last consult note back in August the patient has not followed with GI for his ulcerative colitis and has been slowly deteriorating at home.  The patient's hemoglobin on admission was 11.5 consistent with what it was 2 weeks ago when he was discharged.  The patient had a rectal tube placed due to his profuse diarrhea all night.  Past Medical History:  Diagnosis Date   Dysphagia    Hyperlipidemia    Hypertension    Stroke (HCC)    Ulcerative colitis [facility])     Past Surgical History:  Procedure  Laterality Date   COLONOSCOPY WITH PROPOFOL N/A 02/08/2016   Procedure: COLONOSCOPY WITH PROPOFOL;  Surgeon: Wallace Cullens, MD;  Location: [facility] ENDOSCOPY;  Service: Gastroenterology;  Laterality: N/A;   ESOPHAGOGASTRODUODENOSCOPY (EGD) WITH PROPOFOL N/A 02/08/2016   Procedure: ESOPHAGOGASTRODUODENOSCOPY (EGD) WITH PROPOFOL;  Surgeon: Wallace Cullens, MD;  Location: [facility] ENDOSCOPY;  Service: Gastroenterology;  Laterality: N/A;   NO PAST SURGERIES      Prior to Admission medications   Medication Sig Start Date End Date Taking? Authorizing Provider  Cholecalciferol (VITAMIN D3) 5000 UNITS TABS Take 5,000 Units by mouth daily.   Yes [provider]  traZODone (DESYREL) 100 MG tablet Take 100 mg by mouth at bedtime as needed for sleep.   Yes [provider]  balsalazide (COLAZAL) 750 MG capsule Take 750 mg by mouth 3 (three) times daily. Patient not taking: Reported on 08/24/2023    [provider]  clopidogrel (PLAVIX) 75 MG tablet Take 75 mg by mouth daily. Patient not taking: Reported on 08/24/2023    [provider]    Family History  Problem Relation Age of Onset   Breast cancer Mother    Diabetes Father    Hypertension Father    Breast cancer Sister      Social History   Tobacco Use   Smoking status: Never   Smokeless tobacco: Never  Substance Use Topics   Alcohol use: Yes   Drug use: Yes    Types: Marijuana    Allergies as of 08/24/2023 - Review Complete 08/24/2023  Allergen Reaction Noted   Penicillins Anaphylaxis 09/13/2015   Cephalosporins Other (See Comments) 09/13/2015    Review of Systems:    All systems reviewed and negative except where noted in HPI.   Physical Exam:  Vital signs in last 24 hours: Temp:  [98.3 F (36.8 C)-99 F (37.2 C)] 99 F (37.2 C) (09/16 1150) Pulse Rate:  [91-103] 102 (09/16 1200) Resp:  [16-25] 21 (09/16 1200) BP: (98-112)/(63-87) 103/63 (09/16 1200) SpO2:  [92 %-99 %] 99 % (09/16 1200)   General:    Pleasant, cooperative in NAD Head:  Normocephalic and atraumatic. Eyes:   No icterus.   Conjunctiva pink. PERRLA. Ears:  Normal auditory acuity. Neck:  Supple; no masses or thyroidomegaly Lungs: Respirations even and unlabored. Lungs clear to auscultation bilaterally.   No wheezes, crackles, or rhonchi.  Heart:  Regular rate and rhythm;  Without murmur, clicks, rubs or gallops Abdomen:  Soft, nondistended, nontender. Normal bowel sounds. No appreciable masses or hepatomegaly.  No rebound or guarding.  Rectal:  Not performed. Msk:  Symmetrical without gross deformities.    Extremities:  Without edema, cyanosis or clubbing. Neurologic:  Alert and oriented x3;  grossly normal neurologically. Skin:  Intact without significant lesions or rashes. Cervical Nodes:  No significant cervical adenopathy. Psych:  Alert and cooperative. Normal affect.  LAB RESULTS: Recent Labs    08/24/23 0204 08/24/23 0910 08/24/23 1831 08/25/23 0054  WBC 5.9  --   --   --   HGB 11.5* 11.0* 11.3* 11.1*  HCT 35.2* 32.7* 34.3* 32.6*  PLT 443*  --   --   --    BMET Recent Labs    08/24/23 0204  NA 133*  K 3.9  CL 100  CO2 24  GLUCOSE 85  BUN 5*  CREATININE 0.63  CALCIUM 8.2*   LFT Recent Labs    08/24/23 0204  PROT 5.0*  ALBUMIN 2.0*  AST 15  ALT 17  ALKPHOS 61  BILITOT 0.7   PT/INR Recent Labs    08/24/23 0204  LABPROT 15.5*  INR 1.2    STUDIES: CT ABDOMEN PELVIS W CONTRAST  Result Date: 08/25/2023 CLINICAL DATA:  Ulcerative colitis.  Severe diarrhea. EXAM: CT ABDOMEN AND PELVIS WITH CONTRAST TECHNIQUE: Multidetector CT imaging of the abdomen and pelvis was performed using the standard protocol following bolus administration of intravenous contrast. RADIATION DOSE REDUCTION: This exam was performed according to the departmental dose-optimization program which includes automated exposure control, adjustment of the mA and/or kV according to patient size and/or use of iterative  reconstruction technique. CONTRAST:  OMNIPAQUE IOHEXOL 300 MG/ML  SOLN COMPARISON:  CT 08/04/2023 abdomen pelvis.  Chest 08/24/2023 FINDINGS: Lower chest: Since the prior there is developing bronchial wall thickening along the lower lobes dependently with some developing reticulonodular opacities, left-greater-than-right. Trace left pleural effusion. Please correlate for an infectious or inflammatory process. Please correlate with the prior CT Hepatobiliary: Mild fatty liver infiltration. Gallbladder has some dependent increased density, possibly sludge or stones. Patent portal vein. Pancreas: Unremarkable. No pancreatic ductal dilatation or surrounding inflammatory changes. Spleen: Normal in size without focal abnormality. Adrenals/Urinary Tract: Adrenal glands are preserved. No enhancing renal mass or collecting system dilatation. The ureters have normal course and caliber extending down to the urinary bladder. Significant contrast in the bladder itself. Please correlate with  previous CT angiogram of the chest from 08/24/2023 Stomach/Bowel: On this non oral contrast exam, the stomach and small bowel are nondilated. Rectal tube in place. There is significant wall thickening, mucosal enhancement along the rectosigmoid colon. Lesser extent along the transverse and descending colon although these loops of colon are more featureless than usually seen consistent with the history of ulcerative colitis. Adjacent areas of inflammatory stranding identified. Presacral fluid and stranding is increased from previous exam as well. Vascular/Lymphatic: Aortic atherosclerosis. No enlarged abdominal or pelvic lymph nodes. Reproductive: Prostate is unremarkable. Other: Anasarca.  No free air. Musculoskeletal: Curvature of the spine with moderate degenerative changes of the spine and pelvis. Bridging osteophytes along the sacroiliac joints. IMPRESSION: Increasing wall thickening, mucosal enhancement with the adjacent  inflammatory changes along the rectosigmoid colon greater than descending colon. There is a relatively long segment of featureless colon consistent with the history of ulcerative colitis. No obstruction, free air or free fluid . Tree-in-bud like changes along the lung bases with bronchiectasis. Please correlate with the prior CT scan Electronically Signed   By: Karen Kays M.D.   On: 08/25/2023 11:36   US Venous Img Upper Uni Left  Result Date: 08/24/2023 CLINICAL DATA:  Left arm pain and swelling EXAM: LEFT UPPER EXTREMITY VENOUS DOPPLER ULTRASOUND TECHNIQUE: Gray-scale sonography with graded compression, as well as color Doppler and duplex ultrasound were performed to evaluate the upper extremity deep venous system from the level of the subclavian vein and including the jugular, axillary, basilic, radial, ulnar and upper cephalic vein. Spectral Doppler was utilized to evaluate flow at rest and with distal augmentation maneuvers. COMPARISON:  None Available. FINDINGS: Contralateral Subclavian Vein: Respiratory phasicity is normal and symmetric with the symptomatic side. No evidence of thrombus. Normal compressibility. Internal Jugular Vein: Thrombus is noted with decreased compressibility. Subclavian Vein: No evidence of thrombus. Normal compressibility, respiratory phasicity and response to augmentation. Axillary Vein: No evidence of thrombus. Normal compressibility, respiratory phasicity and response to augmentation. Cephalic Vein: No evidence of thrombus. Normal compressibility, respiratory phasicity and response to augmentation. Basilic Vein: No evidence of thrombus. Normal compressibility, respiratory phasicity and response to augmentation. Brachial Veins: No evidence of thrombus. Normal compressibility, respiratory phasicity and response to augmentation. Radial Veins: No evidence of thrombus. Normal compressibility, respiratory phasicity and response to augmentation. Ulnar Veins: No evidence of thrombus.  Normal compressibility, respiratory phasicity and response to augmentation. Venous Reflux:  None visualized. Other Findings:  None visualized. IMPRESSION: Thrombus is noted in the left internal jugular vein. This is occlusive in nature. No other thrombus is seen. Electronically Signed   By: Alcide Clever M.D.   On: 08/24/2023 03:48   CT Angio Chest PE W and/or Wo Contrast  Result Date: 08/24/2023 CLINICAL DATA:  Pulmonary embolism. EXAM: CT ANGIOGRAPHY CHEST WITH CONTRAST TECHNIQUE: Multidetector CT imaging of the chest was performed using the standard protocol during bolus administration of intravenous contrast. Multiplanar CT image reconstructions and MIPs were obtained to evaluate the vascular anatomy. RADIATION DOSE REDUCTION: This exam was performed according to the departmental dose-optimization program which includes automated exposure control, adjustment of the mA and/or kV according to patient size and/or use of iterative reconstruction technique. CONTRAST:  75mL OMNIPAQUE IOHEXOL 350 MG/ML SOLN COMPARISON:  Chest CT 08/04/2023 FINDINGS: Cardiovascular: There is a filling defect within the right lower lobe segmental arterial branch. No other pulmonary embolism. No evidence of right heart strain. Mediastinum/Nodes: No enlarged mediastinal, hilar, or axillary lymph nodes. Thyroid gland, trachea, and esophagus demonstrate no significant  findings. Lungs/Pleura: Multifocal bibasilar nodular/tree-in-bud type opacities, new since 08/04/2023. No pleural effusion or pneumothorax. No pulmonary infarction. Central airways are patent. Upper Abdomen: No acute abnormality. Musculoskeletal: No chest wall abnormality. No acute or significant osseous findings. Review of the MIP images confirms the above findings. IMPRESSION: 1. Acute pulmonary embolism within a right lower lobe segmental arterial branch. No evidence of right heart strain. 2. Multifocal bibasilar nodular/tree-in-bud type opacities, new since 08/04/2023,  concerning for aspiration/infection Critical Value/emergent results were called by telephone at the time of interpretation on 08/24/2023 at 3:42 am to provider Shaune Pollack , who verbally acknowledged these results. Electronically Signed   By: Deatra Robinson M.D.   On: 08/24/2023 03:43      Impression / Plan:   Assessment: Principal Problem:   DVT (deep venous thrombosis) (HCC) Active Problems:   Ulcerative colitis, chronic (HCC)   HTN (hypertension)   HLD (hyperlipidemia)   Stroke (HCC)   Alcohol use   Pneumonia   [name] is a 62 y.o. y/o male with history of ulcerative colitis with a GI panel negative at the last admission with C. difficile also being negative this admission and last admission.  The patient has been having profuse diarrhea without relief.  The patient was started on mesalamine last admission but is unclear whether the patient went home with this prescription and whether he continued to take it as an outpatient.  It was also reported that the patient had been prescribed Balsalazide but also it was reported that he was not it.  It is also well-known that inflammatory bowel disease is associated with a hypercoagulable state with resulting clots.  Plan:  Diarrhea: The patient has a history of diagnosed ulcerative colitis without follow-up in the past.  The patient continues to have diarrhea and is now admitted with a PE and a thrombus in his left anterior jugular vein.  Due to the patient's GI panel being negative at the last admission and his C. difficile being negative last admission and this admission I think it is worth starting the patient on steroids to get rapid and better control of his diarrhea.  The patient will be started on steroids.  As stated at last admission the patient will need to follow-up with his GI doctor at [facility] clinic when discharged to be on definitive treatment for his ulcerative colitis.  The patient will also need a colonoscopy in the  future as an outpatient after his acute problems have been addressed.  Thank you for involving me in the care of this patient.      LOS: 1 day   Midge Minium, MD, [facility] 08/25/2023, 2:02 PM,  Pager [phone] 7am-5pm  Check AMION for 5pm -7am coverage and on weekends   Note: This dictation was prepared with Dragon dictation along with smaller phrase technology. Any transcriptional errors that result from this process are unintentional.

## 2023-08-25 NOTE — Progress Notes (Signed)
ANTICOAGULATION CONSULT NOTE  Pharmacy Consult for Heparin  Indication:  internal jugular DVT   Allergies  Allergen Reactions   Penicillins Anaphylaxis    Has patient had a PCN reaction causing immediate rash, facial/tongue/throat swelling, SOB or lightheadedness with hypotension: Yes Has patient had a PCN reaction causing severe rash involving mucus membranes or skin necrosis: No Has patient had a PCN reaction that required hospitalization No Has patient had a PCN reaction occurring within the last 10 years: No If all of the above answers are "NO", then may proceed with Cephalosporin use.   Cephalosporins Other (See Comments)    Reaction:  Unknown     Patient Measurements: Height: 5\' 10"  (177.8 cm) Weight: 72 kg (158 lb 11.2 oz) IBW/kg (Calculated) : 73 Heparin Dosing Weight: 74.4 kg   Vital Signs: Temp: 98.7 F (37.1 C) (09/16 0804) Temp Source: Oral (09/16 0804) BP: 106/82 (09/16 0630) Pulse Rate: 91 (09/16 0630)  Labs: Recent Labs    08/24/23 0204 08/24/23 0204 08/24/23 0336 08/24/23 0910 08/24/23 1831 08/25/23 0023 08/25/23 0054 08/25/23 0805  HGB 11.5*  --   --  11.0* 11.3*  --  11.1*  --   HCT 35.2*  --   --  32.7* 34.3*  --  32.6*  --   PLT 443*  --   --   --   --   --   --   --   APTT 30  --   --   --   --   --   --   --   LABPROT 15.5*  --   --   --   --   --   --   --   INR 1.2  --   --   --   --   --   --   --   HEPARINUNFRC  --    < >  --  0.16* 0.33 0.29*  --  0.45  CREATININE 0.63  --   --   --   --   --   --   --   TROPONINIHS  --   --  9  --   --   --   --   --    < > = values in this interval not displayed.    Estimated Creatinine Clearance: 98.8 mL/min (by C-G formula based on SCr of 0.63 mg/dL).   Medical History: Past Medical History:  Diagnosis Date   Dysphagia    Hyperlipidemia    Hypertension    Stroke (HCC)    Ulcerative colitis [facility])     Assessment: Pharmacy consulted to dose heparin in this 62 year old male admitted with  internal jugular DVT.  No prior anticoag noted. Baseline labs: INR 1.2, aPTT 30s  0915 1831  HL 0.33  therapeutic x1 0916 0023  HL 0.29  SUBtherapeutic  0916 0805 HL 0.45   therapeutic x 1  No issues with infusion reported, no signs/symptoms of bleeding noted.  Goal of Therapy:  Heparin level 0.3-0.7 units/ml Monitor platelets by anticoagulation protocol: Yes  Plan: 9/16: HL @ 0805 = 0.45, therapeutic  - Will continue current heparin rate of 1,750 units/hr - Will recheck HL in 6 hrs  - Continue to monitor CBC and signs/symptoms of bleeding   Thank you for involving pharmacy in this patient's care.   Merryl Hacker, PharmD Clinical Pharmacist 08/25/2023 8:32 AM

## 2023-08-25 NOTE — ED Notes (Signed)
Patient cleaned up after an episode of urinary incontinence x1. Linens changed and patient placed in a new brief. Patient was able to eat a couple of bites of a cookie off of his lunch tray.

## 2023-08-25 NOTE — ED Notes (Signed)
Informed RN bed assigned 

## 2023-08-25 NOTE — ED Notes (Signed)
This RN spoke to [employer] from [facility] to give her a patient update.

## 2023-08-25 NOTE — TOC Benefit Eligibility Note (Signed)
Patient Product/process development scientist completed.    The patient is insured through [facility]. Patient has Medicare and is not eligible for a copay card, but may be able to apply for patient assistance, if available.    Ran test claim for Eliquis 5 mg and the current 30 day co-pay is $0.00.   This test claim was processed through [facility]- copay amounts may vary at other pharmacies due to pharmacy/plan contracts, or as the patient moves through the different stages of their insurance plan.     Roland Earl, CPHT Pharmacy Technician III Certified Patient Advocate [facility] Pharmacy Patient Advocate Team Direct Number: [phone]  Fax: [phone]

## 2023-08-25 NOTE — Progress Notes (Signed)
ANTICOAGULATION CONSULT NOTE  Pharmacy Consult for Heparin  Indication:  internal jugular DVT   Allergies  Allergen Reactions   Penicillins Anaphylaxis    Has patient had a PCN reaction causing immediate rash, facial/tongue/throat swelling, SOB or lightheadedness with hypotension: Yes Has patient had a PCN reaction causing severe rash involving mucus membranes or skin necrosis: No Has patient had a PCN reaction that required hospitalization No Has patient had a PCN reaction occurring within the last 10 years: No If all of the above answers are "NO", then may proceed with Cephalosporin use.   Cephalosporins Other (See Comments)    Reaction:  Unknown     Patient Measurements: Height: 5\' 10"  (177.8 cm) Weight: 72 kg (158 lb 11.2 oz) IBW/kg (Calculated) : 73 Heparin Dosing Weight: 74.4 kg   Vital Signs: Temp: 98.3 F (36.8 C) (09/15 1759) Temp Source: Oral (09/15 1759) BP: 103/65 (09/16 0030) Pulse Rate: 93 (09/16 0030)  Labs: Recent Labs    08/24/23 0204 08/24/23 0336 08/24/23 0910 08/24/23 1831 08/25/23 0023 08/25/23 0054  HGB 11.5*  --  11.0* 11.3*  --  11.1*  HCT 35.2*  --  32.7* 34.3*  --  32.6*  PLT 443*  --   --   --   --   --   APTT 30  --   --   --   --   --   LABPROT 15.5*  --   --   --   --   --   INR 1.2  --   --   --   --   --   HEPARINUNFRC  --   --  0.16* 0.33 0.29*  --   CREATININE 0.63  --   --   --   --   --   TROPONINIHS  --  9  --   --   --   --     Estimated Creatinine Clearance: 98.8 mL/min (by C-G formula based on SCr of 0.63 mg/dL).   Medical History: Past Medical History:  Diagnosis Date   Dysphagia    Hyperlipidemia    Hypertension    Stroke (HCC)    Ulcerative colitis [facility])     Assessment: Pharmacy consulted to dose heparin in this 62 year old male admitted with internal jugular DVT.  No prior anticoag noted. Baseline labs: INR 1.2, aPTT 30s  0915 1831  HL 0.33  therapeutic x1 0916 0023  HL 0.29  SUBtherapeutic   Goal of  Therapy:  Heparin level 0.3-0.7 units/ml Monitor platelets by anticoagulation protocol: Yes  Heparin level is therapeutic at 0.33 on 1600 units/hour. No issues with infusion reported, no signs/symptoms of bleeding noted.   Plan: 9/16: HL @ 0023 = 0.29, SUBtherapeutic  - will order heparin 1100 units and increase drip rate to 1750 units/hr - Will recheck HL 6 hrs after rate change Monitor CBC and signs/symptoms of bleeding   Thank you for involving pharmacy in this patient's care.   Mliss Wedin D Clinical Pharmacist 08/25/2023 1:23 AM

## 2023-08-25 NOTE — Progress Notes (Addendum)
[facility] Progress Note  Patient: [name]    FAO:[id number]  DOA: 08/24/2023     Date of Service: the patient was seen and examined on 08/25/2023  Chief Complaint  Patient presents with   DVT   Brief hospital course: [name] is a 62 y.o. male with medical history significant of ulcerative colitis, CVA, hypertension, hyperlipidemia presented with internal jugular DVT.  Patient noted to have been recently admitted at the end of August for ulcerative colitis associated GI bleeding as well as sepsis secondary to UTI.  Patient reports roughly 1 to 2 weeks of worsening lower extremity swelling and generalized malaise.  No chest pain or shortness of breath.  No nausea or vomiting.  No hemiparesis or confusion.  States swelling and discomfort has been progressively worsening.  Noted prior alcohol use.  Patient denies abstinence for several months.  No reported tobacco use Presented to the ER afebrile, hemodynamically stable.  Satting well on room air.  White count 5.9, hemoglobin 9.5, platelets 443.  Lactate 1.1.  Troponin 9.  Creatinine 0.63.  CTA with acute pulmonary embolism within the right lower lobe segment as well as multifocal lower bibasilar tree-in-bud opacities concerning for aspiration versus infection.  Ultrasound with noted thrombus noted in the left internal jugular vein.  Positive occlusion.   Assessment and Plan:  # Acute Pulmonary embolism # DVT (deep venous thrombosis) left internal jugular + occlusive internal jugular DVT on imaging  Vascular surgery was consulted, recommended no intervention, continue anticoagulation. Continue heparin gtt, monitor PTT as per protocol, transition to DOAC when stable Hgb stable 11.5 in setting of recent of admission for UC assd GI bleeding  Monitor H&H Follow-up venous duplex bilateral lower extremity and right arm due to edema    # Ulcerative colitis flareup Noted admission 8/26--->8/31 for UC assd GI bleeding  Pt  denies any active GI bleeding  Hgb stable at 11.5  Pt was on balsalazide, but currently he is not taking any medication Difficult diarrhea, rectal tube was inserted yesterday on 9/15 C. difficile negative, GI pathogen pending CT A/P: Increasing wall thickening, mucosal enhancement with the adjacent inflammatory changes along the rectosigmoid colon greater than descending colon. There is a relatively long segment of featureless colon consistent with the history of ulcerative colitis. No obstruction, free air or free fluid . GI consulted for further recommendation   # Community-acquired pneumonia, incidental finding on CTA chest Clinically no significant symptoms Continue Levaquin 750 mg IV daily, transition to oral antibiotics when stable.   # H/o Stroke (HCC) On plavix at home  Now on heparin in setting of occlusive internal jugular DVT  Hold plavix for now given recent history of UC assd GI bleeding  Follow cbc     # HTN and HLD BP stable, without antihypertensive medication. Currently patient is not on any medication at home. Monitor BP Follow lipid profile  H/o Alcohol use Prior heavy alcohol use  Pt states etoh abstinence of several months Monitor for any withdrawal symptoms    Body mass index is 22.77 kg/m.  Interventions:  Diet: soft diet DVT Prophylaxis: Therapeutic Anticoagulation with Hep infusion    Advance goals of care discussion: Full code  Family Communication: family was not present at bedside, at the time of interview.  The pt provided permission to discuss medical plan with the family. Opportunity was given to ask question and all questions were answered satisfactorily.   Disposition:  Pt is from Home, admitted with PE and  DVT LUEx, still on Hep gtt, which precludes a safe discharge. Discharge to Home, when stable, most likely in 3 to 5 days.  Subjective: No significant events overnight, patient has rectal tube in place due to diarrhea, denies any  abdominal pain, no chest pain or palpitation, no shortness of breath.  Patient still has significant edema in the bilateral upper extremities and also swelling of the bilateral feet.  Physical Exam: General: NAD, lying comfortably Appear in no distress, affect appropriate Eyes: PERRLA ENT: Oral Mucosa Clear, moist  Neck: no JVD,  Cardiovascular: S1 and S2 Present, no Murmur,  Respiratory: good respiratory effort, Bilateral Air entry equal and Decreased, no Crackles, no wheezes Abdomen: Bowel Sound present, Soft and no tenderness,  Skin: no rashes Extremities: 3-4+ pedal edema, no calf tenderness.  Bilateral upper extremity edema Left >Right  Neurologic: without any new focal findings Gait not checked due to patient safety concerns  Vitals:   08/25/23 1100 08/25/23 1130 08/25/23 1150 08/25/23 1200  BP: 101/70 100/75  103/63  Pulse: 92 93  (!) 102  Resp: (!) 25 (!) 21  (!) 21  Temp:   99 F (37.2 C)   TempSrc:   Oral   SpO2: 99% 97%  99%  Weight:      Height:        Intake/Output Summary (Last 24 hours) at 08/25/2023 1314 Last data filed at 08/25/2023 3086 Gross per 24 hour  Intake 1165.99 ml  Output 750 ml  Net 415.99 ml   Filed Weights   08/24/23 0215 08/24/23 0222  Weight: 74.4 kg 72 kg    Data Reviewed: I have personally reviewed and interpreted daily labs, tele strips, imagings as discussed above. I reviewed all nursing notes, pharmacy notes, vitals, pertinent old records I have discussed plan of care as described above with RN and patient/family.  CBC: Recent Labs  Lab 08/24/23 0204 08/24/23 0910 08/24/23 1831 08/25/23 0054  WBC 5.9  --   --   --   NEUTROABS 3.2  --   --   --   HGB 11.5* 11.0* 11.3* 11.1*  HCT 35.2* 32.7* 34.3* 32.6*  MCV 88.0  --   --   --   PLT 443*  --   --   --    Basic Metabolic Panel: Recent Labs  Lab 08/24/23 0204  NA 133*  K 3.9  CL 100  CO2 24  GLUCOSE 85  BUN 5*  CREATININE 0.63  CALCIUM 8.2*    Studies: CT  ABDOMEN PELVIS W CONTRAST  Result Date: 08/25/2023 CLINICAL DATA:  Ulcerative colitis.  Severe diarrhea. EXAM: CT ABDOMEN AND PELVIS WITH CONTRAST TECHNIQUE: Multidetector CT imaging of the abdomen and pelvis was performed using the standard protocol following bolus administration of intravenous contrast. RADIATION DOSE REDUCTION: This exam was performed according to the departmental dose-optimization program which includes automated exposure control, adjustment of the mA and/or kV according to patient size and/or use of iterative reconstruction technique. CONTRAST:  OMNIPAQUE IOHEXOL 300 MG/ML  SOLN COMPARISON:  CT 08/04/2023 abdomen pelvis.  Chest 08/24/2023 FINDINGS: Lower chest: Since the prior there is developing bronchial wall thickening along the lower lobes dependently with some developing reticulonodular opacities, left-greater-than-right. Trace left pleural effusion. Please correlate for an infectious or inflammatory process. Please correlate with the prior CT Hepatobiliary: Mild fatty liver infiltration. Gallbladder has some dependent increased density, possibly sludge or stones. Patent portal vein. Pancreas: Unremarkable. No pancreatic ductal dilatation or surrounding inflammatory changes. Spleen: Normal in  size without focal abnormality. Adrenals/Urinary Tract: Adrenal glands are preserved. No enhancing renal mass or collecting system dilatation. The ureters have normal course and caliber extending down to the urinary bladder. Significant contrast in the bladder itself. Please correlate with previous CT angiogram of the chest from 08/24/2023 Stomach/Bowel: On this non oral contrast exam, the stomach and small bowel are nondilated. Rectal tube in place. There is significant wall thickening, mucosal enhancement along the rectosigmoid colon. Lesser extent along the transverse and descending colon although these loops of colon are more featureless than usually seen consistent with the history of  ulcerative colitis. Adjacent areas of inflammatory stranding identified. Presacral fluid and stranding is increased from previous exam as well. Vascular/Lymphatic: Aortic atherosclerosis. No enlarged abdominal or pelvic lymph nodes. Reproductive: Prostate is unremarkable. Other: Anasarca.  No free air. Musculoskeletal: Curvature of the spine with moderate degenerative changes of the spine and pelvis. Bridging osteophytes along the sacroiliac joints. IMPRESSION: Increasing wall thickening, mucosal enhancement with the adjacent inflammatory changes along the rectosigmoid colon greater than descending colon. There is a relatively long segment of featureless colon consistent with the history of ulcerative colitis. No obstruction, free air or free fluid . Tree-in-bud like changes along the lung bases with bronchiectasis. Please correlate with the prior CT scan Electronically Signed   By: Karen Kays M.D.   On: 08/25/2023 11:36    Scheduled Meds:  melatonin  2.5 mg Oral QHS   Continuous Infusions:  sodium chloride 50 mL/hr at 08/25/23 0751   heparin 1,750 Units/hr (08/25/23 0725)   levofloxacin (LEVAQUIN) IV Stopped (08/25/23 0922)   PRN Meds: HYDROmorphone (DILAUDID) injection, ondansetron **OR** ondansetron (ZOFRAN) IV  Time spent: 55 minutes  Author: Gillis Santa. MD [facility] 08/25/2023 1:14 PM  To reach On-call, see care teams to locate the attending and reach out to them via [email or web address]. If 7PM-7AM, please contact night-coverage If you still have difficulty reaching the attending provider, please page the [facility] (Director on Call) for [facility] on amion for assistance.

## 2023-08-25 NOTE — Progress Notes (Signed)
ANTICOAGULATION CONSULT NOTE  Pharmacy Consult for Heparin  Indication:  internal jugular DVT   Allergies  Allergen Reactions   Penicillins Anaphylaxis    Has patient had a PCN reaction causing immediate rash, facial/tongue/throat swelling, SOB or lightheadedness with hypotension: Yes Has patient had a PCN reaction causing severe rash involving mucus membranes or skin necrosis: No Has patient had a PCN reaction that required hospitalization No Has patient had a PCN reaction occurring within the last 10 years: No If all of the above answers are "NO", then may proceed with Cephalosporin use.   Cephalosporins Other (See Comments)    Reaction:  Unknown     Patient Measurements: Height: 5\' 10"  (177.8 cm) Weight: 72 kg (158 lb 11.2 oz) IBW/kg (Calculated) : 73 Heparin Dosing Weight: 74.4 kg   Vital Signs: Temp: 99 F (37.2 C) (09/16 1150) Temp Source: Oral (09/16 1150) BP: 105/76 (09/16 1400) Pulse Rate: 108 (09/16 1400)  Labs: Recent Labs    08/24/23 0204 08/24/23 0204 08/24/23 0336 08/24/23 0910 08/24/23 1831 08/25/23 0023 08/25/23 0054 08/25/23 0805 08/25/23 1415  HGB 11.5*  --   --  11.0* 11.3*  --  11.1*  --   --   HCT 35.2*  --   --  32.7* 34.3*  --  32.6*  --   --   PLT 443*  --   --   --   --   --   --   --   --   APTT 30  --   --   --   --   --   --   --   --   LABPROT 15.5*  --   --   --   --   --   --   --   --   INR 1.2  --   --   --   --   --   --   --   --   HEPARINUNFRC  --    < >  --  0.16* 0.33 0.29*  --  0.45 0.39  CREATININE 0.63  --   --   --   --   --   --   --   --   TROPONINIHS  --   --  9  --   --   --   --   --   --    < > = values in this interval not displayed.    Estimated Creatinine Clearance: 98.8 mL/min (by C-G formula based on SCr of 0.63 mg/dL).   Medical History: Past Medical History:  Diagnosis Date   Dysphagia    Hyperlipidemia    Hypertension    Stroke (HCC)    Ulcerative colitis [facility])     Assessment: Pharmacy  consulted to dose heparin in this 62 year old male admitted with internal jugular DVT.  No prior anticoag noted. Baseline labs: INR 1.2, aPTT 30s  0915 1831 HL 0.33 Therapeutic x1 0916 0023 HL 0.29 SUBtherapeutic  0916 0805 HL 0.45 Therapeutic x 1 0916 1415 HL 0.39 Therapeutic x 2  No issues with infusion reported, no signs/symptoms of bleeding noted.  Goal of Therapy:  Heparin level 0.3-0.7 units/ml Monitor platelets by anticoagulation protocol: Yes   PLAN: Continue heparin infusion at 1750 units/hour. Check HL next with AM labs tomorrow Continue to monitor CBC daily while on heparin infusion.   Will M. Dareen Piano, PharmD Clinical Pharmacist 08/25/2023 3:02 PM

## 2023-08-25 NOTE — ED Notes (Signed)
Informed provider that patient does not appear to have had urine output at this time. Bladder scan greater than 600. Unable to determine if urination prior to shift. No new orders received for patient. Monitoring ongoing.

## 2023-08-25 NOTE — Plan of Care (Signed)

## 2023-08-25 NOTE — Plan of Care (Signed)
Problem: Education: Goal: Knowledge of General Education information will improve Description: Including pain rating scale, medication(s)/side effects and non-pharmacologic comfort measures Outcome: Progressing   Problem: Pain Managment: Goal: General experience of comfort will improve Outcome: Progressing   Problem: Safety: Goal: Ability to remain free from injury will improve Outcome: Progressing

## 2023-08-26 DIAGNOSIS — I10 Essential (primary) hypertension: Secondary | ICD-10-CM | POA: Diagnosis not present

## 2023-08-26 DIAGNOSIS — I82403 Acute embolism and thrombosis of unspecified deep veins of lower extremity, bilateral: Secondary | ICD-10-CM | POA: Diagnosis not present

## 2023-08-26 DIAGNOSIS — K51918 Ulcerative colitis, unspecified with other complication: Secondary | ICD-10-CM | POA: Diagnosis not present

## 2023-08-26 DIAGNOSIS — I8290 Acute embolism and thrombosis of unspecified vein: Secondary | ICD-10-CM | POA: Diagnosis not present

## 2023-08-26 DIAGNOSIS — Z789 Other specified health status: Secondary | ICD-10-CM

## 2023-08-26 LAB — CBC
HCT: 33.6 % — ABNORMAL LOW (ref 39.0–52.0)
Hemoglobin: 11.4 g/dL — ABNORMAL LOW (ref 13.0–17.0)
MCH: 29 pg (ref 26.0–34.0)
MCHC: 33.9 g/dL (ref 30.0–36.0)
MCV: 85.5 fL (ref 80.0–100.0)
Platelets: 358 10*3/uL (ref 150–400)
RBC: 3.93 MIL/uL — ABNORMAL LOW (ref 4.22–5.81)
RDW: 15 % (ref 11.5–15.5)
WBC: 6.3 10*3/uL (ref 4.0–10.5)
nRBC: 0 % (ref 0.0–0.2)

## 2023-08-26 LAB — BASIC METABOLIC PANEL
Anion gap: 8 (ref 5–15)
BUN: 8 mg/dL (ref 8–23)
CO2: 23 mmol/L (ref 22–32)
Calcium: 8.3 mg/dL — ABNORMAL LOW (ref 8.9–10.3)
Chloride: 101 mmol/L (ref 98–111)
Creatinine, Ser: 0.54 mg/dL — ABNORMAL LOW (ref 0.61–1.24)
GFR, Estimated: >60 mL/min (ref >60)
Glucose, Bld: 123 mg/dL — ABNORMAL HIGH (ref 70–99)
Potassium: 3.7 mmol/L (ref 3.5–5.1)
Sodium: 132 mmol/L — ABNORMAL LOW (ref 135–145)

## 2023-08-26 LAB — PHOSPHORUS: Phosphorus: 3.8 mg/dL (ref 2.5–4.6)

## 2023-08-26 LAB — MAGNESIUM: Magnesium: 1.9 mg/dL (ref 1.7–2.4)

## 2023-08-26 LAB — HEPARIN LEVEL (UNFRACTIONATED): Heparin Unfractionated: 0.35 [IU]/mL (ref 0.30–0.70)

## 2023-08-26 MED ORDER — VITAMIN D 25 MCG (1000 UNIT) PO TABS
5000.0000 [IU] | ORAL_TABLET | Freq: Every day | ORAL | Status: DC
  Administered 2023-08-27 – 2023-08-28 (×2): 5000 [IU] via ORAL
  Filled 2023-08-26: qty 5
  Filled 2023-08-26: qty 1
  Filled 2023-08-26: qty 5

## 2023-08-26 MED ORDER — ZINC SULFATE 220 (50 ZN) MG PO CAPS
220.0000 mg | ORAL_CAPSULE | Freq: Every day | ORAL | Status: DC
  Administered 2023-08-27 – 2023-08-28 (×2): 220 mg via ORAL
  Filled 2023-08-26 (×2): qty 1

## 2023-08-26 MED ORDER — ADULT MULTIVITAMIN W/MINERALS CH
1.0000 | ORAL_TABLET | Freq: Every day | ORAL | Status: DC
  Administered 2023-08-27 – 2023-08-28 (×2): 1 via ORAL
  Filled 2023-08-26 (×2): qty 1

## 2023-08-26 MED ORDER — VITAMIN C 500 MG PO TABS
500.0000 mg | ORAL_TABLET | Freq: Two times a day (BID) | ORAL | Status: DC
  Administered 2023-08-27 – 2023-08-28 (×3): 500 mg via ORAL
  Filled 2023-08-26 (×3): qty 1

## 2023-08-26 MED ORDER — THIAMINE MONONITRATE 100 MG PO TABS
100.0000 mg | ORAL_TABLET | Freq: Every day | ORAL | Status: DC
  Administered 2023-08-27 – 2023-08-28 (×2): 100 mg via ORAL
  Filled 2023-08-26 (×2): qty 1

## 2023-08-26 MED ORDER — FOLIC ACID 1 MG PO TABS
1.0000 mg | ORAL_TABLET | Freq: Every day | ORAL | Status: DC
  Administered 2023-08-27 – 2023-08-28 (×2): 1 mg via ORAL
  Filled 2023-08-26 (×2): qty 1

## 2023-08-26 MED ORDER — BOOST / RESOURCE BREEZE PO LIQD CUSTOM
1.0000 | Freq: Three times a day (TID) | ORAL | Status: DC
  Administered 2023-08-28 (×2): 1 via ORAL

## 2023-08-26 MED ORDER — TRAZODONE HCL 100 MG PO TABS
100.0000 mg | ORAL_TABLET | Freq: Every evening | ORAL | Status: DC | PRN
  Administered 2023-08-27: 100 mg via ORAL
  Filled 2023-08-26: qty 1

## 2023-08-26 MED ORDER — GERHARDT'S BUTT CREAM
TOPICAL_CREAM | Freq: Two times a day (BID) | CUTANEOUS | Status: DC
  Filled 2023-08-26: qty 1

## 2023-08-26 MED ORDER — MUPIROCIN 2 % EX OINT
TOPICAL_OINTMENT | Freq: Two times a day (BID) | CUTANEOUS | Status: DC
  Administered 2023-08-27: 1 via TOPICAL
  Filled 2023-08-26: qty 22

## 2023-08-26 MED ORDER — COLLAGENASE 250 UNIT/GM EX OINT
TOPICAL_OINTMENT | Freq: Every day | CUTANEOUS | Status: DC
  Filled 2023-08-26: qty 30

## 2023-08-26 MED ORDER — PROSOURCE PLUS PO LIQD
30.0000 mL | Freq: Two times a day (BID) | ORAL | Status: DC
  Filled 2023-08-26: qty 30

## 2023-08-26 NOTE — NC FL2 (Signed)
[place] MEDICAID FL2 LEVEL OF CARE FORM     IDENTIFICATION  Patient Name: [name] Birthdate: 11/28/1961 Sex: male Admission Date (Current Location): 08/24/2023  [facility] and [place] Number:  Chiropodist and Address:  [facility], [address] [place], [place] [address]      Provider Number: [id number]  Attending Physician Name and Address:  [name] Duster, MD  Relative Name and Phone Number:  Karilyn Cota (friend) [phone]    Current Level of Care: Hospital Recommended Level of Care: Skilled Nursing Facility Prior Approval Number:    Date Approved/Denied:   PASRR Number: [id number]  Discharge Plan: SNF    Current Diagnoses: Patient Active Problem List   Diagnosis Date Noted   DVT (deep venous thrombosis) (HCC) 08/24/2023   Pneumonia 08/24/2023   Protein-calorie malnutrition, severe 08/07/2023   GI bleeding 08/04/2023   SIRS (systemic inflammatory response syndrome) (HCC) 08/04/2023   Acute metabolic encephalopathy 08/04/2023   Fall at home, initial encounter 08/04/2023   AKI (acute kidney injury) (HCC) 08/04/2023   Rhabdomyolysis 08/04/2023   Myocardial injury 08/04/2023   Abnormal LFTs 08/04/2023   HTN (hypertension) 08/04/2023   HLD (hyperlipidemia) 08/04/2023   Stroke (HCC) 08/04/2023   Severe sepsis (HCC) 08/04/2023   UTI (urinary tract infection) 08/04/2023   Alcohol use 08/04/2023   Slurred speech 08/04/2023   Pulmonary nodule 08/04/2023   Gastrointestinal bleed 12/10/2015   Hyponatremia 12/10/2015   Leukopenia 12/10/2015   Hyperglycemia 12/10/2015   Diarrhea 12/10/2015   Ulcerative colitis (HCC) 12/10/2015   Acute blood loss anemia 12/09/2015   Exacerbation of ulcerative colitis (HCC) 11/22/2015   Ulcerative colitis, chronic (HCC) 11/22/2015    Orientation RESPIRATION BLADDER Height & Weight     Self, Time, Place  Normal Incontinent, External catheter Weight: 158 lb 11.2 oz (72  kg) Height:  5\' 10"  (177.8 cm)  BEHAVIORAL SYMPTOMS/MOOD NEUROLOGICAL BOWEL NUTRITION STATUS      Incontinent Diet (see discharge summary)  AMBULATORY STATUS COMMUNICATION OF NEEDS Skin   Extensive Assist Verbally Other (Comment) (presure injury sacrum, abrasion arm, face, leg, pressure injury buttocks and right hip)                       Personal Care Assistance Level of Assistance  Feeding, Bathing, Dressing, Total care Bathing Assistance: Limited assistance Feeding assistance: Limited assistance Dressing Assistance: Maximum assistance Total Care Assistance: Maximum assistance   Functional Limitations Info  Sight, Hearing, Speech Sight Info: Impaired Hearing Info: Adequate Speech Info: Adequate    SPECIAL CARE FACTORS FREQUENCY  PT (By licensed PT), OT (By licensed OT)     PT Frequency: min 4x weekly OT Frequency: min 4x weekly            Contractures Contractures Info: Not present    Additional Factors Info  Code Status, Allergies Code Status Info: full Allergies Info: penicillins, cephalosporins           Current Medications (08/26/2023):  This is the current hospital active medication list Current Facility-Administered Medications  Medication Dose Route Frequency Provider Last Rate Last Admin   0.9 %  sodium chloride infusion   Intravenous Continuous Floydene Flock, MD 50 mL/hr at 08/25/23 1838 New Bag at 08/25/23 1838   collagenase (SANTYL) ointment   Topical Daily [name] Duster, MD       Gerhardt's butt cream   Topical BID [name] Duster, MD       heparin ADULT infusion 100 units/mL (25000  units/285mL)  1,750 Units/hr Intravenous Continuous Floydene Flock, MD 17.5 mL/hr at 08/25/23 1913 1,750 Units/hr at 08/25/23 1913   HYDROmorphone (DILAUDID) injection 0.5 mg  0.5 mg Intravenous Q4H PRN Andris Baumann, MD       levofloxacin (LEVAQUIN) IVPB 750 mg  750 mg Intravenous Q24H Floydene Flock, MD 100 mL/hr at 08/26/23 0816 750 mg at  08/26/23 0816   melatonin tablet 2.5 mg  2.5 mg Oral QHS Lindajo Royal V, MD   2.5 mg at 08/24/23 2131   methylPREDNISolone sodium succinate (SOLU-MEDROL) 40 mg/mL injection 40 mg  40 mg Intravenous Q12H Midge Minium, MD   40 mg at 08/26/23 0207   mupirocin ointment (BACTROBAN) 2 %   Topical BID [name] Duster, MD       ondansetron (ZOFRAN) tablet 4 mg  4 mg Oral Q6H PRN Floydene Flock, MD       Or   ondansetron [facility]) injection 4 mg  4 mg Intravenous Q6H PRN Floydene Flock, MD         Discharge Medications: Please see discharge summary for a list of discharge medications.  Relevant Imaging Results:  Relevant Lab Results:   Additional Information SS# [id number]  Darolyn Rua, LCSW

## 2023-08-26 NOTE — TOC Initial Note (Addendum)
Transition of Care [facility]) - Initial/Assessment Note    Patient Details  Name: [name] MRN: [id number] Date of Birth: [birth year]  Transition of Care [facility]) CM/SW Contact:    Darolyn Rua, LCSW Phone Number: 08/26/2023, 10:03 AM  Clinical Narrative:                  Update 12:03pm: Patient girlfriend Britta Mccreedy called back to report that her and patient's brother Aurther Loft are in agreement with patient returning to [facility] at discharge as this is closer to them.       CSW spoke with patient at bedside to review SNF recommendations, he reports he doesn't care where he goes.   CSW reminded him that last admission he went to [facility] but wanted [facility], we can send referrals out for bed offers and let him know. He once again states he doesn't care.   CSW asked if there are any family members we can call, in agreement with calling girlfriend Britta Mccreedy,   CSW called [facility], no answer, lvm.   Referrals sent to STR facilities pending bed offers at this time.   Expected Discharge Plan: Skilled Nursing Facility Barriers to Discharge: Continued Medical Work up   Patient Goals and CMS Choice Patient states their goals for this hospitalization and ongoing recovery are:: to go home CMS Medicare.gov Compare Post Acute Care list provided to:: Patient Choice offered to / list presented to : Patient      Expected Discharge Plan and Services       Living arrangements for the past 2 months: Single Family Home                                      Prior Living Arrangements/Services Living arrangements for the past 2 months: Single Family Home Lives with:: Significant Other                   Activities of Daily Living Home Assistive Devices/Equipment: Environmental consultant (specify type) ADL Screening (condition at time of admission) Patient's cognitive ability adequate to safely complete daily activities?: No Is the patient deaf or have difficulty hearing?: No Does the patient have  difficulty seeing, even when wearing glasses/contacts?: No Does the patient have difficulty concentrating, remembering, or making decisions?: Yes Patient able to express need for assistance with ADLs?: Yes Does the patient have difficulty dressing or bathing?: No Independently performs ADLs?: No Communication: Independent Dressing (OT): Needs assistance Is this a change from baseline?: Pre-admission baseline Grooming: Needs assistance Is this a change from baseline?: Pre-admission baseline Feeding: Independent Bathing: Needs assistance Is this a change from baseline?: Pre-admission baseline Toileting: Needs assistance Is this a change from baseline?: Pre-admission baseline In/Out Bed: Needs assistance Is this a change from baseline?: Pre-admission baseline Walks in Home: Needs assistance Is this a change from baseline?: Pre-admission baseline Does the patient have difficulty walking or climbing stairs?: Yes Weakness of Legs: Both Weakness of Arms/Hands: Both  Permission Sought/Granted                  Emotional Assessment              Admission diagnosis:  DVT (deep venous thrombosis) (HCC) [I82.409] Acute deep vein thrombosis (DVT) of non-extremity vein [I82.90] Single subsegmental pulmonary embolism without acute cor pulmonale (HCC) [I26.93] Patient Active Problem List   Diagnosis Date Noted   DVT (deep venous thrombosis) (HCC) 08/24/2023  Pneumonia 08/24/2023   Protein-calorie malnutrition, severe 08/07/2023   GI bleeding 08/04/2023   SIRS (systemic inflammatory response syndrome) (HCC) 08/04/2023   Acute metabolic encephalopathy 08/04/2023   Fall at home, initial encounter 08/04/2023   AKI (acute kidney injury) (HCC) 08/04/2023   Rhabdomyolysis 08/04/2023   Myocardial injury 08/04/2023   Abnormal LFTs 08/04/2023   HTN (hypertension) 08/04/2023   HLD (hyperlipidemia) 08/04/2023   Stroke (HCC) 08/04/2023   Severe sepsis (HCC) 08/04/2023   UTI (urinary  tract infection) 08/04/2023   Alcohol use 08/04/2023   Slurred speech 08/04/2023   Pulmonary nodule 08/04/2023   Gastrointestinal bleed 12/10/2015   Hyponatremia 12/10/2015   Leukopenia 12/10/2015   Hyperglycemia 12/10/2015   Diarrhea 12/10/2015   Ulcerative colitis (HCC) 12/10/2015   Acute blood loss anemia 12/09/2015   Exacerbation of ulcerative colitis (HCC) 11/22/2015   Ulcerative colitis, chronic (HCC) 11/22/2015   PCP:  System, Provider Not In Pharmacy:   [facility] [address], [place] - [address] [place] [address] Phone: [phone] Fax: [phone]     Social Determinants of Health (SDOH) Social History: SDOH Screenings   Food Insecurity: No Food Insecurity (08/25/2023)  Housing: Low Risk  (08/25/2023)  Transportation Needs: No Transportation Needs (08/25/2023)  Recent Concern: Transportation Needs - Unmet Transportation Needs (08/04/2023)  Utilities: Not At Risk (08/25/2023)  Tobacco Use: Low Risk  (08/25/2023)   SDOH Interventions:     Readmission Risk Interventions     No data to display

## 2023-08-26 NOTE — Progress Notes (Addendum)
Progress Note   Patient: [name] JXB:[id number] DOB: [birth year] DOA: 08/24/2023     2 DOS: the patient was seen and examined on 08/26/2023   Brief hospital course: VINCENT BRIDWELL is a 62 y.o. male with medical history significant of ulcerative colitis, CVA, hypertension, hyperlipidemia presented with internal jugular DVT.  Patient noted to have been recently admitted at the end of August for ulcerative colitis associated GI bleeding as well as sepsis secondary to UTI.  Patient reports roughly 1 to 2 weeks of worsening lower extremity swelling and generalized malaise.  CTA with acute pulmonary embolism within the right lower lobe segment as well as multifocal lower bibasilar tree-in-bud opacities concerning for aspiration versus infection. Ultrasound with noted thrombus noted in the left internal jugular vein. Positive occlusion. Patient is started on heparin drip per protocol and admitted for further management.  Assessment and Plan: Acute pulmonary embolism * DVT (deep venous thrombosis) (HCC) + occlusive internal jugular DVT on imaging  +occlusive thrombus right cephalic vein No vascular intervention per vascular team. continue on heparin gtt, follow PT/ aPTT. Plan to change to DOAC upon discharge. Monitor h/h daily given h/o GI bleed, UL flare up.  Ulcerative colitis, chronic (HCC) Noted admission 8/26--->8/31 for UC assd GI bleeding  No active GI bleeding  Hgb stable at 11.5  Cont balsalazide. C. Diff negative. GI evaluation appreciated, started on IV steroids. Outpatient GI follow up suggested.  Stroke [facility]) Hx/o CVA  On plavix   HTN (hypertension) BP stable  Continue home regimen   HLD (hyperlipidemia) Cont statin   Alcohol use Prior heavy alcohol use  Pt states etoh abstinence of several months Thiamine, folate, multivitamin.  Pneumonia CTA and ultrasound with noted internal jugular DVT also with imaging concerning for new multi lobar pneumonia IV levoquin  to be changed to oral tomorrow.  Multiple pressure ulcers. Sacrum, L buttock, right hip Continue wound care.      Subjective: Patient is seen and examined today morning. He is lying in bed, has extremity swelling. Eating poor, energy poor.   Physical Exam: Vitals:   08/26/23 0157 08/26/23 0904 08/26/23 1531 08/26/23 1603  BP: 101/79 102/78 111/82   Pulse: 90 90 100   Resp: 18 18 18    Temp: 97.6 F (36.4 C) 97.9 F (36.6 C) 97.9 F (36.6 C)   TempSrc:      SpO2: 98% 100% 99%   Weight:    75.2 kg  Height:       General - Elderly Caucasian ill male, no apparent distress HEENT - PERRLA, EOMI, pale conjunctiva, atraumatic head, non tender sinuses. Lung - Clear, diffuse rhonchi Heart - S1, S2 heard, no murmurs, rubs, 1+ pedal edema Neuro - Alert, awake and oriented x 3, non focal exam. Skin - Warm and dry. All extremities edematous Data Reviewed:     Latest Ref Rng & Units 08/26/2023    6:24 AM 08/25/2023   12:54 AM 08/24/2023    6:31 PM  CBC  WBC 4.0 - 10.5 K/uL 6.3     Hemoglobin 13.0 - 17.0 g/dL 13.0  86.5  78.4   Hematocrit 39.0 - 52.0 % 33.6  32.6  34.3   Platelets 150 - 400 K/uL 358          Latest Ref Rng & Units 08/26/2023    6:24 AM 08/25/2023    9:48 PM 08/24/2023    2:04 AM  BMP  Glucose 70 - 99 mg/dL 696  295  85  BUN 8 - 23 mg/dL 8  7  5    Creatinine 0.61 - 1.24 mg/dL 7.82  9.56  2.13   Sodium 135 - 145 mmol/L 132  128  133   Potassium 3.5 - 5.1 mmol/L 3.7  3.8  3.9   Chloride 98 - 111 mmol/L 101  97  100   CO2 22 - 32 mmol/L 23  21  24    Calcium 8.9 - 10.3 mg/dL 8.3  8.0  8.2    US Venous Img Upper Uni Right(DVT)  Result Date: 08/25/2023 CLINICAL DATA:  Edema for 2 weeks, bilateral lower extremity DVT and left internal jugular vein thrombus EXAM: RIGHT UPPER EXTREMITY VENOUS DOPPLER ULTRASOUND TECHNIQUE: Gray-scale sonography with graded compression, as well as color Doppler and duplex ultrasound were performed to evaluate the upper extremity deep  venous system from the level of the subclavian vein and including the jugular, axillary, basilic, radial, ulnar and upper cephalic vein. Spectral Doppler was utilized to evaluate flow at rest and with distal augmentation maneuvers. COMPARISON:  08/25/2023, 08/24/2023 FINDINGS: Contralateral Subclavian Vein: Respiratory phasicity is normal and symmetric with the symptomatic side. No evidence of thrombus. Normal compressibility. Internal Jugular Vein: No evidence of thrombus. Normal compressibility, respiratory phasicity and response to augmentation. Subclavian Vein: No evidence of thrombus. Normal compressibility, respiratory phasicity and response to augmentation. Axillary Vein: No evidence of thrombus. Normal compressibility, respiratory phasicity and response to augmentation. Cephalic Vein: There is occlusive thrombus within the mid to distal right cephalic vein. Vessel is noncompressible with no Doppler flow detected in this region. The proximal aspect of the cephalic vein is patent. Basilic Vein: No evidence of thrombus. Normal compressibility, respiratory phasicity and response to augmentation. Brachial Veins: No evidence of thrombus. Normal compressibility, respiratory phasicity and response to augmentation. Radial Veins: No evidence of thrombus. Normal compressibility, respiratory phasicity and response to augmentation. Ulnar Veins: No evidence of thrombus. Normal compressibility, respiratory phasicity and response to augmentation. Other Findings: Echogenic thrombus is again noted within the left internal jugular vein, unchanged since ultrasound performed yesterday. IMPRESSION: 1. Occlusive thrombus within the mid to distal right cephalic vein as above. No other deep venous thrombosis within the right upper extremity. 2. Stable thrombus within the left internal jugular vein, unchanged since ultrasound performed yesterday. Electronically Signed   By: Sharlet Salina M.D.   On: 08/25/2023 20:15   US Venous Img  Lower Bilateral (DVT)  Result Date: 08/25/2023 CLINICAL DATA:  Edema EXAM: BILATERAL LOWER EXTREMITY VENOUS DOPPLER ULTRASOUND TECHNIQUE: Gray-scale sonography with graded compression, as well as color Doppler and duplex ultrasound were performed to evaluate the lower extremity deep venous systems from the level of the common femoral vein and including the common femoral, femoral, profunda femoral, popliteal and calf veins including the posterior tibial, peroneal and gastrocnemius veins when visible. The superficial great saphenous vein was also interrogated. Spectral Doppler was utilized to evaluate flow at rest and with distal augmentation maneuvers in the common femoral, femoral and popliteal veins. COMPARISON:  None Available. FINDINGS: RIGHT LOWER EXTREMITY Common Femoral Vein: No evidence of thrombus. Normal compressibility, respiratory phasicity and response to augmentation. Saphenofemoral Junction: No evidence of thrombus. Normal compressibility and flow on color Doppler imaging. Profunda Femoral Vein: No evidence of thrombus. Normal compressibility and flow on color Doppler imaging. Femoral Vein: No evidence of thrombus. Normal compressibility, respiratory phasicity and response to augmentation. Popliteal Vein: No evidence of thrombus. Normal compressibility, respiratory phasicity and response to augmentation. Calf Veins: Occlusive echogenic thrombus within the posterior  tibial and peroneal veins. Superficial Great Saphenous Vein: No evidence of thrombus. Normal compressibility. Venous Reflux:  None. Other Findings:  None. LEFT LOWER EXTREMITY Common Femoral Vein: No evidence of thrombus. Normal compressibility, respiratory phasicity and response to augmentation. Saphenofemoral Junction: No evidence of thrombus. Normal compressibility and flow on color Doppler imaging. Profunda Femoral Vein: Near-occlusive thrombus within the left profundus femoral vein. Femoral Vein: No evidence of thrombus. Normal  compressibility, respiratory phasicity and response to augmentation. Popliteal Vein: Partially occlusive thrombus within the left popliteal vein distally. Calf Veins: Partially occlusive thrombus in the left peroneal vein. Posterior tibial vein is patent. Superficial Great Saphenous Vein: No evidence of thrombus. Normal compressibility. Venous Reflux:  None. Other Findings:  None. IMPRESSION: 1. Positive for deep venous thrombosis involving the left profundus femoral, popliteal, and peroneal veins. 2. Positive for deep venous thrombosis involving the right posterior tibial and peroneal veins. No evidence of right lower extremity DVT above the level of the knee. These results will be called to the ordering clinician or representative by the Radiologist Assistant, and communication documented in the PACS or [employer]. Electronically Signed   By: Duanne Guess D.O.   On: 08/25/2023 19:48   CT ABDOMEN PELVIS W CONTRAST  Result Date: 08/25/2023 CLINICAL DATA:  Ulcerative colitis.  Severe diarrhea. EXAM: CT ABDOMEN AND PELVIS WITH CONTRAST TECHNIQUE: Multidetector CT imaging of the abdomen and pelvis was performed using the standard protocol following bolus administration of intravenous contrast. RADIATION DOSE REDUCTION: This exam was performed according to the departmental dose-optimization program which includes automated exposure control, adjustment of the mA and/or kV according to patient size and/or use of iterative reconstruction technique. CONTRAST:  OMNIPAQUE IOHEXOL 300 MG/ML  SOLN COMPARISON:  CT 08/04/2023 abdomen pelvis.  Chest 08/24/2023 FINDINGS: Lower chest: Since the prior there is developing bronchial wall thickening along the lower lobes dependently with some developing reticulonodular opacities, left-greater-than-right. Trace left pleural effusion. Please correlate for an infectious or inflammatory process. Please correlate with the prior CT Hepatobiliary: Mild fatty liver  infiltration. Gallbladder has some dependent increased density, possibly sludge or stones. Patent portal vein. Pancreas: Unremarkable. No pancreatic ductal dilatation or surrounding inflammatory changes. Spleen: Normal in size without focal abnormality. Adrenals/Urinary Tract: Adrenal glands are preserved. No enhancing renal mass or collecting system dilatation. The ureters have normal course and caliber extending down to the urinary bladder. Significant contrast in the bladder itself. Please correlate with previous CT angiogram of the chest from 08/24/2023 Stomach/Bowel: On this non oral contrast exam, the stomach and small bowel are nondilated. Rectal tube in place. There is significant wall thickening, mucosal enhancement along the rectosigmoid colon. Lesser extent along the transverse and descending colon although these loops of colon are more featureless than usually seen consistent with the history of ulcerative colitis. Adjacent areas of inflammatory stranding identified. Presacral fluid and stranding is increased from previous exam as well. Vascular/Lymphatic: Aortic atherosclerosis. No enlarged abdominal or pelvic lymph nodes. Reproductive: Prostate is unremarkable. Other: Anasarca.  No free air. Musculoskeletal: Curvature of the spine with moderate degenerative changes of the spine and pelvis. Bridging osteophytes along the sacroiliac joints. IMPRESSION: Increasing wall thickening, mucosal enhancement with the adjacent inflammatory changes along the rectosigmoid colon greater than descending colon. There is a relatively long segment of featureless colon consistent with the history of ulcerative colitis. No obstruction, free air or free fluid . Tree-in-bud like changes along the lung bases with bronchiectasis. Please correlate with the prior CT scan Electronically Signed  By: Karen Kays M.D.   On: 08/25/2023 11:36    Family Communication: Patient understands and agrees with current  plan.  Disposition: Status is: Inpatient Remains inpatient appropriate because: hypercoagulable consition on heparin drip  Planned Discharge Destination: Skilled nursing facility   MDM level 3- Patient has multiple DVT, right segmental PE, on heparin drip is high risk for GI bleed. Patient is at high risk for clinical deterioration.  Author: [name] Duster, MD 08/26/2023 8:09 PM  For on call review [email or web address].

## 2023-08-26 NOTE — Progress Notes (Signed)
ANTICOAGULATION CONSULT NOTE  Pharmacy Consult for Heparin  Indication:  internal jugular DVT   Allergies  Allergen Reactions   Penicillins Anaphylaxis    Has patient had a PCN reaction causing immediate rash, facial/tongue/throat swelling, SOB or lightheadedness with hypotension: Yes Has patient had a PCN reaction causing severe rash involving mucus membranes or skin necrosis: No Has patient had a PCN reaction that required hospitalization No Has patient had a PCN reaction occurring within the last 10 years: No If all of the above answers are "NO", then may proceed with Cephalosporin use.   Cephalosporins Other (See Comments)    Reaction:  Unknown     Patient Measurements: Height: 5\' 10"  (177.8 cm) Weight: 72 kg (158 lb 11.2 oz) IBW/kg (Calculated) : 73 Heparin Dosing Weight: 74.4 kg   Vital Signs: Temp: 97.6 F (36.4 C) (09/17 0157) BP: 101/79 (09/17 0157) Pulse Rate: 90 (09/17 0157)  Labs: Recent Labs    08/24/23 0204 08/24/23 0204 08/24/23 0336 08/24/23 0910 08/24/23 1831 08/25/23 0023 08/25/23 0054 08/25/23 0805 08/25/23 1415 08/25/23 2148 08/26/23 0624  HGB 11.5*  --   --  11.0* 11.3*  --  11.1*  --   --   --   --   HCT 35.2*  --   --  32.7* 34.3*  --  32.6*  --   --   --   --   PLT 443*  --   --   --   --   --   --   --   --   --   --   APTT 30  --   --   --   --   --   --   --   --   --   --   LABPROT 15.5*  --   --   --   --   --   --   --   --   --   --   INR 1.2  --   --   --   --   --   --   --   --   --   --   HEPARINUNFRC  --    < >  --  0.16* 0.33   < >  --  0.45 0.39  --  0.35  CREATININE 0.63  --   --   --   --   --   --   --   --  0.58*  --   TROPONINIHS  --   --  9  --   --   --   --   --   --   --   --    < > = values in this interval not displayed.    Estimated Creatinine Clearance: 98.8 mL/min (A) (by C-G formula based on SCr of 0.58 mg/dL (L)).   Medical History: Past Medical History:  Diagnosis Date   Dysphagia    Hyperlipidemia     Hypertension    Stroke (HCC)    Ulcerative colitis [facility] [place])     Assessment: Pharmacy consulted to dose heparin in this 62 year old male admitted with internal jugular DVT.  No prior anticoag noted. Baseline labs: INR 1.2, aPTT 30s  0915 1831 HL 0.33 Therapeutic x1 0916 0023 HL 0.29 SUBtherapeutic  0916 0805 HL 0.45 Therapeutic x 1 0916 1415 HL 0.39 Therapeutic x 2 0917 0624 HL 0.35 Therapeutic  No issues with infusion reported, no signs/symptoms of bleeding noted.  Goal of Therapy:  Heparin level 0.3-0.7 units/ml Monitor platelets by anticoagulation protocol: Yes   PLAN: Continue heparin infusion at 1750 units/hour. Check HL next with AM labs tomorrow Continue to monitor CBC daily while on heparin infusion.   Barrie Folk, PharmD Clinical Pharmacist 08/26/2023 7:54 AM

## 2023-08-26 NOTE — Consult Note (Addendum)
WOC Nurse Consult Note: Reason for Consult: multiple wounds  Wound type: 1. Full thickness R elbow, B knees, R cheek 2.  Unstageable Pressure Injury sacrum  3.  Deep Tissue Pressure Injury L buttock and R hip   Pressure Injury POA: Yes Measurement: 1.  Full thickness R elbow 3 cm x 2 cm 75% yellow 25% pink moist  2.  L knee multiple dry abrasions largest 3 cm x 2 cm 100% dry eschar; R knee multiple dry abrasions largest 4 cm x 4 cm 75% dry eschar 25% pink moist  3.  Full thickness R cheek 2 cm x 3 cm 80% dry eschar 20% pink moist  4. Sacrum Unstageable Pressure Injury 3 cm x 2 cm x 1 cm with 3 cm tunneling at 6 o'clock 80% black gray necrotic tissue 20% pink moist  5.  L buttock Deep Tissue Pressure Injury 5 cm x 5 cm intact purple maroon discoloration; R hip 2 cm x 2 cm DTPI    Wound bed: as above Drainage (amount, consistency, odor) minimal tan exudate from sacrum and R elbow Periwound: erythema to buttocks; flexiseal in place  Dressing procedure/placement/frequency:  Clean R elbow and Sacral wounds with NS, apply Santyl to wound beds daily, Apply 1/4" thick layer of Santyl to wound bed, top R elbow wound with saline moist gauze, dry gauze and silicone foam. Apply Santyl to sacral wound then fill  wound with NS moistened gauze; using a Q tip applicator making sure to cover tunneling at 6 o'clock (towards rectum). May top with silicone foam or ABD pad whichever is preferred.    Paint B knee wounds with Betadine 2 times a day and allow to air dry.   L buttock and R hip DTPI apply a layer of Xeroform to area daily and cover with silicone foam.   Full thickness R cheek apply Mupirocin 2 times a day and leave open to air.   Patient would benefit from a low air loss mattress for pressure redistribution and moisture management.    POC discussed with patient and bedside nurse. WOC team will not follow at this time. Re-consult if further needs arise.   Thank you,    Priscella Mann MSN, RN-BC,  [employer] [phone]

## 2023-08-26 NOTE — Plan of Care (Signed)

## 2023-08-26 NOTE — Progress Notes (Signed)
Initial Nutrition Assessment  DOCUMENTATION CODES:   Severe malnutrition in context of chronic illness  INTERVENTION:   Boost Breeze po TID, each supplement provides 250 kcal and 9 grams of protein  Prostat liquid protein PO 30 ml BID with meals, each supplement provides 100 kcal, 15 grams protein.  Magic cup TID with meals, each supplement provides 290 kcal and 9 grams of protein  MVI po daily   Vitamin C 500mg  po BID  Zinc 220mg  po daily x 30 days  Pt at high refeed risk; recommend monitor potassium, magnesium and phosphorus labs daily until stable  Daily weights   Assist with meals  Check vitamins A, D, E, C, zinc, copper, ferritin, folate and B12.   NUTRITION DIAGNOSIS:   Severe Malnutrition related to chronic illness (UC) as evidenced by severe muscle depletion, severe fat depletion.  GOAL:   Patient will meet greater than or equal to 90% of their needs  MONITOR:   PO intake, Supplement acceptance, Labs, Weight trends, Skin, I & O's  REASON FOR ASSESSMENT:   Malnutrition Screening Tool    ASSESSMENT:   62 y/o male with h/o UC (biopsy diagnosed 2002), HTN, stroke with chronic L handed weakness, etoh abuse and HLD who is admitted with DVT.  Met with pt in room today. Pt reports good appetite and oral intake pta and in hospital. 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 Pt also with anasarca noted in his bilateral arms and feet. Pt reports eating 100% of his lunch today. RD discussed with pt the importance of adequate nutrition needed to preserve lean muscle. RD also discussed pt's risk of malabsorption secondary to his UC.   Pt reports that he does not like Boost or Ensure but he is willing to try other protein supplements (loves chocolate ice cream). Pt reports that he takes vitamin D3 daily at home but he does not take any other vitamins. Pt with numerous wounds  noted in chart. RD will add supplements and vitamins to help support wound healing. Will check vitamins labs important for wound healing to r/o deficiency as pt at increased risk for deficiency secondary to UC. Per chart, pt appears weight stable at baseline.   Medications reviewed and include: solu-medrol, D3, heparin   Labs reviewed: Na 132(L), K 3.7 wnl, creat 0.54(L), P 3.8 wnl, Mg 1.9 wnl  NUTRITION - FOCUSED PHYSICAL EXAM:  Flowsheet Row Most Recent Value  Orbital Region Severe depletion  Upper Arm Region Moderate depletion  Thoracic and Lumbar Region Severe depletion  Buccal Region Severe depletion  Temple Region Severe depletion  Clavicle Bone Region Severe depletion  Clavicle and Acromion Bone Region Severe depletion  Scapular Bone Region Moderate depletion  Dorsal Hand Moderate depletion  Patellar Region Moderate depletion  Anterior Thigh Region Moderate depletion  Posterior Calf Region Moderate depletion  Edema (RD Assessment) Moderate  Hair Reviewed  Eyes Reviewed  Mouth Reviewed  Skin Reviewed  Nails Reviewed   Diet Order:   Diet Order             DIET DYS 3 Room service appropriate? Yes with Assist; Fluid consistency: Thin  Diet effective now                  EDUCATION NEEDS:   Education needs have been addressed  Skin:  Skin Assessment: Reviewed RN Assessment  - Full thickness R elbow  3 cm x 2 cm 75% yellow 25% pink moist  - L knee multiple dry abrasions largest 3 cm x 2 cm 100% dry eschar;  - R knee multiple dry abrasions largest 4 cm x 4 cm 75% dry eschar 25% pink moist  - Full thickness R cheek 2 cm x 3 cm 80% dry eschar 20% pink moist  - Sacrum Unstageable Pressure Injury 3 cm x 2 cm x 1 cm with 3 cm tunneling at 6 o'clock 80% black gray necrotic tissue 20% pink moist  - L buttock Deep Tissue Pressure Injury 5 cm x 5 cm intact purple maroon discoloration - R hip 2 cm x 2 cm DTPI   Last BM:  9/17- type 6- via rectal tube  Height:   Ht  Readings from Last 1 Encounters:  08/24/23 5\' 10"  (1.778 m)    Weight:   Wt Readings from Last 1 Encounters:  08/26/23 75.2 kg    Ideal Body Weight:  75.4 kg  BMI:  Body mass index is 23.79 kg/m.  Estimated Nutritional Needs:   Kcal:  2100-2400kcal/day  Protein:  105-120g/day  Fluid:  2.2-2.5L/day  Betsey Holiday MS, RD, LDN Please refer to [facility] for RD and/or RD on-call/weekend/after hours pager

## 2023-08-26 NOTE — Evaluation (Signed)
Occupational Therapy Evaluation Patient Details Name: [name] MRN: [id number] DOB: 1961/07/24 Today's Date: 08/26/2023   History of Present Illness Patient is a 62 year old with internal jugular DVT, recent admission for ulcerative colitis with GI bleeding and sepsis secondary to UTI. CTA with acute pulmonary embolism within the right lower lobe segment as well as multifocal lower bibasilar tree-in-bud opacities concerning for aspiration versus infection. History of stroke, ulcerative colitis, GIB, HTN, HLD, alcohol use, marijuana abuse, who presents with altered mental status, fall and GI bleeding.   Clinical Impression   [name] was seen for OT/PT co-evaluation this date. Pt is poor historian, reports living alone using RW for mobility and 1 recent fall. Pt currently requires MOD A bed mobility. MAX A don B socks seated EOB. MOD A x2 sit<>stand - pt limited by significant fear of falling. Pt would benefit from skilled OT to address noted impairments and functional limitations (see below for any additional details). Upon hospital discharge, recommend OT follow up.    If plan is discharge home, recommend the following: Help with stairs or ramp for entrance;A lot of help with walking and/or transfers;A lot of help with bathing/dressing/bathroom;Direct supervision/assist for medications management;Direct supervision/assist for financial management    Functional Status Assessment  Patient has had a recent decline in their functional status and demonstrates the ability to make significant improvements in function in a reasonable and predictable amount of time.  Equipment Recommendations  Other (comment) (defer)    Recommendations for Other Services       Precautions / Restrictions Precautions Precautions: Fall Restrictions Weight Bearing Restrictions: No      Mobility Bed Mobility Overal bed mobility: Needs Assistance Bed Mobility: Supine to Sit, Sit to Supine Rolling: Mod  assist, Used rails   Supine to sit: Mod assist Sit to supine: Mod assist, +2 for physical assistance   General bed mobility comments: repeatedly states "I'm going to fall" despite having assistance from therapist    Transfers Overall transfer level: Needs assistance Equipment used: 2 person hand held assist Transfers: Sit to/from Stand Sit to Stand: Mod assist, +2 physical assistance, From elevated surface                  Balance Overall balance assessment: Needs assistance Sitting-balance support: Feet supported Sitting balance-Leahy Scale: Fair Sitting balance - Comments: no gross loss of balance while sitting   Standing balance support: Bilateral upper extremity supported Standing balance-Leahy Scale: Zero Standing balance comment: + 2 person assistance required to maintain upright standing posture                           ADL either performed or assessed with clinical judgement   ADL Overall ADL's : Needs assistance/impaired                                       General ADL Comments: MAX A don B socks seated EOB. MOD A x2 simulated BSC t/f      Pertinent Vitals/Pain Pain Assessment Pain Assessment: No/denies pain     Extremity/Trunk Assessment Upper Extremity Assessment Upper Extremity Assessment: Generalized weakness LUE Deficits / Details: patient reports thumb weakness from prior stroke   Lower Extremity Assessment Lower Extremity Assessment: Generalized weakness       Communication Communication Communication: Difficulty communicating thoughts/reduced clarity of speech Cueing Techniques: Tactile cues;Verbal cues  Cognition Arousal: Alert Behavior During Therapy: Anxious Overall Cognitive Status: No family/caregiver present to determine baseline cognitive functioning Area of Impairment: Safety/judgement, Problem solving, Memory                     Memory: Decreased short-term memory   Safety/Judgement:  Decreased awareness of safety, Decreased awareness of deficits   Problem Solving: Slow processing, Difficulty sequencing, Decreased initiation, Requires verbal cues General Comments: Patient needs occasional encouragement and redirection. Fearful of falling     General Comments  Sp02 98-100% on room air during session. heart rate 91bpm at rest, increasing to 106bpm with activity. no significant dyspnea noted with exertion            Home Living Family/patient expects to be discharged to:: Unsure Living Arrangements: Alone Available Help at Discharge: Family;Friend(s);Available PRN/intermittently Type of Home: House Home Access: Stairs to enter [employer] of Steps: 2 Entrance Stairs-Rails: Left;Right Home Layout: One level     Bathroom Shower/Tub: Chief Strategy Officer: Standard Bathroom Accessibility: Yes   Home Equipment: Cane - quad;Standard Environmental consultant   Additional Comments: home setup from recent admission      Prior Functioning/Environment Prior Level of Function : Independent/Modified Independent             Mobility Comments: patient reports at least one fall. he states he has a walker at home but has difficulty describing the style of walker (previous notes indicate walker is standard). he uses the walker intermittently ADLs Comments: patient reports he is independent with ADLs        OT Problem List: Decreased strength;Decreased range of motion;Decreased activity tolerance;Impaired balance (sitting and/or standing);Decreased cognition      OT Treatment/Interventions: Self-care/ADL training;Therapeutic exercise;Energy conservation;DME and/or AE instruction;Therapeutic activities;Cognitive remediation/compensation;Patient/family education;Balance training    OT Goals(Current goals can be found in the care plan section) Acute Rehab OT Goals Patient Stated Goal: to go home OT Goal Formulation: With patient Time For Goal Achievement:  09/09/23 Potential to Achieve Goals: Fair ADL Goals Pt Will Perform Grooming: with modified independence;sitting Pt Will Perform Lower Body Dressing: with min assist;sit to/from stand Pt Will Transfer to Toilet: ambulating;bedside commode;with min assist  OT Frequency: Min 1X/week    Co-evaluation PT/OT/SLP Co-Evaluation/Treatment: Yes Reason for Co-Treatment: To address functional/ADL transfers PT goals addressed during session: Mobility/safety with mobility OT goals addressed during session: ADL's and self-care      AM-PAC OT "6 Clicks" Daily Activity     Outcome Measure Help from another person eating meals?: A Little Help from another person taking care of personal grooming?: A Little Help from another person toileting, which includes using toliet, bedpan, or urinal?: A Lot Help from another person bathing (including washing, rinsing, drying)?: A Lot Help from another person to put on and taking off regular upper body clothing?: A Little Help from another person to put on and taking off regular lower body clothing?: A Lot 6 Click Score: 15   End of Session Nurse Communication: Mobility status  Activity Tolerance: Patient tolerated treatment well Patient left: in bed;with call bell/phone within reach;with bed alarm set  OT Visit Diagnosis: History of falling (Z91.81);Muscle weakness (generalized) (M62.81);Other symptoms and signs involving the nervous system (Z61.096)                Time: 0454-0981 OT Time Calculation (min): 10 min Charges:  OT General Charges $OT Visit: 1 Visit OT Evaluation $OT Eval Low Complexity: 1 Low  Kathie Dike, M.S. OTR/L  08/26/23, 10:47 AM  ascom [phone]

## 2023-08-26 NOTE — Evaluation (Signed)
Physical Therapy Evaluation Patient Details Name: TAMAS ZAVALETA MRN: [id number] DOB: 09/11/1961 Today's Date: 08/26/2023  History of Present Illness  Patient is a 62 year old with internal jugular DVT, recent admission for ulcerative colitis with GI bleeding and sepsis secondary to UTI. CTA with acute pulmonary embolism within the right lower lobe segment as well as multifocal lower bibasilar tree-in-bud opacities concerning for aspiration versus infection. History of stroke, ulcerative colitis, GIB, HTN, HLD, alcohol use, marijuana abuse, who presents with altered mental status, fall and GI bleeding.   Clinical Impression  Patient is agreeable to PT evaluation with encouragement. Patient reports he is independent with mobility at baseline and lives alone. The patient is anxious at times and needs increased time with following commands.  Today, the patient has generalized weakness. He requires physical assistance with bed mobility. He was able to stand with +2 person assistance. Assistance required for standing balance, standing tolerance of less than 30 seconds, difficulty weight shifting, and unable to take steps at this time. No significant dyspnea is noted with exertion. Sp02 98-100% on room air throughout session with heart rate 91-106bpm.  The patient does not appear to be at his baseline level of independence. Recommend PT follow up to maximize independence and decrease caregiver burden. Consider rehabilitation after this hospital stay <3 hours/day.       If plan is discharge home, recommend the following: A lot of help with walking and/or transfers;A lot of help with bathing/dressing/bathroom;Help with stairs or ramp for entrance;Assistance with cooking/housework   Can travel by private vehicle   No    Equipment Recommendations  (to be determined at next venue of care)  Recommendations for Other Services       Functional Status Assessment Patient has had a recent decline in their  functional status and demonstrates the ability to make significant improvements in function in a reasonable and predictable amount of time.     Precautions / Restrictions Precautions Precautions: Fall Restrictions Weight Bearing Restrictions: No      Mobility  Bed Mobility Overal bed mobility: Needs Assistance Bed Mobility: Supine to Sit, Sit to Supine Rolling: Mod assist, Used rails   Supine to sit: Mod assist, HOB elevated, Used rails Sit to supine: Mod assist, +2 for physical assistance   General bed mobility comments: verbal cues for task initiation and sequencing. increased time and increased anxiety with movement. patient reports multiple times "I'm going to fall" despite having physical assistance from therapist    Transfers Overall transfer level: Needs assistance Equipment used: 2 person hand held assist Transfers: Sit to/from Stand Sit to Stand: Mod assist, +2 physical assistance, From elevated surface           General transfer comment: patient was able to stand once with cues for task initiation. increased time and effort requried with standing attempts    Ambulation/Gait         Gait velocity: standing tolerance limited to less than 30 seconds. emphasis on upright standing posture, weight shifting for side steps and patient unable to complete due to weakness and fear of falling. unable to take steps this date        Stairs            Wheelchair Mobility     Tilt Bed    Modified Rankin (Stroke Patients Only)       Balance Overall balance assessment: Needs assistance Sitting-balance support: Feet supported Sitting balance-Leahy Scale: Fair Sitting balance - Comments: no gross loss of  balance while sitting   Standing balance support: Bilateral upper extremity supported Standing balance-Leahy Scale: Zero Standing balance comment: + 2 person assistance required to maintain upright standing posture                              Pertinent Vitals/Pain Pain Assessment Pain Assessment: No/denies pain    Home Living Family/patient expects to be discharged to:: Skilled nursing facility Living Arrangements: Alone Available Help at Discharge: Family;Friend(s);Available PRN/intermittently Type of Home: House Home Access: Stairs to enter Entrance Stairs-Rails: [employer] of Steps: 2   Home Layout: One level Home Equipment: Cane - quad;Standard Environmental consultant      Prior Function Prior Level of Function : Independent/Modified Independent             Mobility Comments: patient reports at least one fall. he states he has a walker at home but has difficulty describing the style of walker (previous notes indicate walker is standard). he uses the walker intermittently ADLs Comments: patient reports he is independent with ADLs     Extremity/Trunk Assessment   Upper Extremity Assessment Upper Extremity Assessment: Generalized weakness;Overall WFL for tasks assessed;LUE deficits/detail (edema noted throughout) LUE Deficits / Details: patient reports thumb numbness from prior stroke    Lower Extremity Assessment Lower Extremity Assessment: Generalized weakness (edema noted throughout)       Communication   Communication Communication: Difficulty communicating thoughts/reduced clarity of speech Cueing Techniques: Tactile cues;Verbal cues  Cognition Arousal: Alert Behavior During Therapy: Anxious, Impulsive Overall Cognitive Status: No family/caregiver present to determine baseline cognitive functioning Area of Impairment: Safety/judgement, Problem solving, Memory                     Memory: Decreased short-term memory   Safety/Judgement: Decreased awareness of safety, Decreased awareness of deficits   Problem Solving: Slow processing, Difficulty sequencing, Decreased initiation, Requires verbal cues General Comments: Patient needs occasional encouragement and redirection. He  perseverates at times and needs cues for attention to task        General Comments General comments (skin integrity, edema, etc.): Sp02 98-100% on room air during session. heart rate 91bpm at rest, increasing to 106bpm with activity. no significant dyspnea noted with exertion    Exercises     Assessment/Plan    PT Assessment Patient needs continued PT services  PT Problem List Decreased mobility;Decreased balance;Decreased activity tolerance;Decreased strength;Decreased safety awareness       PT Treatment Interventions DME instruction;Gait training;Stair training;Functional mobility training;Therapeutic activities;Therapeutic exercise;Balance training;Neuromuscular re-education;Cognitive remediation;Patient/family education    PT Goals (Current goals can be found in the Care Plan section)  Acute Rehab PT Goals Patient Stated Goal: to feel better PT Goal Formulation: With patient Time For Goal Achievement: 09/09/23 Potential to Achieve Goals: Fair    Frequency Min 1X/week     Co-evaluation PT/OT/SLP Co-Evaluation/Treatment: Yes Reason for Co-Treatment: To address functional/ADL transfers PT goals addressed during session: Mobility/safety with mobility         AM-PAC PT "6 Clicks" Mobility  Outcome Measure Help needed turning from your back to your side while in a flat bed without using bedrails?: A Lot Help needed moving from lying on your back to sitting on the side of a flat bed without using bedrails?: A Lot Help needed moving to and from a bed to a chair (including a wheelchair)?: Total Help needed standing up from a chair using your arms (e.g., wheelchair or bedside  chair)?: Total Help needed to walk in hospital room?: Total Help needed climbing 3-5 steps with a railing? : Total 6 Click Score: 8    End of Session   Activity Tolerance: Patient tolerated treatment well;Patient limited by fatigue Patient left: in bed;with call bell/phone within reach;with bed alarm  set Nurse Communication: Mobility status PT Visit Diagnosis: Unsteadiness on feet (R26.81);Muscle weakness (generalized) (M62.81)    Time: 4782-9562 PT Time Calculation (min) (ACUTE ONLY): 11 min   Charges:   PT Evaluation $PT Eval Low Complexity: 1 Low   PT General Charges $$ ACUTE PT VISIT: 1 Visit         Donna Bernard, PT, MPT   Ina Homes 08/26/2023, 9:14 AM

## 2023-08-27 DIAGNOSIS — I82623 Acute embolism and thrombosis of deep veins of upper extremity, bilateral: Secondary | ICD-10-CM

## 2023-08-27 DIAGNOSIS — E782 Mixed hyperlipidemia: Secondary | ICD-10-CM

## 2023-08-27 DIAGNOSIS — K51918 Ulcerative colitis, unspecified with other complication: Secondary | ICD-10-CM | POA: Diagnosis not present

## 2023-08-27 DIAGNOSIS — I10 Essential (primary) hypertension: Secondary | ICD-10-CM | POA: Diagnosis not present

## 2023-08-27 DIAGNOSIS — I2693 Single subsegmental pulmonary embolism without acute cor pulmonale: Secondary | ICD-10-CM | POA: Diagnosis not present

## 2023-08-27 DIAGNOSIS — Z789 Other specified health status: Secondary | ICD-10-CM | POA: Diagnosis not present

## 2023-08-27 DIAGNOSIS — L8990 Pressure ulcer of unspecified site, unspecified stage: Secondary | ICD-10-CM

## 2023-08-27 MED ORDER — APIXABAN 5 MG PO TABS: 5.0000 mg | ORAL_TABLET | Freq: Two times a day (BID) | ORAL | Status: DC

## 2023-08-27 MED ORDER — APIXABAN 5 MG PO TABS
10.0000 mg | ORAL_TABLET | Freq: Two times a day (BID) | ORAL | Status: DC
  Administered 2023-08-27 – 2023-08-28 (×3): 10 mg via ORAL
  Filled 2023-08-27 (×3): qty 2

## 2023-08-27 MED ORDER — LEVOFLOXACIN 750 MG PO TABS
750.0000 mg | ORAL_TABLET | Freq: Every day | ORAL | Status: AC
  Administered 2023-08-28: 750 mg via ORAL
  Filled 2023-08-27: qty 1

## 2023-08-27 NOTE — Plan of Care (Signed)

## 2023-08-27 NOTE — TOC Progression Note (Addendum)
Transition of Care [facility]) - Progression Note    Patient Details  Name: [name] MRN: [id number] Date of Birth: 01-09-1961  [facility] CM/SW Contact  Chapman Fitch, RN Phone Number: 08/27/2023, 9:26 AM  Clinical Narrative:     Message sent to Kenney Houseman at [facility] to confirm patient is STR  Messages sent to MD to determine when it is anticipated patient will medically be ready for discharge in order for insurance auth to be obtained    Update 927 am.  Kenney Houseman confirms patient is STR Patient medically stable  Update 931 -  Holly with TOC to start auth.  Kenney Houseman at [facility] notified   Expected Discharge Plan: Skilled Nursing Facility Barriers to Discharge: Continued Medical Work up  Expected Discharge Plan and Services       Living arrangements for the past 2 months: Single Family Home                                       Social Determinants of Health (SDOH) Interventions SDOH Screenings   Food Insecurity: No Food Insecurity (08/25/2023)  Housing: Low Risk  (08/25/2023)  Transportation Needs: No Transportation Needs (08/25/2023)  Recent Concern: Transportation Needs - Unmet Transportation Needs (08/04/2023)  Utilities: Not At Risk (08/25/2023)  Tobacco Use: Low Risk  (08/25/2023)    Readmission Risk Interventions     No data to display

## 2023-08-27 NOTE — Progress Notes (Signed)
Progress Note   Patient: [name] BJY:[id number] DOB: 10/14/1961 DOA: 08/24/2023     3 DOS: the patient was seen and examined on 08/27/2023   Brief hospital course: KASEM HAFELE is a 62 y.o. male with medical history significant of ulcerative colitis, CVA, hypertension, hyperlipidemia presented with internal jugular DVT.  Patient noted to have been recently admitted at the end of August for ulcerative colitis associated GI bleeding as well as sepsis secondary to UTI.  Patient reports roughly 1 to 2 weeks of worsening lower extremity swelling and generalized malaise.  CTA with acute pulmonary embolism within the right lower lobe segment as well as multifocal lower bibasilar tree-in-bud opacities concerning for aspiration versus infection. Ultrasound with noted thrombus noted in the left internal jugular vein. Positive occlusion. Patient is started on heparin drip per protocol and admitted for further management.  Assessment and Plan: Acute pulmonary embolism * DVT (deep venous thrombosis) (HCC) + occlusive internal jugular DVT on imaging  +occlusive thrombus right cephalic vein. + DVT left profundus, femoral, popliteal, and peroneal veins +DVT right posterior tibial and peroneal veins. No vascular intervention per vascular team. Heparin gtt transitioned to Eliquis per pharmacy protocol. Monitor h/h daily given h/o GI bleed, UL flare up. He will need hematology evaluation for hypercoagulable work up outpatient.  Ulcerative colitis, chronic (HCC) Noted admission 8/26--->8/31 for UC assd GI bleeding  No active GI bleeding, Hgb stable at 10. Cont balsalazide. C. Diff negative. GI evaluation appreciated, started on IV steroids. Outpatient GI follow up suggested.  Hyponatremia Improved with fluids. Na 131. Stable. Trend electrolytes.  HTN (hypertension) BP stable  Continue home regimen   HLD (hyperlipidemia) Cont statin, he has prior h/o CVA.  Alcohol use Prior heavy  alcohol use  Pt states etoh abstinence of several months Thiamine, folate, multivitamin.  Pneumonia CTA and ultrasound with noted internal jugular DVT also with imaging concerning for new multi lobar pneumonia IV levoquin for total 5 days.  Multiple pressure ulcers. Pressure injury Sacrum Mid Unstageable, present on admission  Pressure injury Bilateral Deep Tissue injury to Buttocks, present on admission  Continue wound care.  Fall, aspiration precautions. Supportive care.   Code Status: Full Code      Subjective: Patient is seen and examined today morning. He denies any complaints. Eating poor. Did not get out of bed. Says that his significant other will be at bedside.  Physical Exam: Vitals:   08/26/23 2050 08/27/23 0311 08/27/23 0500 08/27/23 0855  BP: 108/77 113/86  101/73  Pulse: 99 87  98  Resp: 18 18  18   Temp: 98.3 F (36.8 C) 97.8 F (36.6 C)  98 F (36.7 C)  TempSrc: Oral Oral    SpO2: 98% 99%  100%  Weight:   75 kg   Height:       General - Elderly Caucasian ill male, no apparent distress HEENT - PERRLA, EOMI, pale conjunctiva, atraumatic head, non tender sinuses. Lung - Clear, diffuse rhonchi Heart - S1, S2 heard, no murmurs, rubs, 1+ pedal edema Neuro - Alert, awake and oriented x 3, non focal exam. Skin - Warm and dry. All extremities edematous Data Reviewed:     Latest Ref Rng & Units 08/27/2023    7:41 AM 08/26/2023    6:24 AM 08/25/2023   12:54 AM  CBC  WBC 4.0 - 10.5 K/uL 8.8  6.3    Hemoglobin 13.0 - 17.0 g/dL 08.6  57.8  46.9   Hematocrit 39.0 - 52.0 % 29.8  33.6  32.6   Platelets 150 - 400 K/uL 315  358         Latest Ref Rng & Units 08/27/2023    7:41 AM 08/26/2023    6:24 AM 08/25/2023    9:48 PM  BMP  Glucose 70 - 99 mg/dL 161  096  045   BUN 8 - 23 mg/dL 12  8  7    Creatinine 0.61 - 1.24 mg/dL 4.09  8.11  9.14   Sodium 135 - 145 mmol/L 131  132  128   Potassium 3.5 - 5.1 mmol/L 3.8  3.7  3.8   Chloride 98 - 111 mmol/L 101  101  97    CO2 22 - 32 mmol/L 22  23  21    Calcium 8.9 - 10.3 mg/dL 8.0  8.3  8.0    US Venous Img Upper Uni Right(DVT)  Result Date: 08/25/2023 CLINICAL DATA:  Edema for 2 weeks, bilateral lower extremity DVT and left internal jugular vein thrombus EXAM: RIGHT UPPER EXTREMITY VENOUS DOPPLER ULTRASOUND TECHNIQUE: Gray-scale sonography with graded compression, as well as color Doppler and duplex ultrasound were performed to evaluate the upper extremity deep venous system from the level of the subclavian vein and including the jugular, axillary, basilic, radial, ulnar and upper cephalic vein. Spectral Doppler was utilized to evaluate flow at rest and with distal augmentation maneuvers. COMPARISON:  08/25/2023, 08/24/2023 FINDINGS: Contralateral Subclavian Vein: Respiratory phasicity is normal and symmetric with the symptomatic side. No evidence of thrombus. Normal compressibility. Internal Jugular Vein: No evidence of thrombus. Normal compressibility, respiratory phasicity and response to augmentation. Subclavian Vein: No evidence of thrombus. Normal compressibility, respiratory phasicity and response to augmentation. Axillary Vein: No evidence of thrombus. Normal compressibility, respiratory phasicity and response to augmentation. Cephalic Vein: There is occlusive thrombus within the mid to distal right cephalic vein. Vessel is noncompressible with no Doppler flow detected in this region. The proximal aspect of the cephalic vein is patent. Basilic Vein: No evidence of thrombus. Normal compressibility, respiratory phasicity and response to augmentation. Brachial Veins: No evidence of thrombus. Normal compressibility, respiratory phasicity and response to augmentation. Radial Veins: No evidence of thrombus. Normal compressibility, respiratory phasicity and response to augmentation. Ulnar Veins: No evidence of thrombus. Normal compressibility, respiratory phasicity and response to augmentation. Other Findings: Echogenic  thrombus is again noted within the left internal jugular vein, unchanged since ultrasound performed yesterday. IMPRESSION: 1. Occlusive thrombus within the mid to distal right cephalic vein as above. No other deep venous thrombosis within the right upper extremity. 2. Stable thrombus within the left internal jugular vein, unchanged since ultrasound performed yesterday. Electronically Signed   By: Sharlet Salina M.D.   On: 08/25/2023 20:15   US Venous Img Lower Bilateral (DVT)  Result Date: 08/25/2023 CLINICAL DATA:  Edema EXAM: BILATERAL LOWER EXTREMITY VENOUS DOPPLER ULTRASOUND TECHNIQUE: Gray-scale sonography with graded compression, as well as color Doppler and duplex ultrasound were performed to evaluate the lower extremity deep venous systems from the level of the common femoral vein and including the common femoral, femoral, profunda femoral, popliteal and calf veins including the posterior tibial, peroneal and gastrocnemius veins when visible. The superficial great saphenous vein was also interrogated. Spectral Doppler was utilized to evaluate flow at rest and with distal augmentation maneuvers in the common femoral, femoral and popliteal veins. COMPARISON:  None Available. FINDINGS: RIGHT LOWER EXTREMITY Common Femoral Vein: No evidence of thrombus. Normal compressibility, respiratory phasicity and response to augmentation. Saphenofemoral Junction: No evidence of thrombus.  Normal compressibility and flow on color Doppler imaging. Profunda Femoral Vein: No evidence of thrombus. Normal compressibility and flow on color Doppler imaging. Femoral Vein: No evidence of thrombus. Normal compressibility, respiratory phasicity and response to augmentation. Popliteal Vein: No evidence of thrombus. Normal compressibility, respiratory phasicity and response to augmentation. Calf Veins: Occlusive echogenic thrombus within the posterior tibial and peroneal veins. Superficial Great Saphenous Vein: No evidence of  thrombus. Normal compressibility. Venous Reflux:  None. Other Findings:  None. LEFT LOWER EXTREMITY Common Femoral Vein: No evidence of thrombus. Normal compressibility, respiratory phasicity and response to augmentation. Saphenofemoral Junction: No evidence of thrombus. Normal compressibility and flow on color Doppler imaging. Profunda Femoral Vein: Near-occlusive thrombus within the left profundus femoral vein. Femoral Vein: No evidence of thrombus. Normal compressibility, respiratory phasicity and response to augmentation. Popliteal Vein: Partially occlusive thrombus within the left popliteal vein distally. Calf Veins: Partially occlusive thrombus in the left peroneal vein. Posterior tibial vein is patent. Superficial Great Saphenous Vein: No evidence of thrombus. Normal compressibility. Venous Reflux:  None. Other Findings:  None. IMPRESSION: 1. Positive for deep venous thrombosis involving the left profundus femoral, popliteal, and peroneal veins. 2. Positive for deep venous thrombosis involving the right posterior tibial and peroneal veins. No evidence of right lower extremity DVT above the level of the knee. These results will be called to the ordering clinician or representative by the Radiologist Assistant, and communication documented in the PACS or [employer]. Electronically Signed   By: Duanne Guess D.O.   On: 08/25/2023 19:48    Family Communication: Patient want me to tal to his girl friend in the room, advised to call me once she is here. He understands and agrees with current plan.  Disposition: Status is: Inpatient Remains inpatient appropriate because: hypercoagulable condition on DOAC. Safe dc plan  Planned Discharge Destination: Skilled nursing facility   Time spent 43 min.  Author: [name] Duster, MD 08/27/2023 2:48 PM  For on call review [email or web address].

## 2023-08-27 NOTE — Care Management Important Message (Signed)
Important Message  Patient Details  Name: [name] MRN: [id number] Date of Birth: September 14, 1961   Medicare Important Message Given:  Yes     Johnell Comings 08/27/2023, 9:34 AM

## 2023-08-27 NOTE — Progress Notes (Signed)
ANTICOAGULATION CONSULT NOTE  Pharmacy Consult for Heparin  Indication:  internal jugular DVT   Allergies  Allergen Reactions   Penicillins Anaphylaxis    Has patient had a PCN reaction causing immediate rash, facial/tongue/throat swelling, SOB or lightheadedness with hypotension: Yes Has patient had a PCN reaction causing severe rash involving mucus membranes or skin necrosis: No Has patient had a PCN reaction that required hospitalization No Has patient had a PCN reaction occurring within the last 10 years: No If all of the above answers are "NO", then may proceed with Cephalosporin use.   Cephalosporins Other (See Comments)    Reaction:  Unknown     Patient Measurements: Height: 5\' 10"  (177.8 cm) Weight: 75 kg (165 lb 5.5 oz) IBW/kg (Calculated) : 73 Heparin Dosing Weight: 74.4 kg   Vital Signs: Temp: 97.8 F (36.6 C) (09/18 0311) Temp Source: Oral (09/18 0311) BP: 113/86 (09/18 0311) Pulse Rate: 87 (09/18 0311)  Labs: Recent Labs    08/25/23 0054 08/25/23 0805 08/25/23 1415 08/25/23 2148 08/26/23 0624 08/27/23 0741  HGB 11.1*  --   --   --  11.4* 10.1*  HCT 32.6*  --   --   --  33.6* 29.8*  PLT  --   --   --   --  358 315  HEPARINUNFRC  --    < > 0.39  --  0.35 0.39  CREATININE  --   --   --  0.58* 0.54*  --    < > = values in this interval not displayed.    Estimated Creatinine Clearance: 100.1 mL/min (A) (by C-G formula based on SCr of 0.54 mg/dL (L)).   Medical History: Past Medical History:  Diagnosis Date   Dysphagia    Hyperlipidemia    Hypertension    Stroke (HCC)    Ulcerative colitis [facility])     Assessment: Pharmacy consulted to dose heparin in this 62 year old male admitted with internal jugular DVT.  No prior anticoag noted. Baseline labs: INR 1.2, aPTT 30s  0915 1831 HL 0.33 Therapeutic x1 0916 0023 HL 0.29 SUBtherapeutic  0916 0805 HL 0.45 Therapeutic x 1 0916 1415 HL 0.39 Therapeutic x 2 0917 0624 HL 0.35 Therapeutic 0918 0741 HL  0.39 Therapeutic  No issues with infusion reported, no signs/symptoms of bleeding noted.  Goal of Therapy:  Heparin level 0.3-0.7 units/ml Monitor platelets by anticoagulation protocol: Yes   PLAN: HL is therapeutic Continue heparin infusion at 1750 units/hour. Check HL next with AM labs tomorrow Continue to monitor CBC daily while on heparin infusion.   Barrie Folk, PharmD Clinical Pharmacist 08/27/2023 8:45 AM

## 2023-08-27 NOTE — Progress Notes (Signed)
ANTICOAGULATION CONSULT NOTE  Pharmacy Consult for Apixaban Indication:  internal jugular DVT   Allergies  Allergen Reactions   Penicillins Anaphylaxis    Has patient had a PCN reaction causing immediate rash, facial/tongue/throat swelling, SOB or lightheadedness with hypotension: Yes Has patient had a PCN reaction causing severe rash involving mucus membranes or skin necrosis: No Has patient had a PCN reaction that required hospitalization No Has patient had a PCN reaction occurring within the last 10 years: No If all of the above answers are "NO", then may proceed with Cephalosporin use.   Cephalosporins Other (See Comments)    Reaction:  Unknown     Patient Measurements: Height: 5\' 10"  (177.8 cm) Weight: 75 kg (165 lb 5.5 oz) IBW/kg (Calculated) : 73 Heparin Dosing Weight: 74.4 kg   Vital Signs: Temp: 98 F (36.7 C) (09/18 0855) Temp Source: Oral (09/18 0311) BP: 101/73 (09/18 0855) Pulse Rate: 98 (09/18 0855)  Labs: Recent Labs    08/25/23 0054 08/25/23 0805 08/25/23 1415 08/25/23 2148 08/26/23 0624 08/27/23 0741  HGB 11.1*  --   --   --  11.4* 10.1*  HCT 32.6*  --   --   --  33.6* 29.8*  PLT  --   --   --   --  358 315  HEPARINUNFRC  --    < > 0.39  --  0.35 0.39  CREATININE  --   --   --  0.58* 0.54* 0.58*   < > = values in this interval not displayed.    Estimated Creatinine Clearance: 100.1 mL/min (A) (by C-G formula based on SCr of 0.58 mg/dL (L)).   Medical History: Past Medical History:  Diagnosis Date   Dysphagia    Hyperlipidemia    Hypertension    Stroke (HCC)    Ulcerative colitis [facility])     Assessment: Pharmacy consulted to dose heparin in this 62 year old male admitted with internal jugular DVT.  No prior anticoag noted. Baseline labs: INR 1.2, aPTT 30s  0915 1831 HL 0.33 Therapeutic x1 0916 0023 HL 0.29 SUBtherapeutic  0916 0805 HL 0.45 Therapeutic x 1 0916 1415 HL 0.39 Therapeutic x 2 0917 0624 HL 0.35 Therapeutic 0918 0741 HL  0.39 Therapeutic  No issues with infusion reported, no signs/symptoms of bleeding noted.  Goal of Therapy:  Heparin level 0.3-0.7 units/ml Monitor platelets by anticoagulation protocol: Yes   PLAN: Stop heparin infusion Start Apixaban 10 mg po BID x 7 days, then 5 mg po BID CBC at least every 72 hours   Barrie Folk, PharmD Clinical Pharmacist 08/27/2023 9:38 AM

## 2023-08-27 NOTE — Progress Notes (Signed)
Physical Therapy Treatment Patient Details Name: [name] MRN: [id number] DOB: [birth year] Today's Date: 08/27/2023   History of Present Illness Patient is a 62 year old with internal jugular DVT, recent admission for ulcerative colitis with GI bleeding and sepsis secondary to UTI. CTA with acute pulmonary embolism within the right lower lobe segment as well as multifocal lower bibasilar tree-in-bud opacities concerning for aspiration versus infection. History of stroke, ulcerative colitis, GIB, HTN, HLD, alcohol use, marijuana abuse, who presents with altered mental status, fall and GI bleeding.    PT Comments  Pt is steadily progressing towards PT goals. Pt is received in bed, he is agreeable to PT but discloses "I'm going to fall if I stand". PT session limited due to dislodged rectal tube noted at beginning of session. Pt performs bed mobility min-mod A and transfers mod-max A. Unable to progress mobility at this time due to generalized weakness. Pt able to perform series of STS with RW mod A and frequent cuing for hand placement and proper technique to complete task. During stand pivot transfer to [facility], Pt required max A due to fear of falling and increased fatigue. Overall, Pt demonstrated slight improvements with standing tolerance and requiring less assistance although impairments persist. Pt in bed with RN and NT at bedside. Pt would benefit from cont skilled PT to address above deficits and promote optimal return to PLOF.    If plan is discharge home, recommend the following: A lot of help with walking and/or transfers;A lot of help with bathing/dressing/bathroom;Help with stairs or ramp for entrance;Assistance with cooking/housework;Assist for transportation;Supervision due to cognitive status   Can travel by private vehicle     No  Equipment Recommendations  Other (comment) (TBD at next facility)    Recommendations for Other Services       Precautions / Restrictions  Precautions Precautions: Fall Restrictions Weight Bearing Restrictions: No     Mobility  Bed Mobility Overal bed mobility: Needs Assistance Bed Mobility: Supine to Sit, Sit to Supine     Supine to sit: HOB elevated, Used rails, Mod assist Sit to supine: Min assist, Used rails   General bed mobility comments: bed>sitting EOB requiring mod A for trunk stability and BLE management, cuing for hand placement; sit>supine requiring min A for BLE management and cuing for hand placement    Transfers Overall transfer level: Needs assistance Equipment used: Rolling walker (2 wheels) Transfers: Sit to/from Stand, Bed to chair/wheelchair/BSC Sit to Stand: Mod assist, Max assist Stand pivot transfers: Max assist         General transfer comment: x3 STS requiring mod A with verbal cuing for hand placement and head-hips relationship and upright standing posture to facilitation task; bed<>BSC max A secondary to repeatly stating "I'm going to fall" although PT assisting and NT and RN at bedside    Ambulation/Gait               General Gait Details: Unable to amb during today's visit secondary to continuous BM and replacement of rectal tube   Stairs             Wheelchair Mobility     Tilt Bed    Modified Rankin (Stroke Patients Only)       Balance Overall balance assessment: Needs assistance Sitting-balance support: Feet supported Sitting balance-Leahy Scale: Good Sitting balance - Comments: Able to maintain seated EOB balance during functional activities with no notable loss of balance or BUE support Postural control: Posterior lean Standing balance support:  Bilateral upper extremity supported, During functional activity, Reliant on assistive device for balance Standing balance-Leahy Scale: Fair Standing balance comment: Unsteady and heavy reliance on AD for static standing balance; slight forward flex posture and posterior lean requiring min A for correction                             Cognition Arousal: Alert Behavior During Therapy: Anxious Overall Cognitive Status: No family/caregiver present to determine baseline cognitive functioning Area of Impairment: Safety/judgement, Problem solving, Memory                     Memory: Decreased short-term memory   Safety/Judgement: Decreased awareness of safety, Decreased awareness of deficits   Problem Solving: Slow processing, Difficulty sequencing, Decreased initiation, Requires verbal cues General Comments: Pt requires occasional encourgament and redirection for success during therapy; fearful of falling        Exercises Other Exercises Other Exercises: toileting; max-total A from NT and RN for peri-care    General Comments General comments (skin integrity, edema, etc.): SpO2 >97% on RA during session      Pertinent Vitals/Pain Pain Assessment Pain Assessment: Faces Faces Pain Scale: Hurts a little bit Pain Location: generalized Pain Descriptors / Indicators: Moaning, Guarding Pain Intervention(s): Monitored during session    Home Living                          Prior Function            PT Goals (current goals can now be found in the care plan section) Acute Rehab PT Goals Patient Stated Goal: to feel better PT Goal Formulation: With patient Time For Goal Achievement: 09/09/23 Potential to Achieve Goals: Fair Progress towards PT goals: Progressing toward goals    Frequency    Min 1X/week      PT Plan      Co-evaluation              AM-PAC PT "6 Clicks" Mobility   Outcome Measure  Help needed turning from your back to your side while in a flat bed without using bedrails?: A Lot Help needed moving from lying on your back to sitting on the side of a flat bed without using bedrails?: A Lot Help needed moving to and from a bed to a chair (including a wheelchair)?: A Lot Help needed standing up from a chair using your arms (e.g.,  wheelchair or bedside chair)?: A Lot Help needed to walk in hospital room?: Total Help needed climbing 3-5 steps with a railing? : Total 6 Click Score: 10    End of Session Equipment Utilized During Treatment: Gait belt Activity Tolerance: Patient tolerated treatment well Patient left: in bed;with call bell/phone within reach;with nursing/sitter in room Nurse Communication: Mobility status PT Visit Diagnosis: Unsteadiness on feet (R26.81);Muscle weakness (generalized) (M62.81)     Time: 4098-1191 PT Time Calculation (min) (ACUTE ONLY): 36 min  Charges:                            Elmon Else, SPT    Katheline Brendlinger 08/27/2023, 3:28 PM

## 2023-08-27 NOTE — Progress Notes (Signed)
Pharmacy Antibiotic Note  [name] is a 62 y.o. male w/ PHM of CVA, UC, HTN, HLDadmitted on 08/24/2023 with PNA. Pharmacy has been consulted for levofloxacin dosing. He has a noted penicillin (anaphylaxis) and cephalosporin (unknown reaction) allergy  Plan: Continue levofloxacin 750 mg IV every 24 hours (x 5 days) ---follow renal function for needed dose adjustments   Height: 5\' 10"  (177.8 cm) Weight: 75 kg (165 lb 5.5 oz) IBW/kg (Calculated) : 73  Temp (24hrs), Avg:98 F (36.7 C), Min:97.8 F (36.6 C), Max:98.3 F (36.8 C)  Recent Labs  Lab 08/24/23 0204 08/24/23 0336 08/25/23 2148 08/26/23 0624 08/27/23 0741  WBC 5.9  --   --  6.3 8.8  CREATININE 0.63  --  0.58* 0.54* 0.58*  LATICACIDVEN  --  1.1  --   --   --     Estimated Creatinine Clearance: 100.1 mL/min (A) (by C-G formula based on SCr of 0.58 mg/dL (L)).    Allergies  Allergen Reactions   Penicillins Anaphylaxis    Has patient had a PCN reaction causing immediate rash, facial/tongue/throat swelling, SOB or lightheadedness with hypotension: Yes Has patient had a PCN reaction causing severe rash involving mucus membranes or skin necrosis: No Has patient had a PCN reaction that required hospitalization No Has patient had a PCN reaction occurring within the last 10 years: No If all of the above answers are "NO", then may proceed with Cephalosporin use.   Cephalosporins Other (See Comments)    Reaction:  Unknown     Antimicrobials this admission: 09/15 levofloxacin >> 9/19  Microbiology results: 09/15 C diff (-) 9/15 bcx: ngtd 9/16 GI panel: negative 9/16 MRSA screen: negative  Thank you for allowing pharmacy to be a part of this patient's care.  Barrie Folk 08/27/2023 9:43 AM

## 2023-08-28 ENCOUNTER — Other Ambulatory Visit: Payer: Self-pay

## 2023-08-28 DIAGNOSIS — I10 Essential (primary) hypertension: Secondary | ICD-10-CM | POA: Diagnosis not present

## 2023-08-28 DIAGNOSIS — I2699 Other pulmonary embolism without acute cor pulmonale: Secondary | ICD-10-CM | POA: Diagnosis not present

## 2023-08-28 DIAGNOSIS — M6281 Muscle weakness (generalized): Secondary | ICD-10-CM | POA: Diagnosis not present

## 2023-08-28 DIAGNOSIS — K51918 Ulcerative colitis, unspecified with other complication: Secondary | ICD-10-CM | POA: Diagnosis not present

## 2023-08-28 DIAGNOSIS — R1312 Dysphagia, oropharyngeal phase: Secondary | ICD-10-CM | POA: Diagnosis not present

## 2023-08-28 DIAGNOSIS — S01411D Laceration without foreign body of right cheek and temporomandibular area, subsequent encounter: Secondary | ICD-10-CM | POA: Diagnosis not present

## 2023-08-28 DIAGNOSIS — L98491 Non-pressure chronic ulcer of skin of other sites limited to breakdown of skin: Secondary | ICD-10-CM | POA: Diagnosis not present

## 2023-08-28 DIAGNOSIS — K5190 Ulcerative colitis, unspecified, without complications: Secondary | ICD-10-CM | POA: Diagnosis not present

## 2023-08-28 DIAGNOSIS — S51011D Laceration without foreign body of right elbow, subsequent encounter: Secondary | ICD-10-CM | POA: Diagnosis not present

## 2023-08-28 DIAGNOSIS — D72829 Elevated white blood cell count, unspecified: Secondary | ICD-10-CM | POA: Diagnosis not present

## 2023-08-28 DIAGNOSIS — E871 Hypo-osmolality and hyponatremia: Secondary | ICD-10-CM | POA: Diagnosis not present

## 2023-08-28 DIAGNOSIS — R6510 Systemic inflammatory response syndrome (SIRS) of non-infectious origin without acute organ dysfunction: Secondary | ICD-10-CM | POA: Diagnosis not present

## 2023-08-28 DIAGNOSIS — E43 Unspecified severe protein-calorie malnutrition: Secondary | ICD-10-CM | POA: Diagnosis not present

## 2023-08-28 DIAGNOSIS — E559 Vitamin D deficiency, unspecified: Secondary | ICD-10-CM | POA: Diagnosis not present

## 2023-08-28 DIAGNOSIS — L97821 Non-pressure chronic ulcer of other part of left lower leg limited to breakdown of skin: Secondary | ICD-10-CM | POA: Diagnosis not present

## 2023-08-28 DIAGNOSIS — Z789 Other specified health status: Secondary | ICD-10-CM | POA: Diagnosis not present

## 2023-08-28 DIAGNOSIS — Z743 Need for continuous supervision: Secondary | ICD-10-CM | POA: Diagnosis not present

## 2023-08-28 DIAGNOSIS — S31000A Unspecified open wound of lower back and pelvis without penetration into retroperitoneum, initial encounter: Secondary | ICD-10-CM | POA: Diagnosis not present

## 2023-08-28 DIAGNOSIS — D649 Anemia, unspecified: Secondary | ICD-10-CM | POA: Diagnosis not present

## 2023-08-28 DIAGNOSIS — E785 Hyperlipidemia, unspecified: Secondary | ICD-10-CM | POA: Diagnosis not present

## 2023-08-28 DIAGNOSIS — I5A Non-ischemic myocardial injury (non-traumatic): Secondary | ICD-10-CM | POA: Diagnosis not present

## 2023-08-28 DIAGNOSIS — M7989 Other specified soft tissue disorders: Secondary | ICD-10-CM | POA: Diagnosis not present

## 2023-08-28 DIAGNOSIS — E782 Mixed hyperlipidemia: Secondary | ICD-10-CM | POA: Diagnosis not present

## 2023-08-28 DIAGNOSIS — A419 Sepsis, unspecified organism: Secondary | ICD-10-CM | POA: Diagnosis not present

## 2023-08-28 DIAGNOSIS — R911 Solitary pulmonary nodule: Secondary | ICD-10-CM | POA: Diagnosis not present

## 2023-08-28 DIAGNOSIS — L89150 Pressure ulcer of sacral region, unstageable: Secondary | ICD-10-CM | POA: Diagnosis not present

## 2023-08-28 DIAGNOSIS — N390 Urinary tract infection, site not specified: Secondary | ICD-10-CM | POA: Diagnosis not present

## 2023-08-28 DIAGNOSIS — R278 Other lack of coordination: Secondary | ICD-10-CM | POA: Diagnosis not present

## 2023-08-28 DIAGNOSIS — E876 Hypokalemia: Secondary | ICD-10-CM | POA: Diagnosis not present

## 2023-08-28 DIAGNOSIS — I82403 Acute embolism and thrombosis of unspecified deep veins of lower extremity, bilateral: Secondary | ICD-10-CM | POA: Diagnosis not present

## 2023-08-28 DIAGNOSIS — Z8673 Personal history of transient ischemic attack (TIA), and cerebral infarction without residual deficits: Secondary | ICD-10-CM | POA: Diagnosis not present

## 2023-08-28 DIAGNOSIS — M6282 Rhabdomyolysis: Secondary | ICD-10-CM | POA: Diagnosis not present

## 2023-08-28 DIAGNOSIS — R531 Weakness: Secondary | ICD-10-CM | POA: Diagnosis not present

## 2023-08-28 DIAGNOSIS — G9341 Metabolic encephalopathy: Secondary | ICD-10-CM | POA: Diagnosis not present

## 2023-08-28 DIAGNOSIS — K922 Gastrointestinal hemorrhage, unspecified: Secondary | ICD-10-CM | POA: Diagnosis not present

## 2023-08-28 DIAGNOSIS — N179 Acute kidney failure, unspecified: Secondary | ICD-10-CM | POA: Diagnosis not present

## 2023-08-28 DIAGNOSIS — I82409 Acute embolism and thrombosis of unspecified deep veins of unspecified lower extremity: Secondary | ICD-10-CM | POA: Diagnosis not present

## 2023-08-28 LAB — BASIC METABOLIC PANEL
Anion gap: 3 — ABNORMAL LOW (ref 5–15)
BUN: 14 mg/dL (ref 8–23)
CO2: 25 mmol/L (ref 22–32)
Calcium: 8.3 mg/dL — ABNORMAL LOW (ref 8.9–10.3)
Chloride: 103 mmol/L (ref 98–111)
Creatinine, Ser: 0.57 mg/dL — ABNORMAL LOW (ref 0.61–1.24)
GFR, Estimated: >60 mL/min (ref >60)
Glucose, Bld: 132 mg/dL — ABNORMAL HIGH (ref 70–99)
Potassium: 4 mmol/L (ref 3.5–5.1)
Sodium: 131 mmol/L — ABNORMAL LOW (ref 135–145)

## 2023-08-28 LAB — MISC LABCORP TEST (SEND OUT): Labcorp test code: 81950

## 2023-08-28 LAB — CBC
HCT: 30.5 % — ABNORMAL LOW (ref 39.0–52.0)
Hemoglobin: 10.3 g/dL — ABNORMAL LOW (ref 13.0–17.0)
MCH: 28.7 pg (ref 26.0–34.0)
MCHC: 33.8 g/dL (ref 30.0–36.0)
MCV: 85 fL (ref 80.0–100.0)
Platelets: 287 10*3/uL (ref 150–400)
RBC: 3.59 MIL/uL — ABNORMAL LOW (ref 4.22–5.81)
RDW: 14.9 % (ref 11.5–15.5)
WBC: 7.8 10*3/uL (ref 4.0–10.5)
nRBC: 0 % (ref 0.0–0.2)

## 2023-08-28 LAB — MAGNESIUM: Magnesium: 2.1 mg/dL (ref 1.7–2.4)

## 2023-08-28 LAB — PHOSPHORUS: Phosphorus: 2.3 mg/dL — ABNORMAL LOW (ref 2.5–4.6)

## 2023-08-28 LAB — HEPARIN LEVEL (UNFRACTIONATED): Heparin Unfractionated: >1.1 IU/mL — ABNORMAL HIGH (ref 0.30–0.70)

## 2023-08-28 MED ORDER — COLLAGENASE 250 UNIT/GM EX OINT
TOPICAL_OINTMENT | Freq: Every day | CUTANEOUS | 0 refills | Status: DC
  Filled 2023-08-28: qty 30, 30d supply, fill #0

## 2023-08-28 MED ORDER — GERHARDT'S BUTT CREAM
1.0000 | TOPICAL_CREAM | Freq: Two times a day (BID) | CUTANEOUS | 0 refills | Status: AC
  Filled 2023-08-28: qty 60, 30d supply, fill #0

## 2023-08-28 MED ORDER — VITAMIN B-1 100 MG PO TABS
100.0000 mg | ORAL_TABLET | Freq: Every day | ORAL | 2 refills | Status: DC
  Filled 2023-08-28: qty 30, 30d supply, fill #0

## 2023-08-28 MED ORDER — PREDNISONE 20 MG PO TABS
40.0000 mg | ORAL_TABLET | Freq: Every day | ORAL | 0 refills | Status: AC
  Filled 2023-08-28: qty 6, 3d supply, fill #0

## 2023-08-28 MED ORDER — FOLIC ACID 1 MG PO TABS
1.0000 mg | ORAL_TABLET | Freq: Every day | ORAL | 2 refills | Status: DC
  Filled 2023-08-28: qty 30, 30d supply, fill #0

## 2023-08-28 MED ORDER — ADULT MULTIVITAMIN W/MINERALS CH
1.0000 | ORAL_TABLET | Freq: Every day | ORAL | 2 refills | Status: DC
  Filled 2023-08-28: qty 30, 30d supply, fill #0

## 2023-08-28 MED ORDER — PREDNISONE 20 MG PO TABS: 40.0000 mg | ORAL_TABLET | Freq: Every day | ORAL | Status: DC

## 2023-08-28 MED ORDER — APIXABAN 5 MG PO TABS
ORAL_TABLET | ORAL | 2 refills | Status: AC
  Filled 2023-08-28: qty 60, 24d supply, fill #0

## 2023-08-28 MED ORDER — ZINC SULFATE 220 (50 ZN) MG PO TABS
220.0000 mg | ORAL_TABLET | Freq: Every day | ORAL | 2 refills | Status: DC
  Filled 2023-08-28: qty 30, 30d supply, fill #0

## 2023-08-28 MED ORDER — ASCORBIC ACID 500 MG PO TABS
500.0000 mg | ORAL_TABLET | Freq: Two times a day (BID) | ORAL | 2 refills | Status: DC
  Filled 2023-08-28: qty 30, 15d supply, fill #0

## 2023-08-28 MED ORDER — PROSOURCE PLUS PO LIQD
30.0000 mL | Freq: Two times a day (BID) | ORAL | 2 refills | Status: DC
  Filled 2023-08-28: qty 887, 15d supply, fill #0

## 2023-08-28 MED ORDER — MUPIROCIN 2 % EX OINT
TOPICAL_OINTMENT | Freq: Two times a day (BID) | CUTANEOUS | 0 refills | Status: DC
  Filled 2023-08-28: qty 22, 11d supply, fill #0

## 2023-08-28 NOTE — Progress Notes (Signed)
Discharge report called to [facility]; report given to receiving nurse Crystal, LPN.  AVS place in discharge packet by Dennison Mascot, RN.  EMS to transport patient.

## 2023-08-28 NOTE — Consult Note (Addendum)
[facility]  [facility] Inpatient Consult   08/28/2023  [name] 1961-04-12 [id number]  [place] [facility] remote coverage review for patient admitted to [facility] - coverage for [name] Liaison  [facility] [[facility]]  Accountable Care Organization [ACO] Patient: [name] [Dual]  Primary Care Provider:  System, Provider Not In [unable to find PCP] listed as unattributed provider - not in [facility]  Reason for review:  Patient was reviewed for less than 30 days readmission medium risk score list and on [facility] banner  Patient was screened for hospitalization and on behalf of [facility] Care Coordination to assess for post hospital community care needs.  Patient is being considered for a skilled nursing facility level of care for post hospital transition.   If the patient goes to a [facility] affiliated facility then, patient maybe listed on [facility] RN list with traditional Medicare and approved Medicare Advantage plans.    Plan:   Will alert the [facility] RN who follows this [facility] affiliated facility of findings of patient listed as unattributed provider.  For questions or referrals, please contact:   Charlesetta Shanks, RN, BSN, CCM [place] [facility] [place]  [facility], [facility] Liaison Direct Dial: [phone] or secure chat Website: [email or web address]

## 2023-08-28 NOTE — Discharge Summary (Signed)
Physician Discharge Summary   Patient: [name] MRN: [id number] DOB: May 09, 1961  Admit date:     08/24/2023  Discharge date: 08/28/23  Discharge Physician: [name] Duster   PCP: System, Provider Not In   Recommendations at discharge:    PCP follow up in 1 week. Hematology consultation for hypercoagulable work up. GI follow up as scheduled.  Discharge Diagnoses: Principal Problem:   DVT (deep venous thrombosis) (HCC) Active Problems:   HTN (hypertension)   Stroke (HCC)   HLD (hyperlipidemia)   Alcohol use   Ulcerative colitis, chronic (HCC)   Pneumonia  Resolved Problems:   * No resolved hospital problems. *  Hospital Course: [name] is a 62 y.o. male with medical history significant of ulcerative colitis, CVA, hypertension, hyperlipidemia presented with internal jugular DVT.  Patient noted to have been recently admitted at the end of August for ulcerative colitis associated GI bleeding as well as sepsis secondary to UTI.  Patient reports roughly 1 to 2 weeks of worsening lower extremity swelling and generalized malaise. He is admitted to hospitalist service for further management and evaluation.  CTA chest revealed with acute pulmonary embolism within the right lower lobe segment as well as multifocal lower bibasilar tree-in-bud opacities concerning for aspiration versus infection. Doppler studies revealed + occlusive internal jugular DVT on imaging, +occlusive thrombus right cephalic vein, + DVT left profundus, femoral, popliteal, and peroneal veins, +DVT right posterior tibial and peroneal veins. Patient is started on heparin drip per protocol. Patient is seen by vascular surgery team who advised no surgical intervention needed. Also GI team evaluated him advised IV steroid therapy for UC flare. IV steroids tapered to oral Prednisone. IV heparin drip changed to oral Eliquis therapy per pharmacy protocol. He will need hematology evaluation for hypercoagulable  work up. Patient has multiple pressure ulcers, seen by wound care team. I discussed with him regarding CODE STATUS, wished to be full code. Patient and his significant other explained about the current plan and need for SNF placement. PCP follow up advised. New scripts sent to pharmacy. They understand and agree with discharge plan.  Assessment and Plan: Acute pulmonary embolism * DVT (deep venous thrombosis) (HCC) + occlusive internal jugular DVT on imaging  +occlusive thrombus right cephalic vein. + DVT left profundus, femoral, popliteal, and peroneal veins +DVT right posterior tibial and peroneal veins. No vascular intervention per vascular team. Heparin gtt transitioned to Eliquis per pharmacy protocol. Hemoglobin remained stable. He will need hematology evaluation for hypercoagulable work up outpatient.   Ulcerative colitis, chronic (HCC) Noted admission 8/26--->8/31 for UC assd GI bleeding  No active GI bleeding, Hgb stable at 10. Cont balsalazide. C. Diff negative. GI advised IV steroids, tapered to oral. Outpatient GI follow up suggested.   Hyponatremia Improved with fluids. Na 131. Stable.   HTN (hypertension) BP stable  Continue home regimen    HLD (hyperlipidemia) Cont statin, he has prior h/o CVA.   Alcohol use Prior heavy alcohol use  No alcohol use for several months. Thiamine, folate, multivitamin.   Pneumonia CTA and ultrasound with noted internal jugular DVT also with imaging concerning for new multi lobar pneumonia Finished IV levoquin for total 5 days.   Multiple pressure ulcers. Pressure injury Sacrum Mid Unstageable, present on admission  Pressure injury Bilateral Deep Tissue injury to Buttocks, present on admission  Continue wound care as suggested. He would benefit from low air loss mattress.       Consultants: Vascular, GI Procedures performed: none  Disposition:  Skilled nursing facility Diet recommendation:  Discharge Diet Orders (From  admission, onward)     Start     Ordered   08/28/23 0000  Diet - low sodium heart healthy        08/28/23 1522           Cardiac diet DISCHARGE MEDICATION: Allergies as of 08/28/2023       Reactions   Penicillins Anaphylaxis   Has patient had a PCN reaction causing immediate rash, facial/tongue/throat swelling, SOB or lightheadedness with hypotension: Yes Has patient had a PCN reaction causing severe rash involving mucus membranes or skin necrosis: No Has patient had a PCN reaction that required hospitalization No Has patient had a PCN reaction occurring within the last 10 years: No If all of the above answers are "NO", then may proceed with Cephalosporin use.   Cephalosporins Other (See Comments)   Reaction:  Unknown         Medication List     STOP taking these medications    clopidogrel 75 MG tablet Commonly known as: PLAVIX       TAKE these medications    (feeding supplement) PROSource Plus liquid Take 30 mLs by mouth 2 (two) times daily between meals.   apixaban 5 MG Tabs tablet Commonly known as: ELIQUIS Take 2 tablets (10 mg total) by mouth 2 (two) times daily for 6 days, THEN 1 tablet (5 mg total) 2 (two) times daily. Start taking on: August 28, 2023   ascorbic acid 500 MG tablet Commonly known as: VITAMIN C Take 1 tablet (500 mg total) by mouth 2 (two) times daily.   balsalazide 750 MG capsule Commonly known as: COLAZAL Take 750 mg by mouth 3 (three) times daily.   collagenase 250 UNIT/GM ointment Commonly known as: SANTYL Apply topically daily. Start taking on: August 29, 2023   folic acid 1 MG tablet Commonly known as: FOLVITE Take 1 tablet (1 mg total) by mouth daily. Start taking on: August 29, 2023   Gerhardt's butt cream Crea Apply 1 Application topically 2 (two) times daily.   multivitamin with minerals Tabs tablet Take 1 tablet by mouth daily. Start taking on: August 29, 2023   mupirocin ointment 2 % Commonly known  as: BACTROBAN Apply topically 2 (two) times daily.   predniSONE 20 MG tablet Commonly known as: DELTASONE Take 2 tablets (40 mg total) by mouth daily with breakfast for 3 days. Start taking on: August 29, 2023   thiamine 100 MG tablet Commonly known as: Vitamin B-1 Take 1 tablet (100 mg total) by mouth daily. Start taking on: August 29, 2023   traZODone 100 MG tablet Commonly known as: DESYREL Take 100 mg by mouth at bedtime as needed for sleep.   Vitamin D3 125 MCG (5000 UT) Tabs Take 5,000 Units by mouth daily.   zinc sulfate 220 (50 Zn) MG capsule Take 1 capsule (220 mg total) by mouth daily. Start taking on: August 29, 2023               Discharge Care Instructions  (From admission, onward)           Start     Ordered   08/28/23 0000  Discharge wound care:       Comments: Wound care  Daily        Clean R elbow and Sacral wounds with NS, apply Santyl to wound beds daily, Apply 1/4" thick layer of Santyl to wound bed, top R elbow wound with saline  moist gauze, dry gauze and silicone foam. Apply Santyl to sacral wound then fill  wound with NS moistened gauze; using a Q tip applicator making sure to cover tunneling at 6 o'clock (towards rectum). May top with silicone foam or ABD pad whichever is preferred.     L buttock and R hip DTPI apply a layer of Xeroform to area daily and cover with silicone foam.    Wound care  2 times daily       Paint B knee wounds with Betadine 2 times a day and allow to air dry.   Full thickness R cheek apply Mupirocin 2 times a day and leave open to air.   08/28/23 1522            Discharge Exam: Filed Weights   08/24/23 0222 08/26/23 1603 08/27/23 0500  Weight: 72 kg 75.2 kg 75 kg   General - Elderly Caucasian ill male, no apparent distress HEENT - PERRLA, EOMI, pale conjunctiva, atraumatic head, non tender sinuses. Lung - Clear, diffuse rhonchi Heart - S1, S2 heard, no murmurs, rubs, 1+ pedal edema Neuro - Alert,  awake and oriented x 3, non focal exam. Skin - Warm and dry. extremity edema improved.  Condition at discharge: stable  The results of significant diagnostics from this hospitalization (including imaging, microbiology, ancillary and laboratory) are listed below for reference.   Imaging Studies: US Venous Img Upper Uni Right(DVT)  Result Date: 08/25/2023 CLINICAL DATA:  Edema for 2 weeks, bilateral lower extremity DVT and left internal jugular vein thrombus EXAM: RIGHT UPPER EXTREMITY VENOUS DOPPLER ULTRASOUND TECHNIQUE: Gray-scale sonography with graded compression, as well as color Doppler and duplex ultrasound were performed to evaluate the upper extremity deep venous system from the level of the subclavian vein and including the jugular, axillary, basilic, radial, ulnar and upper cephalic vein. Spectral Doppler was utilized to evaluate flow at rest and with distal augmentation maneuvers. COMPARISON:  08/25/2023, 08/24/2023 FINDINGS: Contralateral Subclavian Vein: Respiratory phasicity is normal and symmetric with the symptomatic side. No evidence of thrombus. Normal compressibility. Internal Jugular Vein: No evidence of thrombus. Normal compressibility, respiratory phasicity and response to augmentation. Subclavian Vein: No evidence of thrombus. Normal compressibility, respiratory phasicity and response to augmentation. Axillary Vein: No evidence of thrombus. Normal compressibility, respiratory phasicity and response to augmentation. Cephalic Vein: There is occlusive thrombus within the mid to distal right cephalic vein. Vessel is noncompressible with no Doppler flow detected in this region. The proximal aspect of the cephalic vein is patent. Basilic Vein: No evidence of thrombus. Normal compressibility, respiratory phasicity and response to augmentation. Brachial Veins: No evidence of thrombus. Normal compressibility, respiratory phasicity and response to augmentation. Radial Veins: No evidence of  thrombus. Normal compressibility, respiratory phasicity and response to augmentation. Ulnar Veins: No evidence of thrombus. Normal compressibility, respiratory phasicity and response to augmentation. Other Findings: Echogenic thrombus is again noted within the left internal jugular vein, unchanged since ultrasound performed yesterday. IMPRESSION: 1. Occlusive thrombus within the mid to distal right cephalic vein as above. No other deep venous thrombosis within the right upper extremity. 2. Stable thrombus within the left internal jugular vein, unchanged since ultrasound performed yesterday. Electronically Signed   By: Sharlet Salina M.D.   On: 08/25/2023 20:15   US Venous Img Lower Bilateral (DVT)  Result Date: 08/25/2023 CLINICAL DATA:  Edema EXAM: BILATERAL LOWER EXTREMITY VENOUS DOPPLER ULTRASOUND TECHNIQUE: Gray-scale sonography with graded compression, as well as color Doppler and duplex ultrasound were performed to evaluate the lower  extremity deep venous systems from the level of the common femoral vein and including the common femoral, femoral, profunda femoral, popliteal and calf veins including the posterior tibial, peroneal and gastrocnemius veins when visible. The superficial great saphenous vein was also interrogated. Spectral Doppler was utilized to evaluate flow at rest and with distal augmentation maneuvers in the common femoral, femoral and popliteal veins. COMPARISON:  None Available. FINDINGS: RIGHT LOWER EXTREMITY Common Femoral Vein: No evidence of thrombus. Normal compressibility, respiratory phasicity and response to augmentation. Saphenofemoral Junction: No evidence of thrombus. Normal compressibility and flow on color Doppler imaging. Profunda Femoral Vein: No evidence of thrombus. Normal compressibility and flow on color Doppler imaging. Femoral Vein: No evidence of thrombus. Normal compressibility, respiratory phasicity and response to augmentation. Popliteal Vein: No evidence of  thrombus. Normal compressibility, respiratory phasicity and response to augmentation. Calf Veins: Occlusive echogenic thrombus within the posterior tibial and peroneal veins. Superficial Great Saphenous Vein: No evidence of thrombus. Normal compressibility. Venous Reflux:  None. Other Findings:  None. LEFT LOWER EXTREMITY Common Femoral Vein: No evidence of thrombus. Normal compressibility, respiratory phasicity and response to augmentation. Saphenofemoral Junction: No evidence of thrombus. Normal compressibility and flow on color Doppler imaging. Profunda Femoral Vein: Near-occlusive thrombus within the left profundus femoral vein. Femoral Vein: No evidence of thrombus. Normal compressibility, respiratory phasicity and response to augmentation. Popliteal Vein: Partially occlusive thrombus within the left popliteal vein distally. Calf Veins: Partially occlusive thrombus in the left peroneal vein. Posterior tibial vein is patent. Superficial Great Saphenous Vein: No evidence of thrombus. Normal compressibility. Venous Reflux:  None. Other Findings:  None. IMPRESSION: 1. Positive for deep venous thrombosis involving the left profundus femoral, popliteal, and peroneal veins. 2. Positive for deep venous thrombosis involving the right posterior tibial and peroneal veins. No evidence of right lower extremity DVT above the level of the knee. These results will be called to the ordering clinician or representative by the Radiologist Assistant, and communication documented in the PACS or [employer]. Electronically Signed   By: Duanne Guess D.O.   On: 08/25/2023 19:48   CT ABDOMEN PELVIS W CONTRAST  Result Date: 08/25/2023 CLINICAL DATA:  Ulcerative colitis.  Severe diarrhea. EXAM: CT ABDOMEN AND PELVIS WITH CONTRAST TECHNIQUE: Multidetector CT imaging of the abdomen and pelvis was performed using the standard protocol following bolus administration of intravenous contrast. RADIATION DOSE REDUCTION: This exam  was performed according to the departmental dose-optimization program which includes automated exposure control, adjustment of the mA and/or kV according to patient size and/or use of iterative reconstruction technique. CONTRAST:  OMNIPAQUE IOHEXOL 300 MG/ML  SOLN COMPARISON:  CT 08/04/2023 abdomen pelvis.  Chest 08/24/2023 FINDINGS: Lower chest: Since the prior there is developing bronchial wall thickening along the lower lobes dependently with some developing reticulonodular opacities, left-greater-than-right. Trace left pleural effusion. Please correlate for an infectious or inflammatory process. Please correlate with the prior CT Hepatobiliary: Mild fatty liver infiltration. Gallbladder has some dependent increased density, possibly sludge or stones. Patent portal vein. Pancreas: Unremarkable. No pancreatic ductal dilatation or surrounding inflammatory changes. Spleen: Normal in size without focal abnormality. Adrenals/Urinary Tract: Adrenal glands are preserved. No enhancing renal mass or collecting system dilatation. The ureters have normal course and caliber extending down to the urinary bladder. Significant contrast in the bladder itself. Please correlate with previous CT angiogram of the chest from 08/24/2023 Stomach/Bowel: On this non oral contrast exam, the stomach and small bowel are nondilated. Rectal tube in place. There is significant wall thickening,  mucosal enhancement along the rectosigmoid colon. Lesser extent along the transverse and descending colon although these loops of colon are more featureless than usually seen consistent with the history of ulcerative colitis. Adjacent areas of inflammatory stranding identified. Presacral fluid and stranding is increased from previous exam as well. Vascular/Lymphatic: Aortic atherosclerosis. No enlarged abdominal or pelvic lymph nodes. Reproductive: Prostate is unremarkable. Other: Anasarca.  No free air. Musculoskeletal: Curvature of the spine with  moderate degenerative changes of the spine and pelvis. Bridging osteophytes along the sacroiliac joints. IMPRESSION: Increasing wall thickening, mucosal enhancement with the adjacent inflammatory changes along the rectosigmoid colon greater than descending colon. There is a relatively long segment of featureless colon consistent with the history of ulcerative colitis. No obstruction, free air or free fluid . Tree-in-bud like changes along the lung bases with bronchiectasis. Please correlate with the prior CT scan Electronically Signed   By: Karen Kays M.D.   On: 08/25/2023 11:36   US Venous Img Upper Uni Left  Result Date: 08/24/2023 CLINICAL DATA:  Left arm pain and swelling EXAM: LEFT UPPER EXTREMITY VENOUS DOPPLER ULTRASOUND TECHNIQUE: Gray-scale sonography with graded compression, as well as color Doppler and duplex ultrasound were performed to evaluate the upper extremity deep venous system from the level of the subclavian vein and including the jugular, axillary, basilic, radial, ulnar and upper cephalic vein. Spectral Doppler was utilized to evaluate flow at rest and with distal augmentation maneuvers. COMPARISON:  None Available. FINDINGS: Contralateral Subclavian Vein: Respiratory phasicity is normal and symmetric with the symptomatic side. No evidence of thrombus. Normal compressibility. Internal Jugular Vein: Thrombus is noted with decreased compressibility. Subclavian Vein: No evidence of thrombus. Normal compressibility, respiratory phasicity and response to augmentation. Axillary Vein: No evidence of thrombus. Normal compressibility, respiratory phasicity and response to augmentation. Cephalic Vein: No evidence of thrombus. Normal compressibility, respiratory phasicity and response to augmentation. Basilic Vein: No evidence of thrombus. Normal compressibility, respiratory phasicity and response to augmentation. Brachial Veins: No evidence of thrombus. Normal compressibility, respiratory phasicity  and response to augmentation. Radial Veins: No evidence of thrombus. Normal compressibility, respiratory phasicity and response to augmentation. Ulnar Veins: No evidence of thrombus. Normal compressibility, respiratory phasicity and response to augmentation. Venous Reflux:  None visualized. Other Findings:  None visualized. IMPRESSION: Thrombus is noted in the left internal jugular vein. This is occlusive in nature. No other thrombus is seen. Electronically Signed   By: Alcide Clever M.D.   On: 08/24/2023 03:48   CT Angio Chest PE W and/or Wo Contrast  Result Date: 08/24/2023 CLINICAL DATA:  Pulmonary embolism. EXAM: CT ANGIOGRAPHY CHEST WITH CONTRAST TECHNIQUE: Multidetector CT imaging of the chest was performed using the standard protocol during bolus administration of intravenous contrast. Multiplanar CT image reconstructions and MIPs were obtained to evaluate the vascular anatomy. RADIATION DOSE REDUCTION: This exam was performed according to the departmental dose-optimization program which includes automated exposure control, adjustment of the mA and/or kV according to patient size and/or use of iterative reconstruction technique. CONTRAST:  75mL OMNIPAQUE IOHEXOL 350 MG/ML SOLN COMPARISON:  Chest CT 08/04/2023 FINDINGS: Cardiovascular: There is a filling defect within the right lower lobe segmental arterial branch. No other pulmonary embolism. No evidence of right heart strain. Mediastinum/Nodes: No enlarged mediastinal, hilar, or axillary lymph nodes. Thyroid gland, trachea, and esophagus demonstrate no significant findings. Lungs/Pleura: Multifocal bibasilar nodular/tree-in-bud type opacities, new since 08/04/2023. No pleural effusion or pneumothorax. No pulmonary infarction. Central airways are patent. Upper Abdomen: No acute abnormality. Musculoskeletal: No chest wall  abnormality. No acute or significant osseous findings. Review of the MIP images confirms the above findings. IMPRESSION: 1. Acute  pulmonary embolism within a right lower lobe segmental arterial branch. No evidence of right heart strain. 2. Multifocal bibasilar nodular/tree-in-bud type opacities, new since 08/04/2023, concerning for aspiration/infection Critical Value/emergent results were called by telephone at the time of interpretation on 08/24/2023 at 3:42 am to provider Shaune Pollack , who verbally acknowledged these results. Electronically Signed   By: Deatra Robinson M.D.   On: 08/24/2023 03:43   MR BRAIN WO CONTRAST  Result Date: 08/04/2023 CLINICAL DATA:  Acute neurologic deficit. Bilateral lower extremity weakness EXAM: MRI HEAD WITHOUT CONTRAST TECHNIQUE: Multiplanar, multiecho pulse sequences of the brain and surrounding structures were obtained without intravenous contrast. COMPARISON:  08/22/2014 FINDINGS: Brain: No acute infarct, mass effect or extra-axial collection. No acute or chronic hemorrhage. There is confluent hyperintense T2-weighted signal within the white matter. Generalized volume loss. 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 The midline structures are normal. Vascular: Major flow voids are preserved. Skull and upper cervical spine: Normal calvarium and skull base. Visualized upper cervical spine and soft tissues are normal. Sinuses/Orbits:No paranasal sinus fluid levels or advanced mucosal thickening. No mastoid or middle ear effusion. Normal orbits. IMPRESSION: 1. No acute intracranial abnormality. 2. 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 3. 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 Electronically Signed   By: Deatra Robinson M.D.   On: 08/04/2023 22:50   CT CHEST ABDOMEN PELVIS WO CONTRAST  Result Date: 08/04/2023 CLINICAL DATA:  Patient was found down, potentially for 3 days. EXAM: CT CHEST, ABDOMEN AND PELVIS WITHOUT CONTRAST TECHNIQUE: Multidetector CT imaging of the chest, abdomen and pelvis was performed  following the standard protocol without IV contrast. RADIATION DOSE REDUCTION: This exam was performed according to the departmental dose-optimization program which includes automated exposure control, adjustment of the mA and/or kV according to patient size and/or use of iterative reconstruction technique. COMPARISON:  Abdomen and pelvis CT 11/22/2015 FINDINGS: CT CHEST FINDINGS Cardiovascular: The heart size is normal. No substantial pericardial effusion. Mild atherosclerotic calcification is noted in the wall of the thoracic aorta. Mediastinum/Nodes: No mediastinal lymphadenopathy. 9 mm posterior right thyroid nodule. Not clinically significant; no follow-up imaging recommended. No evidence for gross hilar lymphadenopathy although assessment is limited by the lack of intravenous contrast on the current study. The esophagus has normal imaging features. There is no axillary lymphadenopathy. (ref: J Am Coll Radiol. 2015 Feb;12(2): 143-50). Lungs/Pleura: Centrilobular and paraseptal emphysema evident. 3 mm posterior right lower lobe nodule identified on 146/4. 2 mm left upper lobe nodule identified on 60/4. No focal airspace consolidation. No pleural effusion. Musculoskeletal: No worrisome lytic or sclerotic osseous abnormality. CT ABDOMEN PELVIS FINDINGS Hepatobiliary: No suspicious focal abnormality in the liver on this study without intravenous contrast. Increased attenuation in the gallbladder lumen may reflect sludge. No intrahepatic or extrahepatic biliary dilation. Pancreas: No focal mass lesion. No dilatation of the main duct. No intraparenchymal cyst. No peripancreatic edema. Spleen: No splenomegaly. No suspicious focal mass lesion. Adrenals/Urinary Tract: No adrenal nodule or mass. Right kidney unremarkable. 1 mm nonobstructing stone identified interpolar left kidney with another punctate stone in the lower pole left kidney. No evidence for hydroureter. The urinary bladder appears normal for the degree of  distention. Stomach/Bowel: Stomach is unremarkable. No gastric wall thickening. No evidence of outlet obstruction. Duodenum is normally positioned as is the ligament of Treitz. No small  bowel wall thickening. No small bowel dilatation. The terminal ileum is normal. The appendix is not well visualized, but there is no edema or inflammation in the region of the cecal tip to suggest appendicitis. No gross colonic mass. No colonic wall thickening. Vascular/Lymphatic: There is mild atherosclerotic calcification of the abdominal aorta without aneurysm. There is no gastrohepatic or hepatoduodenal ligament lymphadenopathy. No retroperitoneal or mesenteric lymphadenopathy. Left-sided IVC noted (normal variant). No pelvic sidewall lymphadenopathy. Reproductive: The prostate gland and seminal vesicles are unremarkable. Other: No intraperitoneal free fluid. Musculoskeletal: No worrisome lytic or sclerotic osseous abnormality. IMPRESSION: 1. No acute findings in the chest, abdomen, or pelvis. Specifically, no acute findings in the chest, abdomen, or pelvis 2. Tiny bilateral pulmonary nodules measuring up to 3 mm. No follow-up needed if patient is low-risk (and has no known or suspected primary neoplasm). Non-contrast chest CT can be considered in 12 months if patient is high-risk. This recommendation follows the consensus statement: Guidelines for Management of Incidental Pulmonary Nodules Detected on CT Images: From the Fleischner Society 2017; Radiology 2017; 284:228-243. 3. Aortic Atherosclerosis (ICD10-I70.0) and Emphysema (ICD10-J43.9). Electronically Signed   By: Kennith Center M.D.   On: 08/04/2023 18:08   DG Knee 1-2 Views Left  Result Date: 08/04/2023 CLINICAL DATA:  Unwitnessed fall.  Left knee bruises. EXAM: LEFT KNEE - 1-2 VIEW COMPARISON:  None Available. FINDINGS: No evidence of fracture, dislocation, or joint effusion. No evidence of arthropathy or other focal bone abnormality. Soft tissues are unremarkable.  IMPRESSION: Negative. Electronically Signed   By: Lupita Raider M.D.   On: 08/04/2023 17:41   CT Head Wo Contrast  Result Date: 08/04/2023 CLINICAL DATA:  Neck trauma, intoxicated or obtunded (Age >= 16y); Mental status change, unknown cause. EXAM: CT HEAD WITHOUT CONTRAST CT CERVICAL SPINE WITHOUT CONTRAST TECHNIQUE: Multidetector CT imaging of the head and cervical spine was performed following the standard protocol without intravenous contrast. Multiplanar CT image reconstructions of the cervical spine were also generated. RADIATION DOSE REDUCTION: This exam was performed according to the departmental dose-optimization program which includes automated exposure control, adjustment of the mA and/or kV according to patient size and/or use of iterative reconstruction technique. COMPARISON:  CT head and cervical spine 08/20/2014. FINDINGS: CT HEAD FINDINGS Brain: Interval extension of encephalomalacia in the bilateral PCA territories with new encephalomalacia in the posterior right frontal lobe, consistent with chronic infarct. Unchanged small area of old infarct in the left middle frontal gyrus. Old lacunar infarcts in the bilateral cerebellar hemispheres no acute hemorrhage, hydrocephalus, extra-axial collection, mass effect or midline shift. Vascular: No hyperdense vessel or unexpected calcification. Skull: No calvarial fracture or suspicious bone lesion. Skull base is unremarkable. Sinuses/Orbits: No acute finding. Other: None. CT CERVICAL SPINE FINDINGS Alignment: Normal. Skull base and vertebrae: No acute fracture. Normal craniocervical junction. No suspicious bone lesions. Soft tissues and spinal canal: No prevertebral fluid or swelling. No visible canal hematoma. Disc levels: Mild cervical spondylosis without high-grade spinal canal stenosis. Upper chest: No acute findings. Other: None. IMPRESSION: 1. No acute intracranial abnormality. 2. Interval extension of encephalomalacia in the bilateral PCA  territories with new encephalomalacia in the posterior right frontal lobe, consistent with chronic infarct. 3. No acute cervical spine fracture or traumatic listhesis. Electronically Signed   By: Orvan Falconer M.D.   On: 08/04/2023 17:40   CT Cervical Spine Wo Contrast  Result Date: 08/04/2023 CLINICAL DATA:  Neck trauma, intoxicated or obtunded (Age >= 16y); Mental status change, unknown cause. EXAM: CT HEAD WITHOUT  CONTRAST CT CERVICAL SPINE WITHOUT CONTRAST TECHNIQUE: Multidetector CT imaging of the head and cervical spine was performed following the standard protocol without intravenous contrast. Multiplanar CT image reconstructions of the cervical spine were also generated. RADIATION DOSE REDUCTION: This exam was performed according to the departmental dose-optimization program which includes automated exposure control, adjustment of the mA and/or kV according to patient size and/or use of iterative reconstruction technique. COMPARISON:  CT head and cervical spine 08/20/2014. FINDINGS: CT HEAD FINDINGS Brain: Interval extension of encephalomalacia in the bilateral PCA territories with new encephalomalacia in the posterior right frontal lobe, consistent with chronic infarct. Unchanged small area of old infarct in the left middle frontal gyrus. Old lacunar infarcts in the bilateral cerebellar hemispheres no acute hemorrhage, hydrocephalus, extra-axial collection, mass effect or midline shift. Vascular: No hyperdense vessel or unexpected calcification. Skull: No calvarial fracture or suspicious bone lesion. Skull base is unremarkable. Sinuses/Orbits: No acute finding. Other: None. CT CERVICAL SPINE FINDINGS Alignment: Normal. Skull base and vertebrae: No acute fracture. Normal craniocervical junction. No suspicious bone lesions. Soft tissues and spinal canal: No prevertebral fluid or swelling. No visible canal hematoma. Disc levels: Mild cervical spondylosis without high-grade spinal canal stenosis. Upper  chest: No acute findings. Other: None. IMPRESSION: 1. No acute intracranial abnormality. 2. Interval extension of encephalomalacia in the bilateral PCA territories with new encephalomalacia in the posterior right frontal lobe, consistent with chronic infarct. 3. No acute cervical spine fracture or traumatic listhesis. Electronically Signed   By: Orvan Falconer M.D.   On: 08/04/2023 17:40   DG Forearm Right  Result Date: 08/04/2023 CLINICAL DATA:  Unwitnessed fall.  Bruising of right arm. EXAM: RIGHT FOREARM - 2 VIEW COMPARISON:  None Available. FINDINGS: There is no evidence of fracture or other focal bone lesions. Soft tissues are unremarkable. IMPRESSION: Negative. Electronically Signed   By: Lupita Raider M.D.   On: 08/04/2023 17:39   DG Humerus Left  Result Date: 08/04/2023 CLINICAL DATA:  Status post fall with multiple bruises to the upper and lower extremities EXAM: LEFT HUMERUS - 2 VIEW; LEFT FOREARM - 2 VIEW COMPARISON:  None Available. FINDINGS: There is no evidence of fracture or other focal bone lesions. Soft tissues are unremarkable. IMPRESSION: No acute fracture or dislocation. Electronically Signed   By: Agustin Cree M.D.   On: 08/04/2023 17:38   DG Forearm Left  Result Date: 08/04/2023 CLINICAL DATA:  Status post fall with multiple bruises to the upper and lower extremities EXAM: LEFT HUMERUS - 2 VIEW; LEFT FOREARM - 2 VIEW COMPARISON:  None Available. FINDINGS: There is no evidence of fracture or other focal bone lesions. Soft tissues are unremarkable. IMPRESSION: No acute fracture or dislocation. Electronically Signed   By: Agustin Cree M.D.   On: 08/04/2023 17:38   DG Tibia/Fibula Left  Result Date: 08/04/2023 CLINICAL DATA:  Unwitnessed fall.  Bruising of left leg. EXAM: LEFT TIBIA AND FIBULA - 2 VIEW COMPARISON:  None Available. FINDINGS: There is no evidence of fracture or other focal bone lesions. Soft tissues are unremarkable. IMPRESSION: Negative. Electronically Signed   By:  Lupita Raider M.D.   On: 08/04/2023 17:38   DG Tibia/Fibula Right  Result Date: 08/04/2023 CLINICAL DATA:  Multifocal bruises and pressure type wounds status post fall. GI bleed. EXAM: RIGHT KNEE - 1-2 VIEW; RIGHT TIBIA AND FIBULA - 2 VIEW COMPARISON:  None Available. FINDINGS: The mineralization and alignment are normal. There is no evidence of acute fracture or dislocation. The joint  spaces appear preserved at the ankle and knee. No evidence of joint effusion, foreign body or soft tissue emphysema. IMPRESSION: No acute osseous findings or significant soft tissue abnormalities identified within the right knee or lower leg. Electronically Signed   By: Carey Bullocks M.D.   On: 08/04/2023 17:35   DG Knee 1-2 Views Right  Result Date: 08/04/2023 CLINICAL DATA:  Multifocal bruises and pressure type wounds status post fall. GI bleed. EXAM: RIGHT KNEE - 1-2 VIEW; RIGHT TIBIA AND FIBULA - 2 VIEW COMPARISON:  None Available. FINDINGS: The mineralization and alignment are normal. There is no evidence of acute fracture or dislocation. The joint spaces appear preserved at the ankle and knee. No evidence of joint effusion, foreign body or soft tissue emphysema. IMPRESSION: No acute osseous findings or significant soft tissue abnormalities identified within the right knee or lower leg. Electronically Signed   By: Carey Bullocks M.D.   On: 08/04/2023 17:35   DG Humerus Right  Result Date: 08/04/2023 CLINICAL DATA:  Larey Seat, bruising EXAM: RIGHT HUMERUS - 2+ VIEW COMPARISON:  12/17/2013 FINDINGS: Frontal and lateral views of the right humerus are obtained. No acute displaced fracture. Alignment of the right shoulder and elbow is anatomic. Mild acromioclavicular and glenohumeral joint osteoarthritis. Soft tissues are unremarkable. IMPRESSION: 1. 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 2. No acute fracture. Electronically Signed   By: Sharlet Salina M.D.   On: 08/04/2023 17:32   DG Chest Port 1  View  Result Date: 08/04/2023 CLINICAL DATA:  Short of breath, gastrointestinal bleeding EXAM: PORTABLE CHEST 1 VIEW COMPARISON:  08/20/2014 FINDINGS: 2 frontal views of the chest demonstrate an unremarkable cardiac silhouette. No acute airspace disease, effusion, or pneumothorax. No acute bony abnormality. IMPRESSION: 1. No acute intrathoracic process. Electronically Signed   By: Sharlet Salina M.D.   On: 08/04/2023 16:36    Microbiology: Results for orders placed or performed during the hospital encounter of 08/24/23  C Difficile Quick Screen w PCR reflex     Status: None   Collection Time: 08/24/23  2:12 AM   Specimen: STOOL  Result Value Ref Range Status   C Diff antigen NEGATIVE NEGATIVE Final   C Diff toxin NEGATIVE NEGATIVE Final   C Diff interpretation No C. difficile detected.  Final    Comment: Performed at [facility], [address] [place], [place] [address]  Culture, blood (Routine X 2) w Reflex to ID Panel     Status: None (Preliminary result)   Collection Time: 08/24/23  9:10 AM   Specimen: BLOOD  Result Value Ref Range Status   Specimen Description BLOOD [facility]  Final   Special Requests   Final    BOTTLES DRAWN AEROBIC AND ANAEROBIC Blood Culture adequate volume   Culture   Final    NO GROWTH 4 DAYS Performed at [facility], [address] [place], [place] [address]    Report Status PENDING  Incomplete  Culture, blood (Routine X 2) w Reflex to ID Panel     Status: None (Preliminary result)   Collection Time: 08/24/23  9:10 AM   Specimen: BLOOD  Result Value Ref Range Status   Specimen Description BLOOD [facility]  Final   Special Requests   Final    BOTTLES DRAWN AEROBIC AND ANAEROBIC Blood Culture adequate volume   Culture   Final    NO GROWTH 4 DAYS Performed at [facility], [address] [place], [place] [address]    Report Status PENDING  Incomplete  Respiratory (~20 pathogens) panel by PCR     Status: None   Collection Time:  08/24/23  9:34 PM   Specimen: Nasopharyngeal Swab; Respiratory  Result Value Ref Range Status   Adenovirus NOT DETECTED NOT DETECTED Final   Coronavirus 229E NOT DETECTED NOT DETECTED Final    Comment: (NOTE) The Coronavirus on the Respiratory Panel, DOES NOT test for the novel  Coronavirus (2019 nCoV)    Coronavirus HKU1 NOT DETECTED NOT DETECTED Final   Coronavirus NL63 NOT DETECTED NOT DETECTED Final   Coronavirus OC43 NOT DETECTED NOT DETECTED Final   Metapneumovirus NOT DETECTED NOT DETECTED Final   Rhinovirus / Enterovirus NOT DETECTED NOT DETECTED Final   Influenza A NOT DETECTED NOT DETECTED Final   Influenza B NOT DETECTED NOT DETECTED Final   Parainfluenza Virus 1 NOT DETECTED NOT DETECTED Final   Parainfluenza Virus 2 NOT DETECTED NOT DETECTED Final   Parainfluenza Virus 3 NOT DETECTED NOT DETECTED Final   Parainfluenza Virus 4 NOT DETECTED NOT DETECTED Final   Respiratory Syncytial Virus NOT DETECTED NOT DETECTED Final   Bordetella pertussis NOT DETECTED NOT DETECTED Final   Bordetella Parapertussis NOT DETECTED NOT DETECTED Final   Chlamydophila pneumoniae NOT DETECTED NOT DETECTED Final   Mycoplasma pneumoniae NOT DETECTED NOT DETECTED Final    Comment: Performed at [facility], [address], [place], [place] [address]  C Difficile Quick Screen w PCR reflex     Status: None   Collection Time: 08/25/23 11:48 AM   Specimen: STOOL  Result Value Ref Range Status   C Diff antigen NEGATIVE NEGATIVE Final   C Diff toxin NEGATIVE NEGATIVE Final   C Diff interpretation No C. difficile detected.  Final    Comment: Performed at [facility], [address] [place], [place] [address]  Gastrointestinal Panel by PCR , Stool     Status: None   Collection Time: 08/25/23 11:48 AM   Specimen: STOOL  Result Value Ref Range Status   Campylobacter species NOT DETECTED NOT DETECTED Final   Plesimonas shigelloides NOT DETECTED NOT DETECTED Final   Salmonella species  NOT DETECTED NOT DETECTED Final   Yersinia enterocolitica NOT DETECTED NOT DETECTED Final   Vibrio species NOT DETECTED NOT DETECTED Final   Vibrio cholerae NOT DETECTED NOT DETECTED Final   Enteroaggregative E coli (EAEC) NOT DETECTED NOT DETECTED Final   Enteropathogenic E coli (EPEC) NOT DETECTED NOT DETECTED Final   Enterotoxigenic E coli (ETEC) NOT DETECTED NOT DETECTED Final   Shiga like toxin producing E coli (STEC) NOT DETECTED NOT DETECTED Final   Shigella/Enteroinvasive E coli (EIEC) NOT DETECTED NOT DETECTED Final   Cryptosporidium NOT DETECTED NOT DETECTED Final   Cyclospora cayetanensis NOT DETECTED NOT DETECTED Final   Entamoeba histolytica NOT DETECTED NOT DETECTED Final   Giardia lamblia NOT DETECTED NOT DETECTED Final   Adenovirus F40/41 NOT DETECTED NOT DETECTED Final   Astrovirus NOT DETECTED NOT DETECTED Final   Norovirus GI/GII NOT DETECTED NOT DETECTED Final   Rotavirus A NOT DETECTED NOT DETECTED Final   Sapovirus (I, II, IV, and V) NOT DETECTED NOT DETECTED Final    Comment: Performed at [facility], [address] [place], [place] [address]  MRSA Next Gen by PCR, Nasal     Status: None   Collection Time: 08/25/23  4:49 PM   Specimen: Nasal Mucosa; Nasal Swab  Result Value Ref Range Status   MRSA by PCR Next Gen NOT DETECTED NOT  DETECTED Final    Comment: (NOTE) The GeneXpert MRSA Assay (FDA approved for NASAL specimens only), is one component of a comprehensive MRSA colonization surveillance program. It is not intended to diagnose MRSA infection nor to guide or monitor treatment for MRSA infections. Test performance is not FDA approved in patients less than 7 years old. Performed at [facility], [address] [place], [place] [address]     Labs: CBC: Recent Labs  Lab 08/24/23 [phone] 08/24/23 0910 08/24/23 1831 08/25/23 0054 08/26/23 0624 08/27/23 0741 08/28/23 0319  WBC 5.9  --   --   --  6.3 8.8 7.8  NEUTROABS 3.2  --    --   --   --   --   --   HGB 11.5*   < > 11.3* 11.1* 11.4* 10.1* 10.3*  HCT 35.2*   < > 34.3* 32.6* 33.6* 29.8* 30.5*  MCV 88.0  --   --   --  85.5 85.9 85.0  PLT 443*  --   --   --  358 315 287   < > = values in this interval not displayed.   Basic Metabolic Panel: Recent Labs  Lab 08/24/23 0204 08/25/23 2148 08/26/23 0624 08/27/23 0741 08/28/23 0319  NA 133* 128* 132* 131* 131*  K 3.9 3.8 3.7 3.8 4.0  CL 100 97* 101 101 103  CO2 24 21* 23 22 25   GLUCOSE 85 142* 123* 125* 132*  BUN 5* 7* 8 12 14   CREATININE 0.63 0.58* 0.54* 0.58* 0.57*  CALCIUM 8.2* 8.0* 8.3* 8.0* 8.3*  MG  --   --  1.9 2.0 2.1  PHOS  --   --  3.8 2.7 2.3*   Liver Function Tests: Recent Labs  Lab 08/24/23 0204 08/25/23 2148  AST 15 13*  ALT 17 14  ALKPHOS 61 71  BILITOT 0.7 0.8  PROT 5.0* 5.1*  ALBUMIN 2.0* 1.8*   CBG: No results for input(s): "GLUCAP" in the last 168 hours.  Discharge time spent: 39 minutes.  Signed: [name] Duster, MD [facility] 08/28/2023

## 2023-08-28 NOTE — TOC Progression Note (Addendum)
Transition of Care [facility]) - Progression Note    Patient Details  Name: [name] MRN: [id number] Date of Birth: [birth year]  [facility] of [facility] CM/SW Contact  Chapman Fitch, RN Phone Number: 08/28/2023, 3:06 PM  Clinical Narrative:      Insurance auth valid for [facility] through 9/20.  MD aware.  Kenney Houseman at [facility] aware  Message sent to [facility] to determine if patient can return today   Expected Discharge Plan: Skilled Nursing Facility Barriers to Discharge: Continued Medical Work up  Expected Discharge Plan and Services       Living arrangements for the past 2 months: Single Family Home                                       Social Determinants of Health (SDOH) Interventions SDOH Screenings   Food Insecurity: No Food Insecurity (08/25/2023)  Housing: Low Risk  (08/25/2023)  Transportation Needs: No Transportation Needs (08/25/2023)  Recent Concern: Transportation Needs - Unmet Transportation Needs (08/04/2023)  Utilities: Not At Risk (08/25/2023)  Tobacco Use: Low Risk  (08/25/2023)    Readmission Risk Interventions     No data to display

## 2023-08-28 NOTE — TOC Transition Note (Signed)
Transition of Care [facility]) - CM/SW Discharge Note   Patient Details  Name: [name] MRN: [id number] Date of Birth: [birth year]  Transition of Care [facility]) CM/SW Contact:  Chapman Fitch, RN Phone Number: 08/28/2023, 3:46 PM   Clinical Narrative:     [id number]  Anticipated DC date: 08/28/23  Family notified: Darryll Capers  Transport by: Wendie Simmer  Per MD patient ready for DC to . RN, patient, patient's family, and facility notified of DC. Discharge Summary sent to facility. RN given number for report. DC packet on chart. Ambulance transport requested for patient.  TOC signing off.    Barriers to Discharge: Continued Medical Work up   Patient Goals and CMS Choice CMS Medicare.gov Compare Post Acute Care list provided to:: Patient Choice offered to / list presented to : Patient  Discharge Placement                         Discharge Plan and Services Additional resources added to the After Visit Summary for                                       Social Determinants of Health (SDOH) Interventions SDOH Screenings   Food Insecurity: No Food Insecurity (08/25/2023)  Housing: Low Risk  (08/25/2023)  Transportation Needs: No Transportation Needs (08/25/2023)  Recent Concern: Transportation Needs - Unmet Transportation Needs (08/04/2023)  Utilities: Not At Risk (08/25/2023)  Tobacco Use: Low Risk  (08/25/2023)     Readmission Risk Interventions     No data to display

## 2023-08-28 NOTE — Plan of Care (Signed)
Problem: Education: Goal: Knowledge of General Education information will improve Description: Including pain rating scale, medication(s)/side effects and non-pharmacologic comfort measures Outcome: Progressing   Problem: Nutrition: Goal: Adequate nutrition will be maintained Outcome: Progressing   Problem: Coping: Goal: Level of anxiety will decrease Outcome: Progressing   Problem: Elimination: Goal: Will not experience complications related to urinary retention Outcome: Progressing

## 2023-08-29 DIAGNOSIS — K5190 Ulcerative colitis, unspecified, without complications: Secondary | ICD-10-CM | POA: Diagnosis not present

## 2023-08-29 DIAGNOSIS — L89150 Pressure ulcer of sacral region, unstageable: Secondary | ICD-10-CM | POA: Diagnosis not present

## 2023-08-29 DIAGNOSIS — K922 Gastrointestinal hemorrhage, unspecified: Secondary | ICD-10-CM | POA: Diagnosis not present

## 2023-08-29 DIAGNOSIS — E43 Unspecified severe protein-calorie malnutrition: Secondary | ICD-10-CM | POA: Diagnosis not present

## 2023-08-29 LAB — CULTURE, BLOOD (ROUTINE X 2)
Culture: NO GROWTH
Culture: NO GROWTH
Special Requests: ADEQUATE
Special Requests: ADEQUATE

## 2023-08-29 LAB — COPPER, SERUM: Copper: 60 ug/dL — ABNORMAL LOW (ref 69–132)

## 2023-08-29 LAB — ZINC: Zinc: 40 ug/dL — ABNORMAL LOW (ref 44–115)

## 2023-08-30 LAB — VITAMIN C: Vitamin C: 0.4 mg/dL (ref 0.4–2.0)

## 2023-09-02 DIAGNOSIS — E43 Unspecified severe protein-calorie malnutrition: Secondary | ICD-10-CM | POA: Diagnosis not present

## 2023-09-02 DIAGNOSIS — E559 Vitamin D deficiency, unspecified: Secondary | ICD-10-CM | POA: Diagnosis not present

## 2023-09-02 DIAGNOSIS — E785 Hyperlipidemia, unspecified: Secondary | ICD-10-CM | POA: Diagnosis not present

## 2023-09-04 DIAGNOSIS — K5190 Ulcerative colitis, unspecified, without complications: Secondary | ICD-10-CM | POA: Diagnosis not present

## 2023-09-04 DIAGNOSIS — K922 Gastrointestinal hemorrhage, unspecified: Secondary | ICD-10-CM | POA: Diagnosis not present

## 2023-09-04 DIAGNOSIS — E43 Unspecified severe protein-calorie malnutrition: Secondary | ICD-10-CM | POA: Diagnosis not present

## 2023-09-04 DIAGNOSIS — L89150 Pressure ulcer of sacral region, unstageable: Secondary | ICD-10-CM | POA: Diagnosis not present

## 2023-09-04 LAB — VITAMIN E
Vitamin E (Alpha Tocopherol): 4.7 mg/L — ABNORMAL LOW (ref 9.0–29.0)
Vitamin E(Gamma Tocopherol): 0.8 mg/L (ref 0.5–4.9)

## 2023-09-04 LAB — VITAMIN A: Vitamin A (Retinoic Acid): 12.7 ug/dL — ABNORMAL LOW (ref 22.0–69.5)

## 2023-09-08 ENCOUNTER — Other Ambulatory Visit: Payer: Self-pay

## 2023-09-08 DIAGNOSIS — E871 Hypo-osmolality and hyponatremia: Secondary | ICD-10-CM | POA: Diagnosis not present

## 2023-09-08 DIAGNOSIS — D72829 Elevated white blood cell count, unspecified: Secondary | ICD-10-CM | POA: Diagnosis not present

## 2023-09-08 DIAGNOSIS — D649 Anemia, unspecified: Secondary | ICD-10-CM | POA: Diagnosis not present

## 2023-09-08 DIAGNOSIS — M7989 Other specified soft tissue disorders: Secondary | ICD-10-CM | POA: Diagnosis not present

## 2023-09-08 DIAGNOSIS — Z8673 Personal history of transient ischemic attack (TIA), and cerebral infarction without residual deficits: Secondary | ICD-10-CM | POA: Diagnosis not present

## 2023-09-09 DIAGNOSIS — D649 Anemia, unspecified: Secondary | ICD-10-CM | POA: Diagnosis not present

## 2023-09-09 DIAGNOSIS — D72829 Elevated white blood cell count, unspecified: Secondary | ICD-10-CM | POA: Diagnosis not present

## 2023-09-09 DIAGNOSIS — Z8673 Personal history of transient ischemic attack (TIA), and cerebral infarction without residual deficits: Secondary | ICD-10-CM | POA: Diagnosis not present

## 2023-09-09 DIAGNOSIS — S31000A Unspecified open wound of lower back and pelvis without penetration into retroperitoneum, initial encounter: Secondary | ICD-10-CM | POA: Diagnosis not present

## 2023-09-09 DIAGNOSIS — I10 Essential (primary) hypertension: Secondary | ICD-10-CM | POA: Diagnosis not present

## 2023-09-09 DIAGNOSIS — E785 Hyperlipidemia, unspecified: Secondary | ICD-10-CM | POA: Diagnosis not present

## 2023-09-10 ENCOUNTER — Other Ambulatory Visit: Payer: Self-pay | Admitting: Internal Medicine

## 2023-09-10 DIAGNOSIS — M4628 Osteomyelitis of vertebra, sacral and sacrococcygeal region: Secondary | ICD-10-CM

## 2023-09-11 DIAGNOSIS — K5190 Ulcerative colitis, unspecified, without complications: Secondary | ICD-10-CM | POA: Diagnosis not present

## 2023-09-11 DIAGNOSIS — E43 Unspecified severe protein-calorie malnutrition: Secondary | ICD-10-CM | POA: Diagnosis not present

## 2023-09-11 DIAGNOSIS — K922 Gastrointestinal hemorrhage, unspecified: Secondary | ICD-10-CM | POA: Diagnosis not present

## 2023-09-11 DIAGNOSIS — L89150 Pressure ulcer of sacral region, unstageable: Secondary | ICD-10-CM | POA: Diagnosis not present

## 2023-09-15 DIAGNOSIS — E43 Unspecified severe protein-calorie malnutrition: Secondary | ICD-10-CM | POA: Diagnosis not present

## 2023-09-15 DIAGNOSIS — K5190 Ulcerative colitis, unspecified, without complications: Secondary | ICD-10-CM | POA: Diagnosis not present

## 2023-09-15 DIAGNOSIS — L89150 Pressure ulcer of sacral region, unstageable: Secondary | ICD-10-CM | POA: Diagnosis not present

## 2023-09-15 DIAGNOSIS — K922 Gastrointestinal hemorrhage, unspecified: Secondary | ICD-10-CM | POA: Diagnosis not present

## 2023-09-20 DIAGNOSIS — D649 Anemia, unspecified: Secondary | ICD-10-CM | POA: Diagnosis not present

## 2023-09-20 DIAGNOSIS — D72829 Elevated white blood cell count, unspecified: Secondary | ICD-10-CM | POA: Diagnosis not present

## 2023-09-20 DIAGNOSIS — Z8673 Personal history of transient ischemic attack (TIA), and cerebral infarction without residual deficits: Secondary | ICD-10-CM | POA: Diagnosis not present

## 2023-09-20 DIAGNOSIS — E871 Hypo-osmolality and hyponatremia: Secondary | ICD-10-CM | POA: Diagnosis not present

## 2023-09-22 DIAGNOSIS — E43 Unspecified severe protein-calorie malnutrition: Secondary | ICD-10-CM | POA: Diagnosis not present

## 2023-09-22 DIAGNOSIS — L89150 Pressure ulcer of sacral region, unstageable: Secondary | ICD-10-CM | POA: Diagnosis not present

## 2023-09-22 DIAGNOSIS — K922 Gastrointestinal hemorrhage, unspecified: Secondary | ICD-10-CM | POA: Diagnosis not present

## 2023-09-22 DIAGNOSIS — K5190 Ulcerative colitis, unspecified, without complications: Secondary | ICD-10-CM | POA: Diagnosis not present

## 2023-09-23 ENCOUNTER — Encounter: Payer: Self-pay | Admitting: Dietician

## 2023-09-23 DIAGNOSIS — D649 Anemia, unspecified: Secondary | ICD-10-CM | POA: Diagnosis not present

## 2023-09-23 DIAGNOSIS — E876 Hypokalemia: Secondary | ICD-10-CM | POA: Diagnosis not present

## 2023-09-23 DIAGNOSIS — Z8673 Personal history of transient ischemic attack (TIA), and cerebral infarction without residual deficits: Secondary | ICD-10-CM | POA: Diagnosis not present

## 2023-09-23 DIAGNOSIS — D72829 Elevated white blood cell count, unspecified: Secondary | ICD-10-CM | POA: Diagnosis not present

## 2023-09-23 DIAGNOSIS — E871 Hypo-osmolality and hyponatremia: Secondary | ICD-10-CM | POA: Diagnosis not present

## 2023-09-23 NOTE — Progress Notes (Signed)
Nutrition Brief Note   Pt had vitamin labs sent out while he was hospitalized as pt with chronic non-healing wounds and pt is at high risk for malabsorption secondary to UC. Vitamin labs returned as follows:  Recommend:  - cholecalciferol 1,000 units po daily  - folic acid 1mg  po daily x 30 days - copper 2mg  po daily x 30 days -vitamin A 10,000 units po daily x 30 days -vitamin E 400 units po daily x 30 days -zinc 50mg  (elemental) po daily x 30 days -vitamin C 500mg  po BID until would healing complete  -Recommend rechecking levels every 2-3 months until within normal limits and then yearly.   Results faxed to PCP and [facility]   Betsey Holiday MS, RD, LDN Please refer to [facility] for RD and/or RD on-call/weekend/after hours pager

## 2023-09-24 DIAGNOSIS — E785 Hyperlipidemia, unspecified: Secondary | ICD-10-CM | POA: Diagnosis not present

## 2023-09-24 DIAGNOSIS — E43 Unspecified severe protein-calorie malnutrition: Secondary | ICD-10-CM | POA: Diagnosis not present

## 2023-10-01 DIAGNOSIS — L89150 Pressure ulcer of sacral region, unstageable: Secondary | ICD-10-CM | POA: Diagnosis not present

## 2023-10-01 DIAGNOSIS — K5190 Ulcerative colitis, unspecified, without complications: Secondary | ICD-10-CM | POA: Diagnosis not present

## 2023-10-01 DIAGNOSIS — K922 Gastrointestinal hemorrhage, unspecified: Secondary | ICD-10-CM | POA: Diagnosis not present

## 2023-10-01 DIAGNOSIS — E43 Unspecified severe protein-calorie malnutrition: Secondary | ICD-10-CM | POA: Diagnosis not present

## 2023-10-03 ENCOUNTER — Ambulatory Visit
Admission: RE | Admit: 2023-10-03 | Discharge: 2023-10-03 | Disposition: A | Discharge status: Home / Self Care | Payer: 59 | Source: Ambulatory Visit | Attending: Internal Medicine | Admitting: Internal Medicine

## 2023-10-03 DIAGNOSIS — M4628 Osteomyelitis of vertebra, sacral and sacrococcygeal region: Secondary | ICD-10-CM | POA: Diagnosis not present

## 2023-10-03 DIAGNOSIS — M609 Myositis, unspecified: Secondary | ICD-10-CM | POA: Diagnosis not present

## 2023-10-03 DIAGNOSIS — R609 Edema, unspecified: Secondary | ICD-10-CM | POA: Diagnosis not present

## 2023-10-03 MED ORDER — GADOBUTROL 1 MMOL/ML IV SOLN
7.0000 mL | Freq: Once | INTRAVENOUS | Status: AC | PRN
  Administered 2023-10-03: 7 mL via INTRAVENOUS

## 2023-10-07 DIAGNOSIS — L89150 Pressure ulcer of sacral region, unstageable: Secondary | ICD-10-CM | POA: Diagnosis not present

## 2023-10-07 DIAGNOSIS — L97821 Non-pressure chronic ulcer of other part of left lower leg limited to breakdown of skin: Secondary | ICD-10-CM | POA: Diagnosis not present

## 2023-10-07 DIAGNOSIS — L98491 Non-pressure chronic ulcer of skin of other sites limited to breakdown of skin: Secondary | ICD-10-CM | POA: Diagnosis not present

## 2023-10-09 ENCOUNTER — Emergency Department: Payer: 59

## 2023-10-09 ENCOUNTER — Other Ambulatory Visit: Payer: Self-pay

## 2023-10-09 ENCOUNTER — Inpatient Hospital Stay
Admission: EM | Admit: 2023-10-09 | Discharge: 2023-10-17 | DRG: 853 | Disposition: A | Discharge status: Skilled Nursing Facility | Payer: 59 | Source: Skilled Nursing Facility | Attending: Obstetrics and Gynecology | Admitting: Obstetrics and Gynecology

## 2023-10-09 DIAGNOSIS — E43 Unspecified severe protein-calorie malnutrition: Secondary | ICD-10-CM | POA: Diagnosis present

## 2023-10-09 DIAGNOSIS — K5190 Ulcerative colitis, unspecified, without complications: Secondary | ICD-10-CM | POA: Diagnosis present

## 2023-10-09 DIAGNOSIS — D62 Acute posthemorrhagic anemia: Secondary | ICD-10-CM | POA: Diagnosis not present

## 2023-10-09 DIAGNOSIS — Z8701 Personal history of pneumonia (recurrent): Secondary | ICD-10-CM

## 2023-10-09 DIAGNOSIS — Z881 Allergy status to other antibiotic agents status: Secondary | ICD-10-CM

## 2023-10-09 DIAGNOSIS — M4628 Osteomyelitis of vertebra, sacral and sacrococcygeal region: Secondary | ICD-10-CM | POA: Diagnosis not present

## 2023-10-09 DIAGNOSIS — E871 Hypo-osmolality and hyponatremia: Secondary | ICD-10-CM | POA: Diagnosis not present

## 2023-10-09 DIAGNOSIS — E876 Hypokalemia: Secondary | ICD-10-CM | POA: Diagnosis not present

## 2023-10-09 DIAGNOSIS — D72829 Elevated white blood cell count, unspecified: Secondary | ICD-10-CM | POA: Diagnosis not present

## 2023-10-09 DIAGNOSIS — Z8673 Personal history of transient ischemic attack (TIA), and cerebral infarction without residual deficits: Secondary | ICD-10-CM

## 2023-10-09 DIAGNOSIS — K922 Gastrointestinal hemorrhage, unspecified: Secondary | ICD-10-CM | POA: Diagnosis present

## 2023-10-09 DIAGNOSIS — A419 Sepsis, unspecified organism: Secondary | ICD-10-CM

## 2023-10-09 DIAGNOSIS — L89159 Pressure ulcer of sacral region, unspecified stage: Secondary | ICD-10-CM | POA: Diagnosis not present

## 2023-10-09 DIAGNOSIS — I10 Essential (primary) hypertension: Secondary | ICD-10-CM | POA: Diagnosis not present

## 2023-10-09 DIAGNOSIS — R278 Other lack of coordination: Secondary | ICD-10-CM | POA: Diagnosis not present

## 2023-10-09 DIAGNOSIS — I5A Non-ischemic myocardial injury (non-traumatic): Secondary | ICD-10-CM | POA: Diagnosis not present

## 2023-10-09 DIAGNOSIS — Z7401 Bed confinement status: Secondary | ICD-10-CM | POA: Diagnosis not present

## 2023-10-09 DIAGNOSIS — R6510 Systemic inflammatory response syndrome (SIRS) of non-infectious origin without acute organ dysfunction: Secondary | ICD-10-CM | POA: Diagnosis not present

## 2023-10-09 DIAGNOSIS — K529 Noninfective gastroenteritis and colitis, unspecified: Secondary | ICD-10-CM | POA: Diagnosis not present

## 2023-10-09 DIAGNOSIS — A4159 Other Gram-negative sepsis: Principal | ICD-10-CM | POA: Diagnosis present

## 2023-10-09 DIAGNOSIS — L89154 Pressure ulcer of sacral region, stage 4: Secondary | ICD-10-CM | POA: Diagnosis present

## 2023-10-09 DIAGNOSIS — S51011D Laceration without foreign body of right elbow, subsequent encounter: Secondary | ICD-10-CM | POA: Diagnosis not present

## 2023-10-09 DIAGNOSIS — S01411D Laceration without foreign body of right cheek and temporomandibular area, subsequent encounter: Secondary | ICD-10-CM | POA: Diagnosis not present

## 2023-10-09 DIAGNOSIS — R000 Tachycardia, unspecified: Secondary | ICD-10-CM | POA: Diagnosis not present

## 2023-10-09 DIAGNOSIS — Z833 Family history of diabetes mellitus: Secondary | ICD-10-CM

## 2023-10-09 DIAGNOSIS — L89150 Pressure ulcer of sacral region, unstageable: Secondary | ICD-10-CM | POA: Diagnosis not present

## 2023-10-09 DIAGNOSIS — L89516 Pressure-induced deep tissue damage of right ankle: Secondary | ICD-10-CM | POA: Diagnosis not present

## 2023-10-09 DIAGNOSIS — Z86711 Personal history of pulmonary embolism: Secondary | ICD-10-CM | POA: Diagnosis not present

## 2023-10-09 DIAGNOSIS — R64 Cachexia: Secondary | ICD-10-CM | POA: Diagnosis not present

## 2023-10-09 DIAGNOSIS — B964 Proteus (mirabilis) (morganii) as the cause of diseases classified elsewhere: Secondary | ICD-10-CM | POA: Diagnosis present

## 2023-10-09 DIAGNOSIS — Z7901 Long term (current) use of anticoagulants: Secondary | ICD-10-CM | POA: Diagnosis not present

## 2023-10-09 DIAGNOSIS — E785 Hyperlipidemia, unspecified: Secondary | ICD-10-CM | POA: Diagnosis not present

## 2023-10-09 DIAGNOSIS — L89616 Pressure-induced deep tissue damage of right heel: Secondary | ICD-10-CM | POA: Diagnosis not present

## 2023-10-09 DIAGNOSIS — E872 Acidosis, unspecified: Secondary | ICD-10-CM | POA: Diagnosis not present

## 2023-10-09 DIAGNOSIS — Z681 Body mass index (BMI) 19 or less, adult: Secondary | ICD-10-CM | POA: Diagnosis not present

## 2023-10-09 DIAGNOSIS — D649 Anemia, unspecified: Secondary | ICD-10-CM | POA: Diagnosis not present

## 2023-10-09 DIAGNOSIS — I2699 Other pulmonary embolism without acute cor pulmonale: Secondary | ICD-10-CM | POA: Diagnosis not present

## 2023-10-09 DIAGNOSIS — R54 Age-related physical debility: Secondary | ICD-10-CM | POA: Diagnosis present

## 2023-10-09 DIAGNOSIS — M8610 Other acute osteomyelitis, unspecified site: Secondary | ICD-10-CM | POA: Diagnosis not present

## 2023-10-09 DIAGNOSIS — R1312 Dysphagia, oropharyngeal phase: Secondary | ICD-10-CM | POA: Diagnosis not present

## 2023-10-09 DIAGNOSIS — L89526 Pressure-induced deep tissue damage of left ankle: Secondary | ICD-10-CM | POA: Diagnosis not present

## 2023-10-09 DIAGNOSIS — I82409 Acute embolism and thrombosis of unspecified deep veins of unspecified lower extremity: Secondary | ICD-10-CM | POA: Diagnosis not present

## 2023-10-09 DIAGNOSIS — M6282 Rhabdomyolysis: Secondary | ICD-10-CM | POA: Diagnosis not present

## 2023-10-09 DIAGNOSIS — Z880 Allergy status to penicillin: Secondary | ICD-10-CM

## 2023-10-09 DIAGNOSIS — R0689 Other abnormalities of breathing: Secondary | ICD-10-CM | POA: Diagnosis not present

## 2023-10-09 DIAGNOSIS — L89626 Pressure-induced deep tissue damage of left heel: Secondary | ICD-10-CM | POA: Diagnosis not present

## 2023-10-09 DIAGNOSIS — M6281 Muscle weakness (generalized): Secondary | ICD-10-CM | POA: Diagnosis not present

## 2023-10-09 DIAGNOSIS — N179 Acute kidney failure, unspecified: Secondary | ICD-10-CM | POA: Diagnosis not present

## 2023-10-09 DIAGNOSIS — Z8249 Family history of ischemic heart disease and other diseases of the circulatory system: Secondary | ICD-10-CM

## 2023-10-09 DIAGNOSIS — Z803 Family history of malignant neoplasm of breast: Secondary | ICD-10-CM

## 2023-10-09 DIAGNOSIS — Z79899 Other long term (current) drug therapy: Secondary | ICD-10-CM | POA: Diagnosis not present

## 2023-10-09 DIAGNOSIS — K51011 Ulcerative (chronic) pancolitis with rectal bleeding: Secondary | ICD-10-CM | POA: Diagnosis not present

## 2023-10-09 DIAGNOSIS — L089 Local infection of the skin and subcutaneous tissue, unspecified: Secondary | ICD-10-CM

## 2023-10-09 DIAGNOSIS — Z5320 Procedure and treatment not carried out because of patient's decision for unspecified reasons: Secondary | ICD-10-CM | POA: Diagnosis not present

## 2023-10-09 DIAGNOSIS — R6520 Severe sepsis without septic shock: Secondary | ICD-10-CM | POA: Diagnosis not present

## 2023-10-09 DIAGNOSIS — K51918 Ulcerative colitis, unspecified with other complication: Secondary | ICD-10-CM | POA: Diagnosis not present

## 2023-10-09 DIAGNOSIS — Z743 Need for continuous supervision: Secondary | ICD-10-CM | POA: Diagnosis not present

## 2023-10-09 DIAGNOSIS — E782 Mixed hyperlipidemia: Secondary | ICD-10-CM | POA: Diagnosis not present

## 2023-10-09 DIAGNOSIS — G9341 Metabolic encephalopathy: Secondary | ICD-10-CM | POA: Diagnosis not present

## 2023-10-09 DIAGNOSIS — Z86718 Personal history of other venous thrombosis and embolism: Secondary | ICD-10-CM

## 2023-10-09 HISTORY — DX: Air embolism (traumatic), initial encounter: T79.0XXA

## 2023-10-09 LAB — COMPREHENSIVE METABOLIC PANEL
ALT: 18 U/L (ref 0–44)
AST: 38 U/L (ref 15–41)
Albumin: 1.7 g/dL — ABNORMAL LOW (ref 3.5–5.0)
Alkaline Phosphatase: 84 U/L (ref 38–126)
Anion gap: 10 (ref 5–15)
BUN: 24 mg/dL — ABNORMAL HIGH (ref 8–23)
CO2: 30 mmol/L (ref 22–32)
Calcium: 8.3 mg/dL — ABNORMAL LOW (ref 8.9–10.3)
Chloride: 95 mmol/L — ABNORMAL LOW (ref 98–111)
Creatinine, Ser: 0.84 mg/dL (ref 0.61–1.24)
GFR, Estimated: >60 mL/min (ref >60)
Glucose, Bld: 90 mg/dL (ref 70–99)
Potassium: 3.3 mmol/L — ABNORMAL LOW (ref 3.5–5.1)
Sodium: 135 mmol/L (ref 135–145)
Total Bilirubin: 0.9 mg/dL (ref 0.3–1.2)
Total Protein: 5.5 g/dL — ABNORMAL LOW (ref 6.5–8.1)

## 2023-10-09 LAB — CBC WITH DIFFERENTIAL/PLATELET
Abs Immature Granulocytes: 0.11 10*3/uL — ABNORMAL HIGH (ref 0.00–0.07)
Basophils Absolute: 0 10*3/uL (ref 0.0–0.1)
Basophils Relative: 0 %
Eosinophils Absolute: 0 10*3/uL (ref 0.0–0.5)
Eosinophils Relative: 0 %
HCT: 27.1 % — ABNORMAL LOW (ref 39.0–52.0)
Hemoglobin: 8.8 g/dL — ABNORMAL LOW (ref 13.0–17.0)
Immature Granulocytes: 1 %
Lymphocytes Relative: 7 %
Lymphs Abs: 0.9 10*3/uL (ref 0.7–4.0)
MCH: 26.3 pg (ref 26.0–34.0)
MCHC: 32.5 g/dL (ref 30.0–36.0)
MCV: 81.1 fL (ref 80.0–100.0)
Monocytes Absolute: 0.4 10*3/uL (ref 0.1–1.0)
Monocytes Relative: 3 %
Neutro Abs: 12.2 10*3/uL — ABNORMAL HIGH (ref 1.7–7.7)
Neutrophils Relative %: 89 %
Platelets: 376 10*3/uL (ref 150–400)
RBC: 3.34 MIL/uL — ABNORMAL LOW (ref 4.22–5.81)
RDW: 14.3 % (ref 11.5–15.5)
WBC: 13.6 10*3/uL — ABNORMAL HIGH (ref 4.0–10.5)
nRBC: 0 % (ref 0.0–0.2)

## 2023-10-09 LAB — PROTIME-INR
INR: 1.5 — ABNORMAL HIGH (ref 0.8–1.2)
Prothrombin Time: 18.6 s — ABNORMAL HIGH (ref 11.4–15.2)

## 2023-10-09 LAB — LACTIC ACID, PLASMA
Lactic Acid, Venous: 2.6 mmol/L (ref 0.5–1.9)
Lactic Acid, Venous: 1.6 mmol/L (ref 0.5–1.9)

## 2023-10-09 MED ORDER — ACETAMINOPHEN 650 MG RE SUPP: 650.0000 mg | Freq: Four times a day (QID) | RECTAL | Status: AC | PRN

## 2023-10-09 MED ORDER — IOHEXOL 300 MG/ML  SOLN
100.0000 mL | Freq: Once | INTRAMUSCULAR | Status: AC | PRN
  Administered 2023-10-09: 100 mL via INTRAVENOUS

## 2023-10-09 MED ORDER — ONDANSETRON HCL 4 MG PO TABS: 4.0000 mg | ORAL_TABLET | Freq: Four times a day (QID) | ORAL | Status: AC | PRN

## 2023-10-09 MED ORDER — VITAMIN C 500 MG PO TABS
500.0000 mg | ORAL_TABLET | Freq: Two times a day (BID) | ORAL | Status: DC
  Administered 2023-10-09 – 2023-10-17 (×15): 500 mg via ORAL
  Filled 2023-10-09 (×16): qty 1

## 2023-10-09 MED ORDER — PANTOPRAZOLE SODIUM 40 MG IV SOLR
80.0000 mg | Freq: Two times a day (BID) | INTRAVENOUS | Status: AC
  Administered 2023-10-09 – 2023-10-10 (×3): 80 mg via INTRAVENOUS
  Filled 2023-10-09 (×3): qty 20

## 2023-10-09 MED ORDER — ONDANSETRON HCL 4 MG/2ML IJ SOLN
4.0000 mg | Freq: Four times a day (QID) | INTRAMUSCULAR | Status: AC | PRN
  Administered 2023-10-10: 4 mg via INTRAVENOUS

## 2023-10-09 MED ORDER — VANCOMYCIN HCL 750 MG/150ML IV SOLN
750.0000 mg | Freq: Two times a day (BID) | INTRAVENOUS | Status: DC
  Administered 2023-10-10: 1000 mg via INTRAVENOUS
  Administered 2023-10-10 – 2023-10-13 (×7): 750 mg via INTRAVENOUS
  Filled 2023-10-09 (×8): qty 150

## 2023-10-09 MED ORDER — LACTATED RINGERS IV SOLN
INTRAVENOUS | Status: AC
  Administered 2023-10-10: 125 mL/h via INTRAVENOUS

## 2023-10-09 MED ORDER — SODIUM CHLORIDE 0.9 % IV SOLN: Freq: Once | INTRAVENOUS | Status: DC

## 2023-10-09 MED ORDER — HEPARIN BOLUS VIA INFUSION
4000.0000 [IU] | Freq: Once | INTRAVENOUS | Status: AC
  Administered 2023-10-09: 4000 [IU] via INTRAVENOUS
  Filled 2023-10-09: qty 4000

## 2023-10-09 MED ORDER — LEVOFLOXACIN IN D5W 750 MG/150ML IV SOLN
750.0000 mg | INTRAVENOUS | Status: DC
  Filled 2023-10-09: qty 150

## 2023-10-09 MED ORDER — ACETAMINOPHEN 325 MG PO TABS: 650.0000 mg | ORAL_TABLET | Freq: Four times a day (QID) | ORAL | Status: AC | PRN

## 2023-10-09 MED ORDER — ADULT MULTIVITAMIN W/MINERALS CH
1.0000 | ORAL_TABLET | Freq: Every day | ORAL | Status: DC
  Administered 2023-10-10 – 2023-10-17 (×8): 1 via ORAL
  Filled 2023-10-09 (×8): qty 1

## 2023-10-09 MED ORDER — THIAMINE MONONITRATE 100 MG PO TABS
100.0000 mg | ORAL_TABLET | Freq: Every day | ORAL | Status: DC
  Administered 2023-10-10 – 2023-10-17 (×8): 100 mg via ORAL
  Filled 2023-10-09 (×8): qty 1

## 2023-10-09 MED ORDER — HEPARIN (PORCINE) 25000 UT/250ML-% IV SOLN
1500.0000 [IU]/h | INTRAVENOUS | Status: DC
  Administered 2023-10-09: 1500 [IU]/h via INTRAVENOUS
  Filled 2023-10-09: qty 250

## 2023-10-09 MED ORDER — VANCOMYCIN HCL IN DEXTROSE 1-5 GM/200ML-% IV SOLN
1000.0000 mg | Freq: Once | INTRAVENOUS | Status: AC
  Administered 2023-10-09: 1000 mg via INTRAVENOUS
  Filled 2023-10-09: qty 200

## 2023-10-09 MED ORDER — POTASSIUM CHLORIDE CRYS ER 20 MEQ PO TBCR
20.0000 meq | EXTENDED_RELEASE_TABLET | Freq: Once | ORAL | Status: AC
  Administered 2023-10-09: 20 meq via ORAL
  Filled 2023-10-09: qty 1

## 2023-10-09 MED ORDER — SENNOSIDES-DOCUSATE SODIUM 8.6-50 MG PO TABS: 1.0000 | ORAL_TABLET | Freq: Every evening | ORAL | Status: DC | PRN

## 2023-10-09 MED ORDER — SODIUM CHLORIDE 0.9 % IV BOLUS (SEPSIS)
1000.0000 mL | Freq: Once | INTRAVENOUS | Status: AC
  Administered 2023-10-09: 1000 mL via INTRAVENOUS

## 2023-10-09 MED ORDER — PROSOURCE PLUS PO LIQD
30.0000 mL | Freq: Two times a day (BID) | ORAL | Status: DC
  Administered 2023-10-10 – 2023-10-17 (×12): 30 mL via ORAL
  Filled 2023-10-09 (×16): qty 30

## 2023-10-09 MED ORDER — ZINC SULFATE 220 (50 ZN) MG PO CAPS
220.0000 mg | ORAL_CAPSULE | Freq: Every day | ORAL | Status: DC
  Administered 2023-10-10 – 2023-10-17 (×8): 220 mg via ORAL
  Filled 2023-10-09 (×8): qty 1

## 2023-10-09 MED ORDER — TRAZODONE HCL 100 MG PO TABS
100.0000 mg | ORAL_TABLET | Freq: Every evening | ORAL | Status: DC | PRN
  Administered 2023-10-10 – 2023-10-11 (×2): 100 mg via ORAL
  Filled 2023-10-09 (×2): qty 1

## 2023-10-09 MED ORDER — LEVOFLOXACIN IN D5W 750 MG/150ML IV SOLN
750.0000 mg | Freq: Once | INTRAVENOUS | Status: AC
  Administered 2023-10-09: 750 mg via INTRAVENOUS
  Filled 2023-10-09: qty 150

## 2023-10-09 MED ORDER — MORPHINE SULFATE (PF) 2 MG/ML IV SOLN: 2.0000 mg | INTRAVENOUS | Status: AC | PRN

## 2023-10-09 MED ORDER — FENTANYL CITRATE PF 50 MCG/ML IJ SOSY: 25.0000 ug | PREFILLED_SYRINGE | INTRAMUSCULAR | Status: AC | PRN

## 2023-10-09 MED ORDER — HYDRALAZINE HCL 20 MG/ML IJ SOLN
5.0000 mg | Freq: Four times a day (QID) | INTRAMUSCULAR | Status: AC | PRN
Start: 2023-10-09 — End: 2023-10-14

## 2023-10-09 MED ORDER — VITAMIN D3 25 MCG (1000 UNIT) PO TABS
5000.0000 [IU] | ORAL_TABLET | Freq: Every day | ORAL | Status: DC
  Administered 2023-10-10 – 2023-10-17 (×8): 5000 [IU] via ORAL
  Filled 2023-10-09 (×14): qty 5

## 2023-10-09 NOTE — ED Notes (Signed)
Verbal consent for blood administration obtained from Britta Mccreedy (girlfriend and emergency contact); verified by Lillia Abed, RN who signed consent along with this RN.

## 2023-10-09 NOTE — ED Notes (Signed)
Patient cleaned of urinary and bowel incontinence; new brief applied.

## 2023-10-09 NOTE — ED Notes (Signed)
Dr. Maurine Minister at bedside evaluating patient; Doctor was made aware by this RN that patient's sacral dressing had not been changed due to all staff members involved in care continue to remove dressing to evaluate wound. Dr. Maurine Minister ok with this plan.

## 2023-10-09 NOTE — Assessment & Plan Note (Signed)
Hydralazine 5 mg IV every 6 hours as needed for SBP greater 170, 5 days ordered

## 2023-10-09 NOTE — Assessment & Plan Note (Addendum)
Sepsis is a possibility given patient's elevated lactic acid of 2.6, leukocytosis, increased heart rate, increased respiration rate, source of infection is sacral wound concerning for osteomyelitis Patient is status post sodium chloride 2 L bolus per EDP Continue with LR infusion at 125 mL/h Goal MAP greater than 65 on admission

## 2023-10-09 NOTE — ED Notes (Signed)
Patient in CT, girlfriend at bedside and updated on patient's care.

## 2023-10-09 NOTE — Progress Notes (Signed)
Pharmacy Antibiotic Note  [name] is a 62 y.o. 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  Pharmacy has been consulted for vancomycin and levofloxacin dosing.  Plan:  1) start vancomycin 1000 mg IV x 1 then 750 mg IV every 12 hours Goal AUC 400-550. Expected AUC: 500.0 SCr used: 0.84 mg/dL Ke 4.403K-7, Q2/5 95G  2) start levofloxacin 750 mg IV every 24 hours --follow renal function for needed dose adjustments  Height: 5\' 10"  (177.8 cm) Weight: 59.6 kg (131 lb 6.3 oz) IBW/kg (Calculated) : 73  Temp (24hrs), Avg:99 F (37.2 C), Min:99 F (37.2 C), Max:99 F (37.2 C)  Recent Labs  Lab 10/09/23 1144  WBC 13.6*  CREATININE 0.84  LATICACIDVEN 2.6*    Estimated Creatinine Clearance: 77.9 mL/min (by C-G formula based on SCr of 0.84 mg/dL).    Allergies  Allergen Reactions   Penicillins Anaphylaxis    Has patient had a PCN reaction causing immediate rash, facial/tongue/throat swelling, SOB or lightheadedness with hypotension: Yes Has patient had a PCN reaction causing severe rash involving mucus membranes or skin necrosis: No Has patient had a PCN reaction that required hospitalization No Has patient had a PCN reaction occurring within the last 10 years: No If all of the above answers are "NO", then may proceed with Cephalosporin use.   Cephalosporins Other (See Comments)    Reaction:  Unknown     Antimicrobials this admission: 10/31 vancomycin >>  10/31 levofloxacin >>   Microbiology results: 10/31 BCx: pending  Thank you for allowing pharmacy to be a part of this patient's care.  Lowella Bandy 10/09/2023 2:50 PM

## 2023-10-09 NOTE — ED Notes (Signed)
Pt resting comfortably

## 2023-10-09 NOTE — Progress Notes (Signed)
PHARMACY - ANTICOAGULATION CONSULT NOTE  Pharmacy Consult for heparin infusion Indication: h/o DVT, PE  Allergies  Allergen Reactions   Penicillins Anaphylaxis    Has patient had a PCN reaction causing immediate rash, facial/tongue/throat swelling, SOB or lightheadedness with hypotension: Yes Has patient had a PCN reaction causing severe rash involving mucus membranes or skin necrosis: No Has patient had a PCN reaction that required hospitalization No Has patient had a PCN reaction occurring within the last 10 years: No If all of the above answers are "NO", then may proceed with Cephalosporin use.   Cephalosporins Other (See Comments)    Reaction:  Unknown     Patient Measurements: Height: 5\' 10"  (177.8 cm) Weight: 59.6 kg (131 lb 6.3 oz) IBW/kg (Calculated) : 73 Heparin Dosing Weight: 59.6 kg  Vital Signs: Temp: 99 F (37.2 C) (10/31 1144) Temp Source: Rectal (10/31 1144) BP: 82/61 (10/31 1515) Pulse Rate: 105 (10/31 1515)  Labs: Recent Labs    10/09/23 1144  HGB 8.8*  HCT 27.1*  PLT 376  LABPROT 18.6*  INR 1.5*  CREATININE 0.84    Estimated Creatinine Clearance: 77.9 mL/min (by C-G formula based on SCr of 0.84 mg/dL).   Medical History: Past Medical History:  Diagnosis Date   Dysphagia    Hyperlipidemia    Hypertension    Pulmonary air embolism (HCC)    Stroke (HCC)    Ulcerative colitis (HCC)     Medications:  Scheduled:   (feeding supplement) PROSource Plus  30 mL Oral BID BM   ascorbic acid  500 mg Oral BID   [START ON 10/10/2023] cholecalciferol  5,000 Units Oral Daily   [START ON 10/10/2023] multivitamin with minerals  1 tablet Oral Daily   pantoprazole (PROTONIX) IV  80 mg Intravenous BID   potassium chloride  20 mEq Oral Once   [START ON 10/10/2023] thiamine  100 mg Oral Daily   [START ON 10/10/2023] zinc sulfate  220 mg Oral Daily    Assessment: 62 y.o. male w/ PMH of HTN, HLD, CVA, bedbound state, history of internal jugular DVT and pulmonary  embolism in right lower lobe admitted on 10/09/2023 with sacral osteomyelitis. Last dose of apixaban 10/08/23 2200    Baseline labs: INR 1.5, Hgb 8.8, PLT wnl  Goal of Therapy:  Heparin level 0.3-0.7 units/ml aPTT 66 - 102 seconds Monitor platelets by anticoagulation protocol: Yes   Plan:  ---Give 4000 units IV heparin bolus x 1 ---Start heparin infusion at 1500 units/hr (rate based on data from previous recent admission) ---Check aPTT level in 6 hours and at least once daily while on heparin (aPTT will be used to guide therapy given recent apixaban administration) ---Continue to monitor H&H and platelets  Lowella Bandy 10/09/2023,3:54 PM

## 2023-10-09 NOTE — ED Provider Notes (Signed)
[facility] Provider Note    Event Date/Time   First MD Initiated Contact with Patient 10/09/23 1147     (approximate)   History   Chief Complaint: Wound Infection   HPI  [name] is a 62 y.o. male with a history of stroke, hypertension, hyperlipidemia, who [facility] due to worsening deterioration of his sacral decubitus wound with foul odor.  Patient denies pain currently but reports being cold.     Physical Exam   Triage Vital Signs: ED Triage Vitals  Encounter Vitals Group     BP 10/09/23 1144 (!) 77/67     Systolic BP Percentile --      Diastolic BP Percentile --      Pulse Rate 10/09/23 1144 (!) 120     Resp 10/09/23 1144 20     Temp 10/09/23 1144 99 F (37.2 C)     Temp Source 10/09/23 1144 Rectal     SpO2 10/09/23 1144 96 %     Weight 10/09/23 1137 131 lb 6.3 oz (59.6 kg)     Height 10/09/23 1137 5\' 10"  (1.778 m)     Head Circumference --      Peak Flow --      Pain Score 10/09/23 1137 0     Pain Loc --      Pain Education --      Exclude from Growth Chart --     Most recent vital signs: Vitals:   10/09/23 1230 10/09/23 1319  BP: 107/72 90/60  Pulse: 100 (!) 103  Resp: (!) 23 (!) 26  Temp:    SpO2: 100% 100%    General: Awake, no distress.  Chronically ill-appearing CV:  Good peripheral perfusion.  Tachycardia heart rate 105 Resp:  Normal effort.  Tachypnea, respiratory rate 24.  Clear lungs Abd:  No distention.  Soft nontender Other:  Dry oral mucosa.  Posterior examination reveals brown stool on the perineum.  Large sacral decubitus ulcer, unstageable with necrotic tissue in the base.  Extensive undermining of the tissue margins.  Scant purulent drainage.   ED Results / Procedures / Treatments   Labs (all labs ordered are listed, but only abnormal results are displayed) Labs Reviewed  COMPREHENSIVE METABOLIC PANEL - Abnormal; Notable for the following components:      Result  Value   Potassium 3.3 (*)    Chloride 95 (*)    BUN 24 (*)    Calcium 8.3 (*)    Total Protein 5.5 (*)    Albumin 1.7 (*)    All other components within normal limits  LACTIC ACID, PLASMA - Abnormal; Notable for the following components:   Lactic Acid, Venous 2.6 (*)    All other components within normal limits  CBC WITH DIFFERENTIAL/PLATELET - Abnormal; Notable for the following components:   WBC 13.6 (*)    RBC 3.34 (*)    Hemoglobin 8.8 (*)    HCT 27.1 (*)    Neutro Abs 12.2 (*)    Abs Immature Granulocytes 0.11 (*)    All other components within normal limits  PROTIME-INR - Abnormal; Notable for the following components:   Prothrombin Time 18.6 (*)    INR 1.5 (*)    All other components within normal limits  CULTURE, BLOOD (ROUTINE X 2)  CULTURE, BLOOD (ROUTINE X 2)  LACTIC ACID, PLASMA  URINALYSIS, W/ REFLEX TO CULTURE (INFECTION SUSPECTED)     EKG Interpreted by me  Sinus tachycardia rate 118.  Normal axis, normal intervals.  Poor R wave progression.  Normal ST segments and T waves.  4 PVCs on the strip.   RADIOLOGY Chest x-ray interpreted by me No pneumothorax, pneumonia, or edema.  Radiology report reviewed   PROCEDURES:  .Critical Care  Performed by: Sharman Cheek, MD Authorized by: Sharman Cheek, MD   Critical care provider statement:    Critical care time (minutes):  35   Critical care time was exclusive of:  Separately billable procedures and treating other patients   Critical care was necessary to treat or prevent imminent or life-threatening deterioration of the following conditions:  Sepsis and shock   Critical care was time spent personally by me on the following activities:  Development of treatment plan with patient or surrogate, discussions with consultants, evaluation of patient's response to treatment, examination of patient, obtaining history from patient or surrogate, ordering and performing treatments and interventions, ordering and  review of laboratory studies, ordering and review of radiographic studies, pulse oximetry, re-evaluation of patient's condition and review of old charts   Care discussed with: admitting provider      MEDICATIONS ORDERED IN ED: Medications  levofloxacin (LEVAQUIN) IVPB 750 mg (750 mg Intravenous New Bag/Given 10/09/23 1317)  sodium chloride 0.9 % bolus 1,000 mL (0 mLs Intravenous Stopped 10/09/23 1318)    And  sodium chloride 0.9 % bolus 1,000 mL (1,000 mLs Intravenous New Bag/Given 10/09/23 1317)  vancomycin (VANCOCIN) IVPB 1000 mg/200 mL premix (0 mg Intravenous Stopped 10/09/23 1318)  iohexol (OMNIPAQUE) 300 MG/ML solution 100 mL (100 mLs Intravenous Contrast Given 10/09/23 1248)     IMPRESSION / MDM / ASSESSMENT AND PLAN / ED COURSE  I reviewed the triage vital signs and the nursing notes.  DDx: Sacral osteomyelitis, soft tissue abscess, necrotizing fasciitis, cellulitis, electrolyte abnormality, AKI  Patient's presentation is most consistent with acute presentation with potential threat to life or bodily function.  Patient presents with malaise, chills, has clinically apparent infection of his chronic sacral decubitus wound which is unstageable but deep and wide.  Will start broad-spectrum antibiotics, obtain CT abdomen pelvis, plan to admit.  ----------------------------------------- 2:09 PM on 10/09/2023 ----------------------------------------- Sepsis reassessment completed. BP improved. Vasopressors not indicated. Case d/w hospitalist.       FINAL CLINICAL IMPRESSION(S) / ED DIAGNOSES   Final diagnoses:  Pressure injury of sacral region, unstageable (HCC)  Wound infection  sepsis   Rx / DC Orders   ED Discharge Orders     None        Note:  This document was prepared using Dragon voice recognition software and may include unintentional dictation errors.   Sharman Cheek, MD 10/09/23 1409

## 2023-10-09 NOTE — Assessment & Plan Note (Addendum)
Patient denies symptoms of GI bleeding including nausea, vomiting, diarrhea, stool with abnormal color Suspect secondary to sacral decubitus bleeding Baseline hemoglobin is 10.1-11.4 Hemoglobin on admission is 8.8, no indication for transfusion at this time Recheck cbc a.m.

## 2023-10-09 NOTE — H&P (Addendum)
History and Physical   [name] PPI:[id number] DOB: 05-23-1961 DOA: 10/09/2023  PCP: System, Provider Not In Outpatient Specialists: Dr. Ardine [facility] neurology Patient coming from: [facility] via EMS  I have personally briefly reviewed patient's old medical records in [facility] EMR.  Chief Concern: Smelling sacral wound, failed outpatient antibiotic therapy  HPI: 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  Vitals in the ED showed temperature of 99, respiration rate 25, heart rate of 106, blood pressure 113/68, SpO2 of 99% on room air.  Serum sodium is 135, potassium 3.3, chloride 95, bicarb 30, BUN of 25, serum creatinine of 0.84, EGFR greater than 60, nonfasting blood glucose 90, WBC 13.6, hemoglobin 8.8, platelets of 376.  Lactic acid was 2.6.  ED treatment: Levofloxacin 750 mg IV one-time dose, vancomycin, sodium chloride 2 L bolus. ------------------------------ At bedside, he is able to tell me his name, age, current location, current calendar   He denies fever, nausea, vomtiing.  He reports he has been struggling with his wounds for a long time.  His girlfriend at bedside reports that he dismissed physical therapy at the SNF and refuses to participate.  He denies chest pain, shortness of breath, abdominal pain, dysuria, hematuria, fever, chills, nausea, vomiting.  Social history: He lives at [employer]. He denies tobacco, etoh, and recreational drug use.  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  ROS: Constitutional: no weight change, no fever ENT/Mouth: no sore throat, no rhinorrhea Eyes: no eye pain, no vision changes Cardiovascular: no chest pain, no dyspnea,  no edema, no  palpitations Respiratory: no cough, no sputum, no wheezing Gastrointestinal: no nausea, no vomiting, no diarrhea, no constipation Genitourinary: no urinary incontinence, no dysuria, no hematuria Musculoskeletal: no arthralgias, no myalgias Skin: + skin lesions and odor, no pruritus, Neuro: + weakness, no loss of consciousness, no syncope Psych: no anxiety, no depression, + decrease appetite Heme/Lymph: no bruising, no bleeding  ED Course: With emergency medicine provider, patient requiring hospitalization for chief concerns of sacral decubitus wounds, concerning for osteomyelitis.  Assessment/Plan  Principal Problem:   Sacral osteomyelitis (HCC) Active Problems:   SIRS (systemic inflammatory response syndrome) (HCC)   GI bleeding   HTN (hypertension)   HLD (hyperlipidemia)   Ulcerative colitis, chronic (HCC)   Acute blood loss anemia   Protein-calorie malnutrition, severe   Hypokalemia   Assessment and Plan:  * Sacral osteomyelitis (HCC) Concerning for sacral osteomyelitis in setting of chronic bedbound state and refusal to participate with physical therapy at nursing facility Pending CT read Continue with vancomycin and levofloxacin per pharmacy Blood cultures x 2 are in process UA has been ordered and pending collection Surgical consult placed for debridement Type and screen, 2 units of PRBC ordered for holding in anticipation of increased bleeding  SIRS (systemic inflammatory response syndrome) (HCC) Sepsis is a possibility given patient's elevated lactic acid of 2.6, leukocytosis, increased heart rate, increased respiration rate, source of infection is sacral wound concerning for osteomyelitis Patient is status post sodium chloride 2 L bolus per EDP Continue with LR infusion at 125 mL/h Goal MAP greater than 65 on admission  GI bleeding History of GI bleeding secondary to ulcerative colitis  HTN (hypertension) Hydralazine 5 mg IV every 6 hours as needed for SBP  greater 170, 5 days ordered  Hypokalemia Potassium chloride 20 mill equivalent p.o. one-time dose ordered Check magnesium on admission  Protein-calorie malnutrition, severe Home vitamin C 500 mg p.o. twice daily, multivitamin, thiamine 100 mg daily, vitamin D 5000 units daily, zinc sulfate 220 mg p.o. daily were resumed on admission Home feeding supplement 2 times daily between meals were resumed on admission  Acute blood loss anemia Patient denies symptoms of GI bleeding including nausea, vomiting, diarrhea, stool with abnormal color Suspect secondary to sacral decubitus bleeding Baseline hemoglobin is 10.1-11.4 Hemoglobin on admission is 8.8, no indication for transfusion at this time Recheck cbc a.m.  DVT prophylaxis-AM team to resume home Eliquis when the benefits outweigh the risk  Chart reviewed.   DVT prophylaxis: Heparin per pharmacy ordered; Home Eliquis not resumed on admission, pending general surgery evaluation Code Status: Full code Diet: Heart healthy diet; n.p.o. after midnight pending surgical evaluation for surgical debridement Family Communication: Lysle Pearl, girlfriend at bedside was updated with patient's permission Disposition Plan: Pending clinical course Consults called: General Surgery, pharmacy Admission status: PCU, inpatient  Past Medical History:  Diagnosis Date   Dysphagia    Hyperlipidemia    Hypertension    Pulmonary air embolism (HCC)    Stroke (HCC)    Ulcerative colitis (HCC)    Past Surgical History:  Procedure Laterality Date   COLONOSCOPY WITH PROPOFOL N/A 02/08/2016   Procedure: COLONOSCOPY WITH PROPOFOL;  Surgeon: Wallace Cullens, MD;  Location: [facility] ENDOSCOPY;  Service: Gastroenterology;  Laterality: N/A;   ESOPHAGOGASTRODUODENOSCOPY (EGD) WITH PROPOFOL N/A 02/08/2016   Procedure: ESOPHAGOGASTRODUODENOSCOPY (EGD) WITH PROPOFOL;  Surgeon: Wallace Cullens, MD;  Location: [facility] ENDOSCOPY;  Service: Gastroenterology;  Laterality: N/A;   NO PAST  SURGERIES     Social History:  reports that he has never smoked. He has never used smokeless tobacco. He reports current alcohol use. He reports current drug use. Drug: Marijuana.  Allergies  Allergen Reactions   Penicillins Anaphylaxis    Has patient had a PCN reaction causing immediate rash, facial/tongue/throat swelling, SOB or lightheadedness with hypotension: Yes Has patient had a PCN reaction causing severe rash involving mucus membranes or skin necrosis: No Has patient had a PCN reaction that required hospitalization No Has patient had a PCN reaction occurring within the last 10 years: No If all of the above answers are "NO", then may proceed with Cephalosporin use.   Cephalosporins Other (See Comments)    Reaction:  Unknown    Family History  Problem Relation Age of Onset   Breast cancer Mother    Diabetes Father    Hypertension Father    Breast cancer Sister    Family history: Family history reviewed and not pertinent.  Prior to Admission medications   Medication Sig Start Date End Date Taking? Authorizing Provider  apixaban (ELIQUIS) 5 MG TABS tablet Take 2 tablets (10 mg total) by mouth 2 (two) times daily for 6 days, THEN 1 tablet (5 mg total) 2 (two) times daily. 08/28/23 10/03/23  [name] Duster, MD  ascorbic acid (VITAMIN C) 500 MG tablet Take 1 tablet (500 mg total) by mouth 2 (two) times daily. 08/28/23   [name] Duster, MD  balsalazide (COLAZAL) 750 MG capsule Take 750 mg by mouth 3 (three) times daily. Patient not taking: Reported on 08/24/2023    [provider]  Cholecalciferol (VITAMIN D3) 5000 UNITS TABS Take 5,000 Units by mouth daily.    [provider]  collagenase (SANTYL) 250 UNIT/GM ointment Apply 1  application topically daily. 08/29/23   [name] Duster, MD  folic acid (FOLVITE) 1 MG tablet Take 1 tablet (1 mg total) by mouth daily. 08/29/23   [name] Duster, MD  Multiple Vitamin (MULTIVITAMIN WITH MINERALS) TABS  tablet Take 1 tablet by mouth daily. 08/29/23   [name] Duster, MD  mupirocin ointment (BACTROBAN) 2 % Apply 1 application topically 2 (two) times daily. 08/28/23   [name] Duster, MD  Nutritional Supplements (,FEEDING SUPPLEMENT, PROSOURCE PLUS) liquid Take 30 mLs by mouth 2 (two) times daily between meals. 08/28/23   [name] Duster, MD  thiamine (VITAMIN B-1) 100 MG tablet Take 1 tablet (100 mg total) by mouth daily. 08/29/23   [name] Duster, MD  traZODone (DESYREL) 100 MG tablet Take 100 mg by mouth at bedtime as needed for sleep.    [provider]  Zinc Sulfate 220 (50 Zn) MG TABS Take 1 tablet (220 mg total) by mouth daily. 08/29/23   [name] Duster, MD   Physical Exam: Vitals:   10/09/23 1522 10/09/23 1545 10/09/23 1615 10/09/23 1629  BP: 100/60 (!) 89/67 90/64   Pulse:  (!) 111 (!) 111   Resp: (!) 21 10 19    Temp:    97.8 F (36.6 C)  TempSrc:    Oral  SpO2:  100% 100%   Weight:      Height:       Constitutional: appears older than chronological age, frail, cachectic, acutely ill Eyes: PERRL, lids and conjunctivae normal HENMT: Bilateral temporal wasting, mucous membranes are dry. Posterior pharynx clear of any exudate or lesions.Poor dentition. Hearing appropriate Neck: normal, supple, no masses, no thyromegaly Respiratory: clear to auscultation bilaterally, no wheezing, no crackles. Normal respiratory effort. No accessory muscle use.  Cardiovascular: Regular rate and rhythm, no murmurs / rubs / gallops. No extremity edema. 2+ pedal pulses. No carotid bruits.  Abdomen: Scaphoid abdomen, no tenderness, no masses palpated, no hepatosplenomegaly. Bowel sounds positive.  Musculoskeletal: no clubbing / cyanosis. No joint deformity upper and lower extremities. Good ROM, no contractures, no atrophy. Normal muscle tone.  Skin: Sacral decubitus wound, unstageable. Neurologic: Sensation intact. Strength 3/5 in bilateral upper extremity.  Strength  bilateral lower extremities weaker than the upper extremity.  He can wiggle his toes and ankles a little bit at my request. Psychiatric: Lacks judgment and insight. Alert and oriented x 3. Normal mood.   EKG: independently reviewed, showing sinus tachycardia with rate of 118, QTc 473  Chest x-ray on Admission: I personally reviewed and I agree with radiologist reading as below.  CT ABDOMEN PELVIS W CONTRAST  Result Date: 10/09/2023 CLINICAL DATA:  Sepsis. Sacral decubitus ulcer. Ulcerative colitis. EXAM: CT ABDOMEN AND PELVIS WITH CONTRAST TECHNIQUE: Multidetector CT imaging of the abdomen and pelvis was performed using the standard protocol following bolus administration of intravenous contrast. RADIATION DOSE REDUCTION: This exam was performed according to the departmental dose-optimization program which includes automated exposure control, adjustment of the mA and/or kV according to patient size and/or use of iterative reconstruction technique. CONTRAST:  OMNIPAQUE IOHEXOL 300 MG/ML  SOLN COMPARISON:  08/25/2023 FINDINGS: Lower Chest: Tiny left pleural effusion and mild dependent atelectasis. Hepatobiliary: No suspicious hepatic masses identified. Gallbladder is unremarkable. No evidence of biliary ductal dilatation. Pancreas:  No mass or inflammatory changes. Spleen: Within normal limits in size and appearance. Adrenals/Urinary Tract: No suspicious masses identified. No evidence of ureteral calculi or hydronephrosis. Stomach/Bowel: Mild diffuse colonic wall thickening and mucosal hyperenhancement is again seen, consistent with mild colitis. No evidence of  involvement of terminal ileum or other small bowel. No evidence of bowel obstruction or abscess. Vascular/Lymphatic: No pathologically enlarged lymph nodes. No acute vascular findings. Congenital left-sided IVC again noted. Reproductive:  No mass or other significant abnormality. Other:  None. Musculoskeletal: Large decubitus ulcer is seen along  the right posterior aspect of the lower sacrum. No definite evidence of sacral osteomyelitis or other suspicious bone lesions. IMPRESSION: Mild diffuse colitis, consistent with known diagnosis of ulcerative colitis. No evidence of bowel obstruction, perforation, or abscess. Large decubitus ulcer along right posterior aspect of lower sacrum. No definite evidence of sacral osteomyelitis. Tiny left pleural effusion and mild dependent atelectasis. Electronically Signed   By: Danae Orleans M.D.   On: 10/09/2023 15:12   DG Chest Port 1 View  Result Date: 10/09/2023 CLINICAL DATA:  Sepsis. EXAM: PORTABLE CHEST 1 VIEW COMPARISON:  Chest radiograph dated August 04, 2023 FINDINGS: The heart size and mediastinal contours are within normal limits. Both lungs are clear. No pneumothorax or sizable pleural effusion. No acute osseous abnormality. IMPRESSION: No acute cardiopulmonary findings. Electronically Signed   By: Hart Robinsons M.D.   On: 10/09/2023 14:37    Labs on Admission: I have personally reviewed following labs CBC: Recent Labs  Lab 10/09/23 1144  WBC 13.6*  NEUTROABS 12.2*  HGB 8.8*  HCT 27.1*  MCV 81.1  PLT 376   Basic Metabolic Panel: Recent Labs  Lab 10/09/23 1144  NA 135  K 3.3*  CL 95*  CO2 30  GLUCOSE 90  BUN 24*  CREATININE 0.84  CALCIUM 8.3*   GFR: Estimated Creatinine Clearance: 77.9 mL/min (by C-G formula based on SCr of 0.84 mg/dL).  Liver Function Tests: Recent Labs  Lab 10/09/23 1144  AST 38  ALT 18  ALKPHOS 84  BILITOT 0.9  PROT 5.5*  ALBUMIN 1.7*   Coagulation Profile: Recent Labs  Lab 10/09/23 1144  INR 1.5*   Urine analysis:    Component Value Date/Time   COLORURINE AMBER (A) 08/04/2023 1831   APPEARANCEUR CLOUDY (A) 08/04/2023 1831   APPEARANCEUR Turbid 08/20/2014 1544   LABSPEC 1.023 08/04/2023 1831   LABSPEC 1.027 08/20/2014 1544   PHURINE 5.0 08/04/2023 1831   GLUCOSEU NEGATIVE 08/04/2023 1831   GLUCOSEU 50 mg/dL 14/78/2956 2130    HGBUR LARGE (A) 08/04/2023 1831   BILIRUBINUR NEGATIVE 08/04/2023 1831   BILIRUBINUR Negative 08/20/2014 1544   KETONESUR 5 (A) 08/04/2023 1831   PROTEINUR 100 (A) 08/04/2023 1831   NITRITE NEGATIVE 08/04/2023 1831   LEUKOCYTESUR NEGATIVE 08/04/2023 1831   LEUKOCYTESUR Negative 08/20/2014 1544   CRITICAL CARE Performed by: Dr. Sedalia Muta  Total critical care time: 32 minutes  Critical care time was exclusive of separately billable procedures and treating other patients.  Critical care was necessary to treat or prevent imminent or life-threatening deterioration.  Critical care was time spent personally by me on the following activities: development of treatment plan with patient and/or surrogate as well as nursing, discussions with consultants, evaluation of patient's response to treatment, examination of patient, obtaining history from patient or surrogate, ordering and performing treatments and interventions, ordering and review of laboratory studies, ordering and review of radiographic studies, pulse oximetry and re-evaluation of patient's condition.  This document was prepared using Dragon Voice Recognition software and may include unintentional dictation errors.  Dr. Sedalia Muta [facility]  If 7PM-7AM, please contact overnight-coverage provider If 7AM-7PM, please contact day attending provider [email or web address]  10/09/2023, 4:41 PM

## 2023-10-09 NOTE — Assessment & Plan Note (Signed)
Potassium chloride 20 mill equivalent p.o. one-time dose ordered Check magnesium on admission

## 2023-10-09 NOTE — Hospital Course (Signed)
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  Vitals in the ED showed temperature of 99, respiration rate 25, heart rate of 106, blood pressure 113/68, SpO2 of 99% on room air.  Serum sodium is 135, potassium 3.3, chloride 95, bicarb 30, BUN of 25, serum creatinine of 0.84, EGFR greater than 60, nonfasting blood glucose 90, WBC 13.6, hemoglobin 8.8, platelets of 376.  Lactic acid was 2.6.  ED treatment: Levofloxacin 750 mg IV one-time dose, vancomycin, sodium chloride 2 L bolus.

## 2023-10-09 NOTE — Consult Note (Signed)
Patient ID: [name], male   DOB: 1961/09/27, 62 y.o.   MRN: [id number] CC: Sacral Decubitus Ulcer History of Present Illness [name] is a 62 y.o. male with medical comorbidities including a DVT in his IJ on Eliquis and PE and right lower lobe who was brought from his facility due to foul-smelling sacral wounds.  While the patient is alert and oriented to person, place and time he is unable to explain much about his sacral decubitus ulcer.  He says that he has had this wound for some time and it was due to him falling.  Per chart review he has not been doing physical therapy at his SNF.  He does report that he can feel pain in his wound.  Past Medical History Past Medical History:  Diagnosis Date   Dysphagia    Hyperlipidemia    Hypertension    Pulmonary air embolism (HCC)    Stroke (HCC)    Ulcerative colitis (HCC)        Past Surgical History:  Procedure Laterality Date   COLONOSCOPY WITH PROPOFOL N/A 02/08/2016   Procedure: COLONOSCOPY WITH PROPOFOL;  Surgeon: Wallace Cullens, MD;  Location: [facility] ENDOSCOPY;  Service: Gastroenterology;  Laterality: N/A;   ESOPHAGOGASTRODUODENOSCOPY (EGD) WITH PROPOFOL N/A 02/08/2016   Procedure: ESOPHAGOGASTRODUODENOSCOPY (EGD) WITH PROPOFOL;  Surgeon: Wallace Cullens, MD;  Location: [facility] ENDOSCOPY;  Service: Gastroenterology;  Laterality: N/A;   NO PAST SURGERIES      Allergies  Allergen Reactions   Penicillins Anaphylaxis    Has patient had a PCN reaction causing immediate rash, facial/tongue/throat swelling, SOB or lightheadedness with hypotension: Yes Has patient had a PCN reaction causing severe rash involving mucus membranes or skin necrosis: No Has patient had a PCN reaction that required hospitalization No Has patient had a PCN reaction occurring within the last 10 years: No If all of the above answers are "NO", then may proceed with Cephalosporin use.   Cephalosporins Other (See Comments)    Reaction:  Unknown     Current  Facility-Administered Medications  Medication Dose Route Frequency Provider Last Rate Last Admin   (feeding supplement) PROSource Plus liquid 30 mL  30 mL Oral BID BM Cox, Amy N, DO       0.9 %  sodium chloride infusion   Intravenous Once Cox, Amy N, DO       acetaminophen (TYLENOL) tablet 650 mg  650 mg Oral Q6H PRN Cox, Amy N, DO       Or   acetaminophen (TYLENOL) suppository 650 mg  650 mg Rectal Q6H PRN Cox, Amy N, DO       ascorbic acid (VITAMIN C) tablet 500 mg  500 mg Oral BID Cox, Amy N, DO       [START ON 10/10/2023] cholecalciferol (VITAMIN D3) tablet 5,000 Units  5,000 Units Oral Daily Cox, Amy N, DO       fentaNYL (SUBLIMAZE) injection 25 mcg  25 mcg Intravenous Q4H PRN Cox, Amy N, DO       heparin ADULT infusion 100 units/mL (25000 units/279mL)  1,500 Units/hr Intravenous Continuous Lowella Bandy, RPH 15 mL/hr at 10/09/23 1620 1,500 Units/hr at 10/09/23 1620   hydrALAZINE (APRESOLINE) injection 5 mg  5 mg Intravenous Q6H PRN Cox, Amy N, DO       lactated ringers infusion   Intravenous Continuous Cox, Amy N, DO 125 mL/hr at 10/09/23 1616 New Bag at 10/09/23 1616   [START ON 10/10/2023] levofloxacin (LEVAQUIN) IVPB 750 mg  750 mg Intravenous Q24H Cox, Amy N, DO       morphine (PF) 2 MG/ML injection 2 mg  2 mg Intravenous Q4H PRN Cox, Amy N, DO       [START ON 10/10/2023] multivitamin with minerals tablet 1 tablet  1 tablet Oral Daily Cox, Amy N, DO       ondansetron (ZOFRAN) tablet 4 mg  4 mg Oral Q6H PRN Cox, Amy N, DO       Or   ondansetron (ZOFRAN) injection 4 mg  4 mg Intravenous Q6H PRN Cox, Amy N, DO       pantoprazole (PROTONIX) injection 80 mg  80 mg Intravenous BID Cox, Amy N, DO       senna-docusate (Senokot-S) tablet 1 tablet  1 tablet Oral QHS PRN Cox, Amy N, DO       [START ON 10/10/2023] thiamine (VITAMIN B1) tablet 100 mg  100 mg Oral Daily Cox, Amy N, DO       traZODone (DESYREL) tablet 100 mg  100 mg Oral QHS PRN Cox, Amy N, DO       [START ON 10/10/2023] vancomycin  (VANCOREADY) IVPB 750 mg/150 mL  750 mg Intravenous Q12H Cox, Amy N, DO       [START ON 10/10/2023] zinc sulfate capsule 220 mg  220 mg Oral Daily Cox, Amy N, DO       Current Outpatient Medications  Medication Sig Dispense Refill   apixaban (ELIQUIS) 5 MG TABS tablet Take 2 tablets (10 mg total) by mouth 2 (two) times daily for 6 days, THEN 1 tablet (5 mg total) 2 (two) times daily. (Patient taking differently: One tablet two times a day) 60 tablet 2   ascorbic acid (VITAMIN C) 500 MG tablet Take 1 tablet (500 mg total) by mouth 2 (two) times daily. 30 tablet 2   balsalazide (COLAZAL) 750 MG capsule Take 750 mg by mouth 3 (three) times daily.     Cholecalciferol (VITAMIN D3) 5000 UNITS TABS Take 5,000 Units by mouth daily.     clopidogrel (PLAVIX) 75 MG tablet Take 75 mg by mouth daily.     folic acid (FOLVITE) 1 MG tablet Take 1 tablet (1 mg total) by mouth daily. 30 tablet 2   metoprolol succinate (TOPROL-XL) 25 MG 24 hr tablet Take 25 mg by mouth daily.     Multiple Vitamin (MULTIVITAMIN WITH MINERALS) TABS tablet Take 1 tablet by mouth daily. 30 tablet 2   Nutritional Supplements (,FEEDING SUPPLEMENT, PROSOURCE PLUS) liquid Take 30 mLs by mouth 2 (two) times daily between meals. 887 mL 2   thiamine (VITAMIN B-1) 100 MG tablet Take 1 tablet (100 mg total) by mouth daily. 30 tablet 2   traZODone (DESYREL) 100 MG tablet Take 100 mg by mouth at bedtime as needed for sleep.     Zinc Sulfate 220 (50 Zn) MG TABS Take 1 tablet (220 mg total) by mouth daily. 30 tablet 2   collagenase (SANTYL) 250 UNIT/GM ointment Apply 1 application topically daily. (Patient not taking: Reported on 10/09/2023) 30 g 0   mupirocin ointment (BACTROBAN) 2 % Apply 1 application topically 2 (two) times daily. (Patient not taking: Reported on 10/09/2023) 22 g 0    Family History Family History  Problem Relation Age of Onset   Breast cancer Mother    Diabetes Father    Hypertension Father    Breast cancer Sister         Social History Social History   Tobacco  Use   Smoking status: Never   Smokeless tobacco: Never  Substance Use Topics   Alcohol use: Yes   Drug use: Yes    Types: Marijuana        ROS Full ROS of systems performed and is otherwise negative there than what is stated in the HPI  Physical Exam Blood pressure 90/64, pulse (!) 111, temperature 97.8 F (36.6 C), temperature source Oral, resp. rate 19, height 5\' 10"  (1.778 m), weight 59.6 kg, SpO2 100%.  Patient appears much older than stated age.  He is tachycardic.  On exam he has a large sacral decubitus ulcer with frankly necrotic tissue surrounding its base.  There is some areas of bleeding that would suggest there is healthy tissue at its base but the patient did not tolerate bedside exam very well.  I can feel his bone at the base of the wound.  At the dependent part of the wound there is soupy, necrotic brown-gray tissue.  Data Reviewed I reviewed his CT and there is evidence of a large sacral decubitus ulcer.  His labs are significant for a sodium of 135, his creatinine is within normal limits.  He does have a low albumin.  He is also anemic to 8.8 although he has cleared his lactic acidosis.  He is currently on heparin therapy to replace his Eliquis.  I have personally reviewed the patient's imaging and medical records.    Assessment    62 year old with multiple medical core mobilities including a PE and DVT who is currently on anticoagulation therapy who presents from his facility with a sacral decubitus ulcer.  On exam there is obvious necrotic tissue.  Plan    Will plan for debridement tomorrow.  I do not think he will be able to tolerate bedside debridement and given his anticoagulation status I think it is more prudent to do this within the operating room.  Please make patient NPO.  He is anemic so we will hold 2 units; if he has worsening anemia tomorrow we will give him a unit preoperatively and then can transfuse  intraoperatively if needed.  We will also hold his heparin drip 4 hours prior to the procedure so approximately 8 AM.   Kandis Cocking 10/09/2023, 4:53 PM

## 2023-10-09 NOTE — Assessment & Plan Note (Signed)
Home vitamin C 500 mg p.o. twice daily, multivitamin, thiamine 100 mg daily, vitamin D 5000 units daily, zinc sulfate 220 mg p.o. daily were resumed on admission Home feeding supplement 2 times daily between meals were resumed on admission

## 2023-10-09 NOTE — ED Triage Notes (Signed)
EMS was called out to [facility] for sacral wound with foul odor, no improvement after oral antibiotics.   HR 107 Temp 99.1 BP  97/68-106/67 O2 100% CO2 22-24

## 2023-10-09 NOTE — Assessment & Plan Note (Signed)
History of GI bleeding secondary to ulcerative colitis

## 2023-10-09 NOTE — Progress Notes (Signed)
PHARMACY -  BRIEF ANTIBIOTIC NOTE   Pharmacy has received consult(s) for vancomycin, levofloxacin from an ED provider.  The patient's profile has been reviewed for ht/wt/allergies/indication/available labs.    One time order(s) placed for   1) levofloxacin 750 mg IV x 1  2) vancomycin 1000 mg IV x 1  Further antibiotics/pharmacy consults should be ordered by admitting physician if indicated.                       Thank you, Lowella Bandy 10/09/2023  11:50 AM

## 2023-10-09 NOTE — Assessment & Plan Note (Addendum)
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

## 2023-10-10 ENCOUNTER — Encounter: Payer: Self-pay | Admitting: Internal Medicine

## 2023-10-10 ENCOUNTER — Inpatient Hospital Stay: Payer: 59 | Admitting: Certified Registered"

## 2023-10-10 ENCOUNTER — Other Ambulatory Visit: Payer: Self-pay

## 2023-10-10 ENCOUNTER — Encounter
Admission: EM | Disposition: A | Discharge status: Skilled Nursing Facility | Payer: Self-pay | Source: Skilled Nursing Facility | Attending: Internal Medicine

## 2023-10-10 DIAGNOSIS — E782 Mixed hyperlipidemia: Secondary | ICD-10-CM

## 2023-10-10 DIAGNOSIS — K51918 Ulcerative colitis, unspecified with other complication: Secondary | ICD-10-CM

## 2023-10-10 DIAGNOSIS — M4628 Osteomyelitis of vertebra, sacral and sacrococcygeal region: Secondary | ICD-10-CM | POA: Diagnosis not present

## 2023-10-10 DIAGNOSIS — A419 Sepsis, unspecified organism: Secondary | ICD-10-CM

## 2023-10-10 DIAGNOSIS — E43 Unspecified severe protein-calorie malnutrition: Secondary | ICD-10-CM | POA: Diagnosis not present

## 2023-10-10 DIAGNOSIS — R6520 Severe sepsis without septic shock: Secondary | ICD-10-CM

## 2023-10-10 DIAGNOSIS — L89150 Pressure ulcer of sacral region, unstageable: Secondary | ICD-10-CM | POA: Diagnosis not present

## 2023-10-10 DIAGNOSIS — D62 Acute posthemorrhagic anemia: Secondary | ICD-10-CM

## 2023-10-10 HISTORY — PX: INCISION AND DRAINAGE OF WOUND: SHX1803

## 2023-10-10 LAB — BLOOD CULTURE ID PANEL (REFLEXED) - BCID2

## 2023-10-10 LAB — CBC
HCT: 20.9 % — ABNORMAL LOW (ref 39.0–52.0)
Hemoglobin: 6.6 g/dL — ABNORMAL LOW (ref 13.0–17.0)
MCH: 25.9 pg — ABNORMAL LOW (ref 26.0–34.0)
MCHC: 31.6 g/dL (ref 30.0–36.0)
MCV: 82 fL (ref 80.0–100.0)
Platelets: 262 10*3/uL (ref 150–400)
RBC: 2.55 MIL/uL — ABNORMAL LOW (ref 4.22–5.81)
RDW: 14.4 % (ref 11.5–15.5)
WBC: 9.6 10*3/uL (ref 4.0–10.5)
nRBC: 0 % (ref 0.0–0.2)

## 2023-10-10 LAB — MAGNESIUM
Magnesium: 1.3 mg/dL — ABNORMAL LOW (ref 1.7–2.4)
Magnesium: 1.7 mg/dL (ref 1.7–2.4)

## 2023-10-10 LAB — I-STAT CHEM 8, ED
BUN: 15 mg/dL (ref 8–23)
Calcium, Ion: 1.11 mmol/L — ABNORMAL LOW (ref 1.15–1.40)
Chloride: 99 mmol/L (ref 98–111)
Creatinine, Ser: 0.9 mg/dL (ref 0.61–1.24)
Glucose, Bld: 88 mg/dL (ref 70–99)
HCT: 23 % — ABNORMAL LOW (ref 39.0–52.0)
Hemoglobin: 7.8 g/dL — ABNORMAL LOW (ref 13.0–17.0)
Potassium: 4.1 mmol/L (ref 3.5–5.1)
Sodium: 131 mmol/L — ABNORMAL LOW (ref 135–145)
TCO2: 26 mmol/L (ref 22–32)

## 2023-10-10 LAB — BASIC METABOLIC PANEL
Anion gap: 4 — ABNORMAL LOW (ref 5–15)
BUN: 20 mg/dL (ref 8–23)
CO2: 28 mmol/L (ref 22–32)
Calcium: 7.4 mg/dL — ABNORMAL LOW (ref 8.9–10.3)
Chloride: 100 mmol/L (ref 98–111)
Creatinine, Ser: 0.82 mg/dL (ref 0.61–1.24)
GFR, Estimated: >60 mL/min (ref >60)
Glucose, Bld: 97 mg/dL (ref 70–99)
Potassium: 2.8 mmol/L — ABNORMAL LOW (ref 3.5–5.1)
Sodium: 132 mmol/L — ABNORMAL LOW (ref 135–145)

## 2023-10-10 LAB — APTT: aPTT: >200 s (ref 24–36)

## 2023-10-10 LAB — PREPARE RBC (CROSSMATCH)

## 2023-10-10 LAB — MRSA NEXT GEN BY PCR, NASAL: MRSA by PCR Next Gen: NOT DETECTED

## 2023-10-10 SURGERY — IRRIGATION AND DEBRIDEMENT WOUND
Anesthesia: General | Site: Back

## 2023-10-10 MED ORDER — MAGNESIUM SULFATE 2 GM/50ML IV SOLN
2.0000 g | Freq: Once | INTRAVENOUS | Status: AC
  Administered 2023-10-10: 2 g via INTRAVENOUS
  Filled 2023-10-10: qty 50

## 2023-10-10 MED ORDER — HEPARIN (PORCINE) 25000 UT/250ML-% IV SOLN
1200.0000 [IU]/h | INTRAVENOUS | Status: DC
  Administered 2023-10-10: 1300 [IU]/h via INTRAVENOUS
  Filled 2023-10-10 (×2): qty 250

## 2023-10-10 MED ORDER — FENTANYL CITRATE (PF) 100 MCG/2ML IJ SOLN: 25.0000 ug | INTRAMUSCULAR | Status: DC | PRN

## 2023-10-10 MED ORDER — VANCOMYCIN HCL IN DEXTROSE 1-5 GM/200ML-% IV SOLN
INTRAVENOUS | Status: AC
  Filled 2023-10-10: qty 200

## 2023-10-10 MED ORDER — FENTANYL CITRATE (PF) 100 MCG/2ML IJ SOLN
INTRAMUSCULAR | Status: DC | PRN
  Administered 2023-10-10: 50 ug via INTRAVENOUS

## 2023-10-10 MED ORDER — FENTANYL CITRATE (PF) 100 MCG/2ML IJ SOLN
INTRAMUSCULAR | Status: AC
  Filled 2023-10-10: qty 2

## 2023-10-10 MED ORDER — POTASSIUM CHLORIDE CRYS ER 20 MEQ PO TBCR
40.0000 meq | EXTENDED_RELEASE_TABLET | Freq: Once | ORAL | Status: AC
  Administered 2023-10-10: 40 meq via ORAL
  Filled 2023-10-10: qty 2

## 2023-10-10 MED ORDER — SODIUM CHLORIDE 0.9 % IV BOLUS: Freq: Once | INTRAVENOUS | Status: DC

## 2023-10-10 MED ORDER — 0.9 % SODIUM CHLORIDE (POUR BTL) OPTIME
TOPICAL | Status: DC | PRN
  Administered 2023-10-10: 1000 mL

## 2023-10-10 MED ORDER — MAGNESIUM SULFATE 4 GM/100ML IV SOLN
4.0000 g | Freq: Once | INTRAVENOUS | Status: AC
  Administered 2023-10-10: 4 g via INTRAVENOUS
  Filled 2023-10-10: qty 100

## 2023-10-10 MED ORDER — HEPARIN (PORCINE) 25000 UT/250ML-% IV SOLN
1300.0000 [IU]/h | INTRAVENOUS | Status: AC
  Filled 2023-10-10 (×2): qty 250

## 2023-10-10 MED ORDER — PHENYLEPHRINE 80 MCG/ML (10ML) SYRINGE FOR IV PUSH (FOR BLOOD PRESSURE SUPPORT)
PREFILLED_SYRINGE | INTRAVENOUS | Status: DC | PRN
  Administered 2023-10-10 (×4): 160 ug via INTRAVENOUS

## 2023-10-10 MED ORDER — ROCURONIUM BROMIDE 100 MG/10ML IV SOLN
INTRAVENOUS | Status: DC | PRN
  Administered 2023-10-10: 50 mg via INTRAVENOUS

## 2023-10-10 MED ORDER — DROPERIDOL 2.5 MG/ML IJ SOLN: 0.6250 mg | Freq: Once | INTRAMUSCULAR | Status: DC | PRN

## 2023-10-10 MED ORDER — SODIUM CHLORIDE 0.9 % IV SOLN
1.0000 g | Freq: Three times a day (TID) | INTRAVENOUS | Status: DC
Start: 2023-10-10 — End: 2023-10-15
  Administered 2023-10-10 – 2023-10-15 (×14): 1 g via INTRAVENOUS
  Filled 2023-10-10 (×16): qty 20

## 2023-10-10 MED ORDER — SUGAMMADEX SODIUM 200 MG/2ML IV SOLN
INTRAVENOUS | Status: DC | PRN
  Administered 2023-10-10: 200 mg via INTRAVENOUS

## 2023-10-10 MED ORDER — BUPIVACAINE HCL (PF) 0.5 % IJ SOLN
INTRAMUSCULAR | Status: AC
  Filled 2023-10-10: qty 30

## 2023-10-10 MED ORDER — MIDAZOLAM HCL 2 MG/2ML IJ SOLN
INTRAMUSCULAR | Status: AC
  Filled 2023-10-10: qty 2

## 2023-10-10 MED ORDER — PROPOFOL 10 MG/ML IV BOLUS
INTRAVENOUS | Status: DC | PRN
  Administered 2023-10-10: 50 mg via INTRAVENOUS

## 2023-10-10 MED ORDER — MIDAZOLAM HCL 2 MG/2ML IJ SOLN
INTRAMUSCULAR | Status: DC | PRN
  Administered 2023-10-10 (×2): 1 mg via INTRAVENOUS

## 2023-10-10 MED ORDER — DEXAMETHASONE SODIUM PHOSPHATE 10 MG/ML IJ SOLN
INTRAMUSCULAR | Status: DC | PRN
Start: 2023-10-10 — End: 2023-10-10
  Administered 2023-10-10: 10 mg via INTRAVENOUS

## 2023-10-10 MED ORDER — POTASSIUM CHLORIDE 10 MEQ/100ML IV SOLN
10.0000 meq | INTRAVENOUS | Status: AC
  Administered 2023-10-10 (×3): 10 meq via INTRAVENOUS
  Filled 2023-10-10 (×2): qty 100

## 2023-10-10 MED ORDER — LIDOCAINE HCL (CARDIAC) PF 100 MG/5ML IV SOSY
PREFILLED_SYRINGE | INTRAVENOUS | Status: DC | PRN
Start: 2023-10-10 — End: 2023-10-10
  Administered 2023-10-10: 60 mg via INTRAVENOUS

## 2023-10-10 SURGICAL SUPPLY — 38 items
BLADE SURG 15 STRL LF DISP TIS (BLADE) ×1 IMPLANT
BLADE SURG 15 STRL SS (BLADE) ×1
BNDG GAUZE DERMACEA FLUFF 4 (GAUZE/BANDAGES/DRESSINGS) IMPLANT
BNDG GZE DERMACEA 4 6PLY (GAUZE/BANDAGES/DRESSINGS)
BRIEF MESH DISP 2XL (UNDERPADS AND DIAPERS) ×1 IMPLANT
DRAIN PENROSE 0.625X18 (DRAIN) IMPLANT
DRAIN PENROSE 12X.25 LTX STRL (MISCELLANEOUS) IMPLANT
DRAPE LAPAROTOMY 77X122 PED (DRAPES) ×1 IMPLANT
DRAPE PERI LITHO V/GYN (MISCELLANEOUS) ×1 IMPLANT
DRSG GAUZE FLUFF 36X18 (GAUZE/BANDAGES/DRESSINGS) ×1 IMPLANT
ELECT REM PT RETURN 9FT ADLT (ELECTROSURGICAL) ×1
ELECTRODE REM PT RTRN 9FT ADLT (ELECTROSURGICAL) ×1 IMPLANT
GAUZE 4X4 16PLY ~~LOC~~+RFID DBL (SPONGE) IMPLANT
GAUZE SPONGE 4X4 12PLY STRL (GAUZE/BANDAGES/DRESSINGS) IMPLANT
GLOVE BIOGEL PI IND STRL 7.0 (GLOVE) ×1 IMPLANT
GLOVE SURG SYN 6.5 ES PF (GLOVE) ×1 IMPLANT
GLOVE SURG SYN 6.5 PF PI (GLOVE) ×1 IMPLANT
KIT TURNOVER CYSTO (KITS) ×1 IMPLANT
LABEL OR SOLS (LABEL) ×1 IMPLANT
MANIFOLD NEPTUNE II (INSTRUMENTS) ×1 IMPLANT
NDL HYPO 22X1.5 SAFETY MO (MISCELLANEOUS) ×1 IMPLANT
NDL SAFETY ECLIPSE 18X1.5 (NEEDLE) ×1 IMPLANT
NEEDLE HYPO 22X1.5 SAFETY MO (MISCELLANEOUS) ×1 IMPLANT
NS IRRIG 500ML POUR BTL (IV SOLUTION) ×1 IMPLANT
PACK BASIN MINOR ARMC (MISCELLANEOUS) ×1 IMPLANT
PACKING GAUZE IODOFORM 1INX5YD (GAUZE/BANDAGES/DRESSINGS) ×1 IMPLANT
PAD ABD DERMACEA PRESS 5X9 (GAUZE/BANDAGES/DRESSINGS) ×2 IMPLANT
PAD PREP OB/GYN DISP 24X41 (PERSONAL CARE ITEMS) ×1 IMPLANT
SOL PREP PVP 2OZ (MISCELLANEOUS) ×1
SOLUTION PREP PVP 2OZ (MISCELLANEOUS) ×1 IMPLANT
SPONGE T-LAP 18X18 ~~LOC~~+RFID (SPONGE) IMPLANT
SURGILUBE 2OZ TUBE FLIPTOP (MISCELLANEOUS) IMPLANT
SUT CHROMIC 3 0 SH 27 (SUTURE) IMPLANT
SYR 10ML LL (SYRINGE) ×2 IMPLANT
SYR BULB IRRIG 60ML STRL (SYRINGE) IMPLANT
TOWEL OR 17X26 4PK STRL BLUE (TOWEL DISPOSABLE) ×1 IMPLANT
TRAP FLUID SMOKE EVACUATOR (MISCELLANEOUS) ×1 IMPLANT
WATER STERILE IRR 500ML POUR (IV SOLUTION) ×1 IMPLANT

## 2023-10-10 NOTE — Progress Notes (Signed)
Spoke with Nadine Counts at [facility], states they midline, was placed on 09/10/23. Dr. Allena Katz aware.

## 2023-10-10 NOTE — Op Note (Signed)
PATIENT:  [name]  62 y.o. male   PRE-OPERATIVE DIAGNOSIS:  SACRAL DECUBITUS ULSCER   POST-OPERATIVE DIAGNOSIS:  SACRAL DECUBITUS ULSCER   PROCEDURE:  Procedure(s): IRRIGATION AND DEBRIDEMENT SACAL WOUND (N/A)   Excisional debridement of skin, subcutaneous tissue, fascia and bone measuring 15 x 16 x 3cm   SURGEON:  Surgeons and Role:    * Kandis Cocking, MD - Primary   PHYSICIAN ASSISTANT:    ASSISTANTS: none    ANESTHESIA:   general   EBL:  100ccs    BLOOD ADMINISTERED:2uPRBC   DRAINS: none    LOCAL MEDICATIONS USED:  NONE   SPECIMEN:  Source of Specimen:  bone biopsy   DISPOSITION OF SPECIMEN:  PATHOLOGY   COUNTS:  YES   TOURNIQUET:  * No tourniquets in log *   DICTATION: .Dragon Dictation   PLAN OF CARE: Admit to inpatient    PATIENT DISPOSITION:  PACU - hemodynamically stable.   Delay start of Pharmacological VTE agent (>24hrs) due to surgical blood loss or risk of bleeding: Can restart heparin gtt WITHOUT BOLUS in 8 hours  After informed consent was obtained the patient was brought to the operating room.  He was intubated on the hospital gurney and then placed prone on the operating room table.  His sacrum and buttocks was then prepped and draped in the usual standard fashion.  A surgical timeout was called identifying correct patient, site, side and procedure.  In the central part of the wound there was minimal amount of necrotic overlying fascia.  This was sharply debrided with scissors down to his sacrum.  I did debride some of his sacrum and the bone was soft.  The bone was then grasped with a rongeur and specimen was taken for bone biopsy.  The skin of the surrounding wound was necrotic at the superior lateral parts of the wound.  The skin was then debrided back sharply with scissors.  The underlying subcutaneous tissue including muscle and fascia was also debrided back mostly in the superior lateral edges of the wound.  All necrotic tissue was removed  with a combination of scissors, cautery and curettes.  The necrotic tissue was debrided back to healthy, bleeding tissue.  The total measurements of the final wound was 15 cm x 16 cm x 3 cm.  Hemostasis was then obtained.  The wound was irrigated with warm saline solution.  The wound was then packed with Dakin's soaked Kerlix.  A total of 2 Kerlix wraps were placed on the wound bed.  This was dressed with a dry Kerlix and ABD pads and tape.  The patient was then awoken from anesthesia and taken the PACU in good condition.  Prior to termination of the procedure all sponge instrument counts were correct x 2.

## 2023-10-10 NOTE — Anesthesia Preprocedure Evaluation (Signed)
Anesthesia Evaluation  Patient identified by MRN, date of birth, ID band Patient awake    Reviewed: Allergy & Precautions, NPO status , Patient's Chart, lab work & pertinent test results, reviewed documented beta blocker date and time   History of Anesthesia Complications Negative for: history of anesthetic complications  Airway Mallampati: II       Dental  (+) Poor Dentition, Chipped, Dental Advidsory Given   Pulmonary neg pulmonary ROS   Pulmonary exam normal breath sounds clear to auscultation       Cardiovascular hypertension, Pt. on medications and Pt. on home beta blockers (-) angina (-) Past MI and (-) Cardiac Stents Normal cardiovascular exam(-) dysrhythmias (-) Valvular Problems/Murmurs     Neuro/Psych neg Seizures  Neuromuscular disease CVA, Residual Symptoms  negative psych ROS   GI/Hepatic Neg liver ROS, PUD,,,  Endo/Other  negative endocrine ROS    Renal/GU negative Renal ROS  negative genitourinary   Musculoskeletal negative musculoskeletal ROS (+)    Abdominal Normal abdominal exam  (+)   Peds negative pediatric ROS (+)  Hematology  (+) Blood dyscrasia, anemia   Anesthesia Other Findings Past Medical History: No date: Dysphagia No date: Hyperlipidemia No date: Hypertension No date: Pulmonary air embolism (HCC) No date: Stroke (HCC) No date: Ulcerative colitis (HCC)   Reproductive/Obstetrics                             Anesthesia Physical Anesthesia Plan  ASA: 3  Anesthesia Plan: General   Post-op Pain Management:    Induction: Intravenous  PONV Risk Score and Plan: 2 and Ondansetron, Dexamethasone and Treatment may vary due to age or medical condition  Airway Management Planned: Oral ETT  Additional Equipment:   Intra-op Plan:   Post-operative Plan: Extubation in OR  Informed Consent: I have reviewed the patients History and Physical, chart, labs and  discussed the procedure including the risks, benefits and alternatives for the proposed anesthesia with the patient or authorized representative who has indicated his/her understanding and acceptance.     Dental advisory given  Plan Discussed with: CRNA and Surgeon  Anesthesia Plan Comments:         Anesthesia Quick Evaluation

## 2023-10-10 NOTE — Consult Note (Signed)
Pharmacy Antibiotic Note  [name] is a 62 y.o. male admitted on 10/09/2023 with  Sacral osteomyelitis . Patient admitted for sacral wound with foul odor with no improvements while on out patient abx (unknown).  Pharmacy has been consulted for Vancomycin and levofloxacin dosing.  Today, 10/10/2023 Day 2 of antibiotics Scr 0.82 (baseline) WBC 9.6 >> 13.6 10/31 Bcx. 1/4 MRSE I&D 11/1  Plan: Continue vancomycin 750 mg BID Continue levofloxacin 750 mg Q24H Follow cultures  Continue to monitor renal function   Height: 5\' 10"  (177.8 cm) Weight: 59.6 kg (131 lb 6.3 oz) IBW/kg (Calculated) : 73  Temp (24hrs), Avg:98.4 F (36.9 C), Min:97.8 F (36.6 C), Max:99 F (37.2 C)  Recent Labs  Lab 10/09/23 1144 10/09/23 1516 10/10/23 0050  WBC 13.6*  --  9.6  CREATININE 0.84  --  0.82  LATICACIDVEN 2.6* 1.6  --     Estimated Creatinine Clearance: 79.7 mL/min (by C-G formula based on SCr of 0.82 mg/dL).    Allergies  Allergen Reactions   Penicillins Anaphylaxis    Has patient had a PCN reaction causing immediate rash, facial/tongue/throat swelling, SOB or lightheadedness with hypotension: Yes Has patient had a PCN reaction causing severe rash involving mucus membranes or skin necrosis: No Has patient had a PCN reaction that required hospitalization No Has patient had a PCN reaction occurring within the last 10 years: No If all of the above answers are "NO", then may proceed with Cephalosporin use.   Cephalosporins Other (See Comments)    Reaction:  Unknown     Antimicrobials this admission: 10/31 levofloxacin 750 mg IV Q24H >>  10/31 Vancomycin 750 mg Q12H >>   Dose adjustments this admission: NA  Microbiology results: 10/31 BCx: MRSE 1/4  Thank you for allowing pharmacy to be a part of this patient's care.  Effie Shy, PharmD Pharmacy Resident  10/10/2023 10:41 AM

## 2023-10-10 NOTE — Consult Note (Signed)
NAME: [name]  DOB: 02-Sep-1961  MRN: [id number]  Date/Time: 10/10/2023 4:09 PM  REQUESTING PROVIDER: Dr.PAtel Subjective:  REASON FOR CONSULT: sacral decubitus ? [name] is a 62 y.o. with a history of CVA, Ulcerative colitis,HTN, , h/o internal jugular DVT and PEhyperlipidemia,presents from [facility] with foul smelling discharge from sacral wound. Pt has been in SNF for the past 3 months- he has not been mobile and has not been participating in PT- prior to going to SNF he was at home mobile and independent  10/09/23  BP 94/70  Temp 97.8 F (36.6 C)  Pulse Rate 98  Resp 18  SpO2 100 %    Latest Reference Range & Units 10/09/23  WBC 4.0 - 10.5 K/uL 13.6 (H)  Hemoglobin 13.0 - 17.0 g/dL 8.8 (L)  HCT 10.2 - 72.5 % 27.1 (L)  Platelets 150 - 400 K/uL 376  Creatinine 0.61 - 1.24 mg/dL 3.66  HE had a sacral decubitus and blood cultures were sent.  He had a CT abdomen and pelvis that showed large decubitus ulcer on the right posterior aspect of the lower sacrum.  There was no definite evidence of sacral osteomyelitis or other suspicious bone lesions.  Mild diffuse colitis consistent with known diagnosis of ulcerative colitis was seen.  Patient was started on Levaquin and vancomycin because of allergy to penicillin and questionable allergy to cats cephalosporin. He was seen by surgeon and taken for debridement. Wound culture was sent I am asked to see the patient for the same  Past Medical History:  Diagnosis Date   Dysphagia    Hyperlipidemia    Hypertension    Pulmonary air embolism (HCC)    Stroke (HCC)    Ulcerative colitis (HCC)     Past Surgical History:  Procedure Laterality Date   COLONOSCOPY WITH PROPOFOL N/A 02/08/2016   Procedure: COLONOSCOPY WITH PROPOFOL;  Surgeon: Wallace Cullens, MD;  Location: [facility] ENDOSCOPY;  Service: Gastroenterology;  Laterality: N/A;   ESOPHAGOGASTRODUODENOSCOPY (EGD) WITH PROPOFOL N/A 02/08/2016   Procedure: ESOPHAGOGASTRODUODENOSCOPY (EGD) WITH  PROPOFOL;  Surgeon: Wallace Cullens, MD;  Location: [facility] ENDOSCOPY;  Service: Gastroenterology;  Laterality: N/A;   NO PAST SURGERIES      Social History   Socioeconomic History   Marital status: Single    Spouse name: Not on file   Number of children: Not on file   Years of education: Not on file   Highest education level: Not on file  Occupational History   Not on file  Tobacco Use   Smoking status: Never   Smokeless tobacco: Never  Substance and Sexual Activity   Alcohol use: Yes   Drug use: Yes    Types: Marijuana   Sexual activity: Not on file  Other Topics Concern   Not on file  Social History Narrative   Not on file   Social Determinants of Health   Financial Resource Strain: Not on file  Food Insecurity: No Food Insecurity (10/10/2023)   Hunger Vital Sign    Worried About Running Out of Food in the Last Year: Never true    Ran Out of Food in the Last Year: Never true  Transportation Needs: No Transportation Needs (10/10/2023)   PRAPARE - Administrator, Civil Service (Medical): No    Lack of Transportation (Non-Medical): No  Recent Concern: Transportation Needs - Unmet Transportation Needs (08/04/2023)   PRAPARE - Administrator, Civil Service (Medical): Yes    Lack of Transportation (  Non-Medical): Yes  Physical Activity: Not on file  Stress: Not on file  Social Connections: Not on file  Intimate Partner Violence: Not At Risk (10/10/2023)   Humiliation, Afraid, Rape, and Kick questionnaire    Fear of Current or Ex-Partner: No    Emotionally Abused: No    Physically Abused: No    Sexually Abused: No    Family History  Problem Relation Age of Onset   Breast cancer Mother    Diabetes Father    Hypertension Father    Breast cancer Sister    Allergies  Allergen Reactions   Penicillins Anaphylaxis    Has patient had a PCN reaction causing immediate rash, facial/tongue/throat swelling, SOB or lightheadedness with hypotension: Yes Has patient  had a PCN reaction causing severe rash involving mucus membranes or skin necrosis: No Has patient had a PCN reaction that required hospitalization No Has patient had a PCN reaction occurring within the last 10 years: No If all of the above answers are "NO", then may proceed with Cephalosporin use.   Cephalosporins Other (See Comments)    Reaction:  Unknown    I? Current Facility-Administered Medications  Medication Dose Route Frequency Provider Last Rate Last Admin   (feeding supplement) PROSource Plus liquid 30 mL  30 mL Oral BID BM Cox, Amy N, DO       acetaminophen (TYLENOL) tablet 650 mg  650 mg Oral Q6H PRN Cox, Amy N, DO       Or   acetaminophen (TYLENOL) suppository 650 mg  650 mg Rectal Q6H PRN Cox, Amy N, DO       ascorbic acid (VITAMIN C) tablet 500 mg  500 mg Oral BID Cox, Amy N, DO   500 mg at 10/10/23 4132   cholecalciferol (VITAMIN D3) tablet 5,000 Units  5,000 Units Oral Daily Cox, Amy N, DO   5,000 Units at 10/10/23 0950   heparin ADULT infusion 100 units/mL (25000 units/296mL)  1,300 Units/hr Intravenous Continuous Enedina Finner, MD       hydrALAZINE (APRESOLINE) injection 5 mg  5 mg Intravenous Q6H PRN Cox, Amy N, DO       levofloxacin (LEVAQUIN) IVPB 750 mg  750 mg Intravenous Q24H Cox, Amy N, DO       multivitamin with minerals tablet 1 tablet  1 tablet Oral Daily Cox, Amy N, DO   1 tablet at 10/10/23 0950   ondansetron (ZOFRAN) tablet 4 mg  4 mg Oral Q6H PRN Cox, Amy N, DO       Or   ondansetron (ZOFRAN) injection 4 mg  4 mg Intravenous Q6H PRN Cox, Amy N, DO   4 mg at 10/10/23 1116   pantoprazole (PROTONIX) injection 80 mg  80 mg Intravenous BID Cox, Amy N, DO   80 mg at 10/10/23 4401   senna-docusate (Senokot-S) tablet 1 tablet  1 tablet Oral QHS PRN Cox, Amy N, DO       thiamine (VITAMIN B1) tablet 100 mg  100 mg Oral Daily Cox, Amy N, DO   100 mg at 10/10/23 0950   traZODone (DESYREL) tablet 100 mg  100 mg Oral QHS PRN Cox, Amy N, DO       vancomycin (VANCOREADY)  IVPB 750 mg/150 mL  750 mg Intravenous Q12H Cox, Amy N, DO 0 mL/hr at 10/10/23 0144 1,000 mg at 10/10/23 1143   zinc sulfate capsule 220 mg  220 mg Oral Daily Cox, Amy N, DO   220 mg at 10/10/23 [phone]  Abtx:  Anti-infectives (From admission, onward)    Start     Dose/Rate Route Frequency Ordered Stop   10/10/23 1300  levofloxacin (LEVAQUIN) IVPB 750 mg        750 mg 100 mL/hr over 90 Minutes Intravenous Every 24 hours 10/09/23 1456     10/10/23 0000  vancomycin (VANCOREADY) IVPB 750 mg/150 mL        750 mg 150 mL/hr over 60 Minutes Intravenous Every 12 hours 10/09/23 1456     10/09/23 1200  vancomycin (VANCOCIN) IVPB 1000 mg/200 mL premix        1,000 mg 200 mL/hr over 60 Minutes Intravenous  Once 10/09/23 1148 10/09/23 1318   10/09/23 1200  levofloxacin (LEVAQUIN) IVPB 750 mg        750 mg 100 mL/hr over 90 Minutes Intravenous  Once 10/09/23 1148 10/09/23 1510       REVIEW OF SYSTEMS:  Const: negative fever, negative chills, some weight loss Eyes: negative diplopia or visual changes, negative eye pain ENT: negative coryza, negative sore throat Resp: negative cough, hemoptysis, dyspnea Cards: negative for chest pain, palpitations, lower extremity edema GU: negative for frequency, dysuria and hematuria GI: Negative for abdominal pain, diarrhea, bleeding, constipation Skin: negative for rash and pruritus Heme: Easy bruising MS: weakness Neurolo: Left-sided weakness Psych: Anxiety Endocrine: negative for thyroid, diabetes Allergy/Immunology-penicillin noted as anaphylaxis when he was a child Cephalosporins not specified and is not aware of any reaction to cephalosporin. Objective:  VITALS:  BP 99/80 (BP Location: Right Arm)   Pulse 88   Temp 97.8 F (36.6 C) (Oral)   Resp 16   Ht 5\' 10"  (1.778 m)   Wt 59.6 kg   SpO2 100%   BMI 18.85 kg/m   PHYSICAL EXAM:  General: Alert, cooperative, no distress, emaciated Head: Normocephalic, without obvious abnormality,  atraumatic. Eyes: Conjunctivae clear, anicteric sclerae. Pupils are equal ENT Nares normal. No drainage or sinus tenderness. Very poor dentition Neck:  symmetrical, no adenopathy, thyroid: non tender no carotid bruit and no JVD. Back: did not examine Lungs: b/l air entry Heart: Regular rate and rhythm, no murmur, rub or gallop. Abdomen: Soft, non-tender,not distended. Bowel sounds normal. No masses Extremities: atraumatic, no cyanosis. No edema. No clubbing Skin: No rashes or lesions. Or bruising Lymph: Cervical, supraclavicular normal. Neurologic: did not examine in detail Pertinent Labs Lab Results CBC    Component Value Date/Time   WBC 9.6 10/10/2023 0050   RBC 2.55 (L) 10/10/2023 0050   HGB 7.8 (L) 10/10/2023 1037   HGB 10.6 (L) 08/22/2014 1831   HCT 23.0 (L) 10/10/2023 1037   HCT 24.9 (L) 08/21/2014 0400   PLT 262 10/10/2023 0050   PLT 186 08/21/2014 0400   MCV 82.0 10/10/2023 0050   MCV 64 (L) 08/21/2014 0400   MCH 25.9 (L) 10/10/2023 0050   MCHC 31.6 10/10/2023 0050   RDW 14.4 10/10/2023 0050   RDW 21.8 (H) 08/21/2014 0400   LYMPHSABS 0.9 10/09/2023 1144   LYMPHSABS 1.0 08/21/2014 0400   MONOABS 0.4 10/09/2023 1144   MONOABS 0.7 08/21/2014 0400   EOSABS 0.0 10/09/2023 1144   EOSABS 0.3 08/21/2014 0400   BASOSABS 0.0 10/09/2023 1144   BASOSABS 0.0 08/21/2014 0400       Latest Ref Rng & Units 10/10/2023   10:37 AM 10/10/2023   12:50 AM 10/09/2023   11:44 AM  CMP  Glucose 70 - 99 mg/dL 88  97  90   BUN 8 - 23 mg/dL 15  20  24   Creatinine 0.61 - 1.24 mg/dL 1.61  0.96  0.45   Sodium 135 - 145 mmol/L 131  132  135   Potassium 3.5 - 5.1 mmol/L 4.1  2.8  3.3   Chloride 98 - 111 mmol/L 99  100  95   CO2 22 - 32 mmol/L  28  30   Calcium 8.9 - 10.3 mg/dL  7.4  8.3   Total Protein 6.5 - 8.1 g/dL   5.5   Total Bilirubin 0.3 - 1.2 mg/dL   0.9   Alkaline Phos 38 - 126 U/L   84   AST 15 - 41 U/L   38   ALT 0 - 44 U/L   18       Microbiology: Recent Results  (from the past 240 hour(s))  Culture, blood (Routine x 2)     Status: None (Preliminary result)   Collection Time: 10/09/23 11:44 AM   Specimen: BLOOD  Result Value Ref Range Status   Specimen Description BLOOD RIGHT ANTECUBITAL  Final   Special Requests   Final    BOTTLES DRAWN AEROBIC AND ANAEROBIC Blood Culture results may not be optimal due to an excessive volume of blood received in culture bottles   Culture   Final    NO GROWTH < 24 HOURS Performed at [facility], [address] [place], [place] [address]    Report Status PENDING  Incomplete  Culture, blood (Routine x 2)     Status: None (Preliminary result)   Collection Time: 10/09/23 12:03 PM   Specimen: BLOOD  Result Value Ref Range Status   Specimen Description   Final    BLOOD BLOOD LEFT FOREARM Performed at [facility], [address], [place], [place] [address]    Special Requests   Final    BOTTLES DRAWN AEROBIC AND ANAEROBIC Blood Culture results may not be optimal due to an inadequate volume of blood received in culture bottles Performed at [facility], [address] [place], [place] [address]    Culture  Setup Time   Final    GRAM POSITIVE COCCI ANAEROBIC BOTTLE ONLY CRITICAL RESULT CALLED TO, READ BACK BY AND VERIFIED WITH: NATHAN BELUE AT 0518 10/10/23.PMF Performed at [facility], [address], [place], [place] [address]    Culture GRAM POSITIVE COCCI  Final   Report Status PENDING  Incomplete  Blood Culture ID Panel (Reflexed)     Status: Abnormal   Collection Time: 10/09/23 12:03 PM  Result Value Ref Range Status   Enterococcus faecalis NOT DETECTED NOT DETECTED Final   Enterococcus Faecium NOT DETECTED NOT DETECTED Final   Listeria monocytogenes NOT DETECTED NOT DETECTED Final   Staphylococcus species DETECTED (A) NOT DETECTED Final    Comment: CRITICAL RESULT CALLED TO, READ BACK BY AND VERIFIED WITH: NATHAN BELUE AT 0518 10/10/23.PMF    Staphylococcus  aureus (BCID) NOT DETECTED NOT DETECTED Final   Staphylococcus epidermidis DETECTED (A) NOT DETECTED Final    Comment: Methicillin (oxacillin) resistant coagulase negative staphylococcus. Possible blood culture contaminant (unless isolated from more than one blood culture draw or clinical case suggests pathogenicity). No antibiotic treatment is indicated for blood  culture contaminants. CRITICAL RESULT CALLED TO, READ BACK BY AND VERIFIED WITH: NATHAN BELUE AT 0518 10/10/23.PMF    Staphylococcus lugdunensis NOT DETECTED NOT DETECTED Final   Streptococcus species NOT DETECTED NOT DETECTED Final   Streptococcus agalactiae NOT DETECTED NOT DETECTED Final   Streptococcus pneumoniae  NOT DETECTED NOT DETECTED Final   Streptococcus pyogenes NOT DETECTED NOT DETECTED Final   A.calcoaceticus-baumannii NOT DETECTED NOT DETECTED Final   Bacteroides fragilis NOT DETECTED NOT DETECTED Final   Enterobacterales NOT DETECTED NOT DETECTED Final   Enterobacter cloacae complex NOT DETECTED NOT DETECTED Final   Escherichia coli NOT DETECTED NOT DETECTED Final   Klebsiella aerogenes NOT DETECTED NOT DETECTED Final   Klebsiella oxytoca NOT DETECTED NOT DETECTED Final   Klebsiella pneumoniae NOT DETECTED NOT DETECTED Final   Proteus species NOT DETECTED NOT DETECTED Final   Salmonella species NOT DETECTED NOT DETECTED Final   Serratia marcescens NOT DETECTED NOT DETECTED Final   Haemophilus influenzae NOT DETECTED NOT DETECTED Final   Neisseria meningitidis NOT DETECTED NOT DETECTED Final   Pseudomonas aeruginosa NOT DETECTED NOT DETECTED Final   Stenotrophomonas maltophilia NOT DETECTED NOT DETECTED Final   Candida albicans NOT DETECTED NOT DETECTED Final   Candida auris NOT DETECTED NOT DETECTED Final   Candida glabrata NOT DETECTED NOT DETECTED Final   Candida krusei NOT DETECTED NOT DETECTED Final   Candida parapsilosis NOT DETECTED NOT DETECTED Final   Candida tropicalis NOT DETECTED NOT DETECTED Final    Cryptococcus neoformans/gattii NOT DETECTED NOT DETECTED Final   Methicillin resistance mecA/C DETECTED (A) NOT DETECTED Final    Comment: CRITICAL RESULT CALLED TO, READ BACK BY AND VERIFIED WITH: NATHAN BELUE AT 0518 10/10/23.PMF Performed at [facility], [address] [place], [place] [address]     IMAGING RESULTS: CT abd and pelvis- Sacral decubitus I have personally reviewed the films ? Impression/Recommendation ?Sacral decubitus- did not examine as he had debridement this afternoon- sos did not remove dressing Pt on vanco and levaquin. Change levaquin to meropenem so as to cover anerobes  Await bone culture  Anemia  PCN allergy as a child But no known cephalosporin allergy  Pt is bed bound and says he had refused PT because he is hot headed  Ulcerative colitis H/o CVA  H/o RT internal jugular thrombosis and on ? Discussed the management with the patient and his friend  ID will not  follow him this weekend. For any urgent issues call the [facility] physician on call   ________________________________________________ Discussed with patient, requesting provider Note:  This document was prepared using Dragon voice recognition software and may include unintentional dictation errors.

## 2023-10-10 NOTE — Progress Notes (Signed)
[facility]  - [place] [facility]   PATIENT NAME: [name]    MR#:  [id number]  DATE OF BIRTH:  1961/11/16  SUBJECTIVE:  patient seen on the floor he status post sacral decubitus debridement by general surgery. No family at bedside. Patient is from [facility]. He is bedbound. Does not participate with PT from report. Came in with foul-smelling sacral decubitus.    VITALS:  Blood pressure 99/80, pulse 88, temperature (!) 97.4 F (36.3 C), temperature source Temporal, resp. rate 10, height 5\' 10"  (1.778 m), weight 59.6 kg, SpO2 100%.  PHYSICAL EXAMINATION:   GENERAL:  62 y.o.-year-old patient with no acute distress. Thin cachectic LUNGS: Normal breath sounds bilaterally, CARDIOVASCULAR: S1, S2 normal. No murmur   ABDOMEN: Soft, nontender, nondistended.  EXTREMITIES: No  edema b/l.    NEUROLOGIC: nonfocal  patient is alert and awake SKIN: over the sacral decubitus  LABORATORY PANEL:  CBC Recent Labs  Lab 10/10/23 0050 10/10/23 1037  WBC 9.6  --   HGB 6.6* 7.8*  HCT 20.9* 23.0*  PLT 262  --     Chemistries  Recent Labs  Lab 10/09/23 1144 10/10/23 0050 10/10/23 1037  NA 135 132* 131*  K 3.3* 2.8* 4.1  CL 95* 100 99  CO2 30 28  --   GLUCOSE 90 97 88  BUN 24* 20 15  CREATININE 0.84 0.82 0.90  CALCIUM 8.3* 7.4*  --   MG 1.7 1.3*  --   AST 38  --   --   ALT 18  --   --   ALKPHOS 84  --   --   BILITOT 0.9  --   --    Cardiac Enzymes No results for input(s): "TROPONINI" in the last 168 hours. RADIOLOGY:  CT ABDOMEN PELVIS W CONTRAST  Result Date: 10/09/2023 CLINICAL DATA:  Sepsis. Sacral decubitus ulcer. Ulcerative colitis. EXAM: CT ABDOMEN AND PELVIS WITH CONTRAST TECHNIQUE: Multidetector CT imaging of the abdomen and pelvis was performed using the standard protocol following bolus administration of intravenous contrast. RADIATION DOSE REDUCTION: This exam was performed according to the departmental dose-optimization program  which includes automated exposure control, adjustment of the mA and/or kV according to patient size and/or use of iterative reconstruction technique. CONTRAST:  OMNIPAQUE IOHEXOL 300 MG/ML  SOLN COMPARISON:  08/25/2023 FINDINGS: Lower Chest: Tiny left pleural effusion and mild dependent atelectasis. Hepatobiliary: No suspicious hepatic masses identified. Gallbladder is unremarkable. No evidence of biliary ductal dilatation. Pancreas:  No mass or inflammatory changes. Spleen: Within normal limits in size and appearance. Adrenals/Urinary Tract: No suspicious masses identified. No evidence of ureteral calculi or hydronephrosis. Stomach/Bowel: Mild diffuse colonic wall thickening and mucosal hyperenhancement is again seen, consistent with mild colitis. No evidence of involvement of terminal ileum or other small bowel. No evidence of bowel obstruction or abscess. Vascular/Lymphatic: No pathologically enlarged lymph nodes. No acute vascular findings. Congenital left-sided IVC again noted. Reproductive:  No mass or other significant abnormality. Other:  None. Musculoskeletal: Large decubitus ulcer is seen along the right posterior aspect of the lower sacrum. No definite evidence of sacral osteomyelitis or other suspicious bone lesions. IMPRESSION: Mild diffuse colitis, consistent with known diagnosis of ulcerative colitis. No evidence of bowel obstruction, perforation, or abscess. Large decubitus ulcer along right posterior aspect of lower sacrum. No definite evidence of sacral osteomyelitis. Tiny left pleural effusion and mild dependent atelectasis. Electronically Signed   By: Danae Orleans M.D.   On: 10/09/2023 15:12   DG  Chest Port 1 View  Result Date: 10/09/2023 CLINICAL DATA:  Sepsis. EXAM: PORTABLE CHEST 1 VIEW COMPARISON:  Chest radiograph dated August 04, 2023 FINDINGS: The heart size and mediastinal contours are within normal limits. Both lungs are clear. No pneumothorax or sizable pleural effusion. No  acute osseous abnormality. IMPRESSION: No acute cardiopulmonary findings. Electronically Signed   By: Hart Robinsons M.D.   On: 10/09/2023 14:37    Assessment and Plan  [name] is a 62 year old male with history of hypertension, hyperlipidemia, stroke, bedbound state, 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   His girlfriend at bedside reports that he dismissed physical therapy at the SNF and refuses to participate.   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 --Concerning for sacral osteomyelitis in setting of chronic bedbound state and refusal to participate with physical therapy at nursing facility --CT abd--no OM noted --Continue with vancomycin and levofloxacin per pharmacy --Blood cultures x 2  1/2 GPC --Surgical consult with Dr Maurine Minister-- pt is s/p sacral decubitus debridement in the OR   severe sepsis  --patient's elevated lactic acid of 2.6, leukocytosis, increased heart rate, increased respiration rate, source of infection is sacral wound concerning for osteomyelitis --Patient is status post sodium chloride 2 L bolus per EDP --Continue with LR infusion at 105 mL/h   GI bleeding History of GI bleeding secondary to ulcerative colitis   HTN (hypertension) -- blood pressure currently is soft.  Hypokalemia potassium repleted   Protein-calorie malnutrition, severe -- resume diet. Dietitian to see patient   Acute blood loss anemia Suspect secondary to sacral decubitus bleeding Baseline hemoglobin is 10.1-11.4 Hemoglobin on admission is 8.8-- 6.6--- three unit blood transfusion-- 7.8  History of DVT and PE -- eliquis on hold for now -- resume heparin drip six hours postop. Discussed with general surgery.   Procedures: sacral decubitus debridement Family communication : none Consults : general surgery,  infectious disease CODE STATUS: full DVT Prophylaxis : heparin drip Level of care: Progressive Status is: Inpatient Remains inpatient appropriate because: sepsis    TOTAL TIME TAKING CARE OF THIS PATIENT: 45 minutes.  >50% time spent on counselling and coordination of care  Note: This dictation was prepared with Dragon dictation along with smaller phrase technology. Any transcriptional errors that result from this process are unintentional.  Enedina Finner M.D    [facility]   CC: Primary care physician; System, Provider Not In

## 2023-10-10 NOTE — Brief Op Note (Signed)
10/10/2023  12:33 PM  PATIENT:  [name]  62 y.o. male  PRE-OPERATIVE DIAGNOSIS:  SACRAL DECUBITUS ULSCER  POST-OPERATIVE DIAGNOSIS:  SACRAL DECUBITUS ULSCER  PROCEDURE:  Procedure(s): IRRIGATION AND DEBRIDEMENT SACAL WOUND (N/A)  Excisional debridement of skin, subcutaneous tissue, fascia and bone measuring 15 x 16 x 3cm  SURGEON:  Surgeons and Role:    * Kandis Cocking, MD - Primary  PHYSICIAN ASSISTANT:   ASSISTANTS: none   ANESTHESIA:   general  EBL:  100ccs   BLOOD ADMINISTERED:2uPRBC  DRAINS: none   LOCAL MEDICATIONS USED:  NONE  SPECIMEN:  Source of Specimen:  bone biopsy  DISPOSITION OF SPECIMEN:  PATHOLOGY  COUNTS:  YES  TOURNIQUET:  * No tourniquets in log *  DICTATION: .Dragon Dictation  PLAN OF CARE: Admit to inpatient   PATIENT DISPOSITION:  PACU - hemodynamically stable.   Delay start of Pharmacological VTE agent (>24hrs) due to surgical blood loss or risk of bleeding: Can restart heparin gtt WITHOUT BOLUS in 8 hours

## 2023-10-10 NOTE — Progress Notes (Signed)
Patient s/p debridement of sacral decubitus  Prior to operation possible aspiration in Pre-op holding area, bile noted in posterior oropharynx on intubation.  Wound packed with dakin's soaked kerlix and dressed with ABD pads.   Restart heparin w/out bolus in 6 hours Will leave NPO for now given hx of dysphagia and defer management of diet to Medicine team, from surgical standpoint okay to eat Plan for wound change tomorrow  Above discussed with Dr. Allena Katz

## 2023-10-10 NOTE — Progress Notes (Addendum)
PHARMACY - ANTICOAGULATION CONSULT NOTE  Pharmacy Consult for heparin infusion Indication: h/o DVT, PE  Allergies  Allergen Reactions   Penicillins Anaphylaxis    Has patient had a PCN reaction causing immediate rash, facial/tongue/throat swelling, SOB or lightheadedness with hypotension: Yes Has patient had a PCN reaction causing severe rash involving mucus membranes or skin necrosis: No Has patient had a PCN reaction that required hospitalization No Has patient had a PCN reaction occurring within the last 10 years: No If all of the above answers are "NO", then may proceed with Cephalosporin use.   Cephalosporins Other (See Comments)    Reaction:  Unknown     Patient Measurements: Height: 5\' 10"  (177.8 cm) Weight: 59.6 kg (131 lb 6.3 oz) IBW/kg (Calculated) : 73 Heparin Dosing Weight: 59.6 kg  Vital Signs: Temp: 97.8 F (36.6 C) (10/31 1629) Temp Source: Oral (10/31 1629) BP: 95/72 (11/01 0030) Pulse Rate: 97 (11/01 0030)  Labs: Recent Labs    10/09/23 1144 10/10/23 0050  HGB 8.8* 6.6*  HCT 27.1* 20.9*  PLT 376 262  LABPROT 18.6*  --   INR 1.5*  --   CREATININE 0.84 0.82    Estimated Creatinine Clearance: 79.7 mL/min (by C-G formula based on SCr of 0.82 mg/dL).   Medical History: Past Medical History:  Diagnosis Date   Dysphagia    Hyperlipidemia    Hypertension    Pulmonary air embolism (HCC)    Stroke (HCC)    Ulcerative colitis (HCC)     Medications:  Scheduled:   (feeding supplement) PROSource Plus  30 mL Oral BID BM   ascorbic acid  500 mg Oral BID   cholecalciferol  5,000 Units Oral Daily   multivitamin with minerals  1 tablet Oral Daily   pantoprazole (PROTONIX) IV  80 mg Intravenous BID   thiamine  100 mg Oral Daily   zinc sulfate  220 mg Oral Daily    Assessment: 62 y.o. male w/ PMH of HTN, HLD, CVA, bedbound state, history of internal jugular DVT and pulmonary embolism in right lower lobe admitted on 10/09/2023 with sacral osteomyelitis.  Last dose of apixaban 10/08/23 2200    Baseline labs: INR 1.5, Hgb 8.8, PLT wnl  Goal of Therapy:  Heparin level 0.3-0.7 units/ml aPTT 66 - 102 seconds Monitor platelets by anticoagulation protocol: Yes   11/01 0050 aPTT > 200, supratherapeutic  Plan:  ---Contacted RN.  Labs drawn from opposite arm as infusion.  Hold infusion for 1 hour. ---Restart heparin infusion at 1300 units/hr ---Hold heparin infusion 4 hours prior to irrigation and debridement sacral wound procedure scheduled for 0945 ---Pharmacy to F/U regarding anticoag plan after post-op  Otelia Sergeant, PharmD, [facility] 10/10/2023 2:19 AM

## 2023-10-10 NOTE — Anesthesia Procedure Notes (Signed)
Procedure Name: Intubation Date/Time: 10/10/2023 11:47 AM  Performed by: Maryla Morrow., CRNAPre-anesthesia Checklist: Patient identified, Patient being monitored, Timeout performed, Emergency Drugs available and Suction available Patient Re-evaluated:Patient Re-evaluated prior to induction Oxygen Delivery Method: Circle system utilized Preoxygenation: Pre-oxygenation with 100% oxygen Induction Type: IV induction Ventilation: Mask ventilation without difficulty Laryngoscope Size: 3 and McGraph Grade View: Grade I Tube type: Oral Tube size: 7.5 mm Number of attempts: 1 Airway Equipment and Method: Stylet Placement Confirmation: ETT inserted through vocal cords under direct vision, positive ETCO2 and breath sounds checked- equal and bilateral Secured at: 21 cm Tube secured with: Tape Dental Injury: Teeth and Oropharynx as per pre-operative assessment

## 2023-10-10 NOTE — Transfer of Care (Signed)
Immediate Anesthesia Transfer of Care Note  Patient: [name]  Procedure(s) Performed: IRRIGATION AND DEBRIDEMENT SACAL WOUND (Back)  Patient Location: PACU  Anesthesia Type:General  Level of Consciousness: awake and alert   Airway & Oxygen Therapy: Patient Spontanous Breathing and Patient connected to face mask oxygen  Post-op Assessment: Report given to RN and Post -op Vital signs reviewed and stable  Post vital signs: stable  Last Vitals:  Vitals Value Taken Time  BP 94/77 10/10/23 1300  Temp 35.8 C 10/10/23 1240  Pulse 90 10/10/23 1308  Resp 24 10/10/23 1308  SpO2 100 % 10/10/23 1308  Vitals shown include unfiled device data.  Last Pain:  Vitals:   10/10/23 1240  TempSrc:   PainSc: 0-No pain         Complications: No notable events documented.

## 2023-10-10 NOTE — Progress Notes (Signed)
PHARMACY - PHYSICIAN COMMUNICATION CRITICAL VALUE ALERT - BLOOD CULTURE IDENTIFICATION (BCID)  Results for orders placed or performed during the hospital encounter of 10/09/23  Culture, blood (Routine x 2)     Status: None (Preliminary result)   Collection Time: 10/09/23 12:03 PM   Specimen: BLOOD  Result Value Ref Range Status   Specimen Description BLOOD BLOOD LEFT FOREARM  Final   Special Requests   Final    BOTTLES DRAWN AEROBIC AND ANAEROBIC Blood Culture results may not be optimal due to an inadequate volume of blood received in culture bottles   Culture  Setup Time   Final    Organism ID to follow GRAM POSITIVE COCCI ANAEROBIC BOTTLE ONLY CRITICAL RESULT CALLED TO, READ BACK BY AND VERIFIED WITH: [name] AT 0518 10/10/23.PMF Performed at [facility], [address] [place], [place] [address]    Culture [facility] POSITIVE COCCI  Final   Report Status PENDING  Incomplete  Blood Culture ID Panel (Reflexed)     Status: Abnormal   Collection Time: 10/09/23 12:03 PM  Result Value Ref Range Status   Enterococcus faecalis NOT DETECTED NOT DETECTED Final   Enterococcus Faecium NOT DETECTED NOT DETECTED Final   Listeria monocytogenes NOT DETECTED NOT DETECTED Final   Staphylococcus species DETECTED (A) NOT DETECTED Final    Comment: CRITICAL RESULT CALLED TO, READ BACK BY AND VERIFIED WITH: [name] AT 0518 10/10/23.PMF    Staphylococcus aureus (BCID) NOT DETECTED NOT DETECTED Final   Staphylococcus epidermidis DETECTED (A) NOT DETECTED Final    Comment: Methicillin (oxacillin) resistant coagulase negative staphylococcus. Possible blood culture contaminant (unless isolated from more than one blood culture draw or clinical case suggests pathogenicity). No antibiotic treatment is indicated for blood  culture contaminants. CRITICAL RESULT CALLED TO, READ BACK BY AND VERIFIED WITH: [name] AT 0518 10/10/23.PMF    Staphylococcus lugdunensis NOT DETECTED NOT DETECTED  Final   Streptococcus species NOT DETECTED NOT DETECTED Final   Streptococcus agalactiae NOT DETECTED NOT DETECTED Final   Streptococcus pneumoniae NOT DETECTED NOT DETECTED Final   Streptococcus pyogenes NOT DETECTED NOT DETECTED Final   A.calcoaceticus-baumannii NOT DETECTED NOT DETECTED Final   Bacteroides fragilis NOT DETECTED NOT DETECTED Final   Enterobacterales NOT DETECTED NOT DETECTED Final   Enterobacter cloacae complex NOT DETECTED NOT DETECTED Final   Escherichia coli NOT DETECTED NOT DETECTED Final   Klebsiella aerogenes NOT DETECTED NOT DETECTED Final   Klebsiella oxytoca NOT DETECTED NOT DETECTED Final   Klebsiella pneumoniae NOT DETECTED NOT DETECTED Final   Proteus species NOT DETECTED NOT DETECTED Final   Salmonella species NOT DETECTED NOT DETECTED Final   Serratia marcescens NOT DETECTED NOT DETECTED Final   Haemophilus influenzae NOT DETECTED NOT DETECTED Final   Neisseria meningitidis NOT DETECTED NOT DETECTED Final   Pseudomonas aeruginosa NOT DETECTED NOT DETECTED Final   Stenotrophomonas maltophilia NOT DETECTED NOT DETECTED Final   Candida albicans NOT DETECTED NOT DETECTED Final   Candida auris NOT DETECTED NOT DETECTED Final   Candida glabrata NOT DETECTED NOT DETECTED Final   Candida krusei NOT DETECTED NOT DETECTED Final   Candida parapsilosis NOT DETECTED NOT DETECTED Final   Candida tropicalis NOT DETECTED NOT DETECTED Final   Cryptococcus neoformans/gattii NOT DETECTED NOT DETECTED Final   Methicillin resistance mecA/C DETECTED (A) NOT DETECTED Final    Comment: CRITICAL RESULT CALLED TO, READ BACK BY AND VERIFIED WITH: [name] AT 0518 10/10/23.PMF Performed at [facility], [address] [place],  [place] [address]     BCID Results: 1 (anaerobic) of 4 bottles w/ Staph Epi, mecA/C detected.  Pt already on Vancomycin and Levaquin.  Pt has I & D procedure scheduled for later this AM.  Name of provider contacted: Cliffton Asters, NP    Changes to prescribed antibiotics required: No changes at this time.  Plan for F/U after I & D procedure.  Otelia Sergeant, PharmD, [facility] 10/10/2023 5:49 AM

## 2023-10-10 NOTE — ED Notes (Addendum)
Patient's friend Britta Mccreedy) called at pt request to ask her to take care of his bills. Update provided to [facility] at that time including proposed surgical time

## 2023-10-10 NOTE — Progress Notes (Signed)
       CROSS COVER NOTE  NAME: [name] MRN: [id number] DOB : 18-May-1961    Concern as stated by nurse / staff   Page received from nurse around 2 am reported hgb 6.6 . Heparin infusion already stopped by pharmacy     Pertinent findings on chart review: Patient admitted with sacral osteomyelitis from chornic wound. He was started on heparin infusion as eliquis bridge in case of need for sacral debriement. Patient was started on eliquis with last hospitalization (discharge 9/19) due to acute pulmonary embolism within the right lower lobe segment ,  + occlusive internal jugular DVT on imaging, +occlusive thrombus right cephalic vein, + DVT left profundus, femoral, popliteal, and peroneal veins, +DVT right posterior tibial and peroneal veins.  He has also had episodes of bleeding with ulcerative colitis flares and has had CVA in past  Assessment and  Interventions   Assessment:    10/10/2023   12:30 AM 10/10/2023   12:00 AM 10/09/2023   11:00 PM  Vitals with BMI  Systolic 95 92 94  Diastolic 72 66 70  Pulse 97 97 98   Oxygen sats 100% on room air Amag level 1.3 K 2.8 Plan: Consider referral for hypercoagulopathy workup  Transfuse 1 unit PRBCs Insert second peripheral IV 6 gm mag relacement ordered Total 80 mEQ  potassium ordered       [name] NP [facility] Cross Cover 7pm-7am - check amion for availability Pager [phone]

## 2023-10-11 ENCOUNTER — Encounter: Payer: Self-pay | Admitting: General Surgery

## 2023-10-11 DIAGNOSIS — M4628 Osteomyelitis of vertebra, sacral and sacrococcygeal region: Secondary | ICD-10-CM | POA: Diagnosis not present

## 2023-10-11 LAB — URINALYSIS, W/ REFLEX TO CULTURE (INFECTION SUSPECTED)
Bacteria, UA: NONE SEEN
Bilirubin Urine: NEGATIVE
Glucose, UA: NEGATIVE mg/dL
Hgb urine dipstick: NEGATIVE
Ketones, ur: NEGATIVE mg/dL
Leukocytes,Ua: NEGATIVE
Nitrite: NEGATIVE
Protein, ur: NEGATIVE mg/dL
Specific Gravity, Urine: 1.021 (ref 1.005–1.030)
pH: 5 (ref 5.0–8.0)

## 2023-10-11 LAB — HEMOGLOBIN AND HEMATOCRIT, BLOOD
HCT: 30.5 % — ABNORMAL LOW (ref 39.0–52.0)
Hemoglobin: 10.5 g/dL — ABNORMAL LOW (ref 13.0–17.0)

## 2023-10-11 LAB — HEPARIN LEVEL (UNFRACTIONATED)
Heparin Unfractionated: 0.79 [IU]/mL — ABNORMAL HIGH (ref 0.30–0.70)
Heparin Unfractionated: 0.65 [IU]/mL (ref 0.30–0.70)

## 2023-10-11 MED ORDER — APIXABAN 5 MG PO TABS
5.0000 mg | ORAL_TABLET | Freq: Two times a day (BID) | ORAL | Status: DC
  Administered 2023-10-11 – 2023-10-17 (×12): 5 mg via ORAL
  Filled 2023-10-11 (×13): qty 1

## 2023-10-11 MED ORDER — MIDODRINE HCL 5 MG PO TABS
5.0000 mg | ORAL_TABLET | Freq: Three times a day (TID) | ORAL | Status: DC
  Administered 2023-10-11 – 2023-10-17 (×18): 5 mg via ORAL
  Filled 2023-10-11 (×19): qty 1

## 2023-10-11 NOTE — Progress Notes (Signed)
PHARMACY - ANTICOAGULATION CONSULT NOTE  Pharmacy Consult for heparin infusion Indication: h/o DVT, PE  Allergies  Allergen Reactions   Penicillins Anaphylaxis    10/2023 - patient does not know reaction, just says he is allergic, occurred as child.  Did not require hospitalization.   Has patient had a PCN reaction causing immediate rash, facial/tongue/throat swelling, SOB or lightheadedness with hypotension: Yes Has patient had a PCN reaction causing severe rash involving mucus membranes or skin necrosis: No Has patient had a PCN reaction that required hospitalization No Has patient had a PCN reaction occurring within the last 10 years: No   Cephalosporins Other (See Comments)    Patient unaware of reaction to cephalosporins    Patient Measurements: Height: 5\' 10"  (177.8 cm) Weight: 59.6 kg (131 lb 6.3 oz) IBW/kg (Calculated) : 73 Heparin Dosing Weight: 59.6 kg  Vital Signs: Temp: 97.7 F (36.5 C) (11/01 2313) Temp Source: Oral (11/01 2313) BP: 82/67 (11/01 2313) Pulse Rate: 100 (11/01 2313)  Labs: Recent Labs    10/09/23 1144 10/10/23 0050 10/10/23 1037 10/11/23 0144  HGB 8.8* 6.6* 7.8* 10.5*  HCT 27.1* 20.9* 23.0* 30.5*  PLT 376 262  --   --   APTT  --  >200*  --   --   LABPROT 18.6*  --   --   --   INR 1.5*  --   --   --   HEPARINUNFRC  --   --   --  0.79*  CREATININE 0.84 0.82 0.90  --     Estimated Creatinine Clearance: 72.7 mL/min (by C-G formula based on SCr of 0.9 mg/dL).   Medical History: Past Medical History:  Diagnosis Date   Dysphagia    Hyperlipidemia    Hypertension    Pulmonary air embolism (HCC)    Stroke (HCC)    Ulcerative colitis (HCC)     Medications:  Scheduled:   (feeding supplement) PROSource Plus  30 mL Oral BID BM   ascorbic acid  500 mg Oral BID   cholecalciferol  5,000 Units Oral Daily   multivitamin with minerals  1 tablet Oral Daily   thiamine  100 mg Oral Daily   zinc sulfate  220 mg Oral Daily    Assessment: 62  y.o. male w/ PMH of HTN, HLD, CVA, bedbound state, history of internal jugular DVT and pulmonary embolism in right lower lobe admitted on 10/09/2023 with sacral osteomyelitis. Last dose of apixaban 10/08/23 2200    Baseline labs: INR 1.5, Hgb 8.8, PLT wnl  Goal of Therapy:  Heparin level 0.3-0.7 units/ml aPTT 66 - 102 seconds Monitor platelets by anticoagulation protocol: Yes   11/01 0050 aPTT > 200, supratherapeutic 11/02 0144 HL 0.79, supratherapeutic  Plan:  ---Decrease heparin infusion to 1200 units/hr ---Recheck HL in 6 hrs after rate change ---CBC daily while on heparin  Otelia Sergeant, PharmD, [facility] 10/11/2023 2:33 AM

## 2023-10-11 NOTE — Plan of Care (Signed)
  Problem: Education: Goal: Knowledge of General Education information will improve Description: Including pain rating scale, medication(s)/side effects and non-pharmacologic comfort measures Outcome: Progressing   Problem: Clinical Measurements: Goal: Ability to maintain clinical measurements within normal limits will improve Outcome: Progressing Goal: Will remain free from infection Outcome: Progressing Goal: Diagnostic test results will improve Outcome: Progressing   Problem: Activity: Goal: Risk for activity intolerance will decrease Outcome: Progressing   Problem: Nutrition: Goal: Adequate nutrition will be maintained Outcome: Progressing   Problem: Elimination: Goal: Will not experience complications related to bowel motility Outcome: Progressing Goal: Will not experience complications related to urinary retention Outcome: Progressing   Problem: Safety: Goal: Ability to remain free from injury will improve Outcome: Progressing   Problem: Skin Integrity: Goal: Risk for impaired skin integrity will decrease Outcome: Progressing

## 2023-10-11 NOTE — Progress Notes (Signed)
[facility] SURGICAL PROGRESS NOTE  Hospital Day(s): 2.   Post op day(s): 1 Day Post-Op.   Interval History: Patient seen and examined, no acute events or new complaints overnight. Patient reports dressing having been reinforced during the night due to moisture and drainage.   Vital signs in last 24 hours: [min-max] current  Temp:  [97.4 F (36.3 C)-98.6 F (37 C)] 97.5 F (36.4 C) (11/02 1157) Pulse Rate:  [82-100] 88 (11/02 1157) Resp:  [10-21] 20 (11/02 1157) BP: (82-102)/(62-81) 102/75 (11/02 1157) SpO2:  [97 %-100 %] 99 % (11/02 1157)     Height: 5\' 10"  (177.8 cm) Weight: 59.6 kg BMI (Calculated): 18.85   Intake/Output last 2 shifts:  11/01 0701 - 11/02 0700 In: 3028.8 [P.O.:590; I.V.:1253.8; Blood:735; IV Piggyback:450] Out: -    Physical Exam:  Constitutional: alert, cooperative and no distress  Respiratory: breathing non-labored at rest  Cardiovascular: regular rate and sinus rhythm  Gastrointestinal: soft, non-tender, and non-distended Integumentary: Appreciate help of Alex and associate with evaluation of wound and patient positioning and assistance with dressing change.  No evidence of's of uncontrolled oozing or persistent bleeding.  Dressing saturated.    Labs:     Latest Ref Rng & Units 10/11/2023    1:44 AM 10/10/2023   10:37 AM 10/10/2023   12:50 AM  CBC  WBC 4.0 - 10.5 K/uL   9.6   Hemoglobin 13.0 - 17.0 g/dL 40.9  7.8  6.6   Hematocrit 39.0 - 52.0 % 30.5  23.0  20.9   Platelets 150 - 400 K/uL   262       Latest Ref Rng & Units 10/10/2023   10:37 AM 10/10/2023   12:50 AM 10/09/2023   11:44 AM  CMP  Glucose 70 - 99 mg/dL 88  97  90   BUN 8 - 23 mg/dL 15  20  24    Creatinine 0.61 - 1.24 mg/dL 8.11  9.14  7.82   Sodium 135 - 145 mmol/L 131  132  135   Potassium 3.5 - 5.1 mmol/L 4.1  2.8  3.3   Chloride 98 - 111 mmol/L 99  100  95   CO2 22 - 32 mmol/L  28  30   Calcium 8.9 - 10.3 mg/dL  7.4  8.3   Total Protein 6.5 - 8.1 g/dL   5.5    Total Bilirubin 0.3 - 1.2 mg/dL   0.9   Alkaline Phos 38 - 126 U/L   84   AST 15 - 41 U/L   38   ALT 0 - 44 U/L   18      Imaging studies: No new pertinent imaging studies   Assessment/Plan:  62 y.o. male with  1 Day Post-Op s/p excisional debridement of sacral decubitus for wound care, complicated by pertinent comorbidities including:  Patient Active Problem List   Diagnosis Date Noted   Sacral osteomyelitis (HCC) 10/09/2023   Hypokalemia 10/09/2023   Pressure injury of sacral region, unstageable (HCC) 10/09/2023   DVT (deep venous thrombosis) (HCC) 08/24/2023   Pneumonia 08/24/2023   Protein-calorie malnutrition, severe 08/07/2023   GI bleeding 08/04/2023   SIRS (systemic inflammatory response syndrome) (HCC) 08/04/2023   Acute metabolic encephalopathy 08/04/2023   Fall at home, initial encounter 08/04/2023   AKI (acute kidney injury) (HCC) 08/04/2023   Rhabdomyolysis 08/04/2023   Myocardial injury 08/04/2023   Abnormal LFTs 08/04/2023   HTN (hypertension) 08/04/2023   HLD (hyperlipidemia) 08/04/2023   Stroke (HCC) 08/04/2023  Severe sepsis (HCC) 08/04/2023   UTI (urinary tract infection) 08/04/2023   Alcohol use 08/04/2023   Slurred speech 08/04/2023   Pulmonary nodule 08/04/2023   Gastrointestinal bleed 12/10/2015   Hyponatremia 12/10/2015   Leukopenia 12/10/2015   Hyperglycemia 12/10/2015   Diarrhea 12/10/2015   Acute blood loss anemia 12/09/2015   Exacerbation of ulcerative colitis (HCC) 11/22/2015   Ulcerative colitis, chronic (HCC) 11/22/2015    -Anticipating daily or twice daily dressing changes.  I do not think the patient will tolerate or except twice daily dressing changes, for now we will placement once daily and monitor wound.    -Nutritional support, ensure adequate offloading.    All of the above findings and recommendations were discussed with the patient, and all of patient's questions were answered to their expressed satisfaction.  -- Campbell Lerner, M.D., [facility] 10/11/2023

## 2023-10-11 NOTE — Progress Notes (Signed)
PHARMACY - ANTICOAGULATION CONSULT NOTE  Pharmacy Consult for heparin infusion Indication: h/o DVT, PE  Allergies  Allergen Reactions   Penicillins Anaphylaxis    10/2023 - patient does not know reaction, just says he is allergic, occurred as child.  Did not require hospitalization.   Has patient had a PCN reaction causing immediate rash, facial/tongue/throat swelling, SOB or lightheadedness with hypotension: Yes Has patient had a PCN reaction causing severe rash involving mucus membranes or skin necrosis: No Has patient had a PCN reaction that required hospitalization No Has patient had a PCN reaction occurring within the last 10 years: No   Cephalosporins Other (See Comments)    Patient unaware of reaction to cephalosporins    Patient Measurements: Height: 5\' 10"  (177.8 cm) Weight: 59.6 kg (131 lb 6.3 oz) IBW/kg (Calculated) : 73 Heparin Dosing Weight: 59.6 kg  Vital Signs: Temp: 97.9 F (36.6 C) (11/02 0955) Temp Source: Oral (11/02 0955) BP: 88/72 (11/02 0955) Pulse Rate: 93 (11/02 0955)  Labs: Recent Labs    10/09/23 1144 10/10/23 0050 10/10/23 1037 10/11/23 0144 10/11/23 0902  HGB 8.8* 6.6* 7.8* 10.5*  --   HCT 27.1* 20.9* 23.0* 30.5*  --   PLT 376 262  --   --   --   APTT  --  >200*  --   --   --   LABPROT 18.6*  --   --   --   --   INR 1.5*  --   --   --   --   HEPARINUNFRC  --   --   --  0.79* 0.65  CREATININE 0.84 0.82 0.90  --   --     Estimated Creatinine Clearance: 72.7 mL/min (by C-G formula based on SCr of 0.9 mg/dL).   Medical History: Past Medical History:  Diagnosis Date   Dysphagia    Hyperlipidemia    Hypertension    Pulmonary air embolism (HCC)    Stroke (HCC)    Ulcerative colitis (HCC)     Medications:  Scheduled:   (feeding supplement) PROSource Plus  30 mL Oral BID BM   ascorbic acid  500 mg Oral BID   cholecalciferol  5,000 Units Oral Daily   midodrine  5 mg Oral TID WC   multivitamin with minerals  1 tablet Oral Daily    thiamine  100 mg Oral Daily   zinc sulfate  220 mg Oral Daily    Assessment: 62 y.o. male w/ PMH of HTN, HLD, CVA, bedbound state, history of internal jugular DVT and pulmonary embolism in right lower lobe admitted on 10/09/2023 with sacral osteomyelitis. Last dose of apixaban 10/08/23 2200    Baseline labs: INR 1.5, Hgb 8.8, PLT wnl  Goal of Therapy:  Heparin level 0.3-0.7 units/ml aPTT 66 - 102 seconds Monitor platelets by anticoagulation protocol: Yes  11/02 0144 HL 0.79, supratherapeutic 11/02 0902 HL 0.65, therapeutic x1  Plan:  ---Continue heparin infusion at 1200 units/hr ---Check confirmatory HL in 6 hrs ---CBC daily while on heparin  Bettey Costa, PharmD Clinical Pharmacist 10/11/2023 11:04 AM

## 2023-10-11 NOTE — Progress Notes (Signed)
[facility]  - [place] at [facility]   PATIENT NAME: [name]    MR#:  [id number]  DATE OF BIRTH:  September 04, 1961  SUBJECTIVE:  no family at bedside. Patient underwent sacral decubitus debridement. Currently on heparin drip will change to oral eliquis.Marland Kitchen    VITALS:  Blood pressure 102/75, pulse 88, temperature (!) 97.5 F (36.4 C), temperature source Oral, resp. rate 20, height 5\' 10"  (1.778 m), weight 59.6 kg, SpO2 99%.  PHYSICAL EXAMINATION:   GENERAL:  62-year-old patient with no acute distress. Thin cachectic LUNGS: Normal breath sounds bilaterally, CARDIOVASCULAR: S1, S2 normal. No murmur   ABDOMEN: Soft, nontender, nondistended.  EXTREMITIES: No  edema b/l.    NEUROLOGIC: nonfocal  patient is alert and awake SKIN:post op day 1   LABORATORY PANEL:  CBC Recent Labs  Lab 10/10/23 0050 10/10/23 1037 10/11/23 0144  WBC 9.6  --   --   HGB 6.6*   < > 10.5*  HCT 20.9*   < > 30.5*  PLT 262  --   --    < > = values in this interval not displayed.    Chemistries  Recent Labs  Lab 10/09/23 1144 10/10/23 0050 10/10/23 1037  NA 135 132* 131*  K 3.3* 2.8* 4.1  CL 95* 100 99  CO2 30 28  --   GLUCOSE 90 97 88  BUN 24* 20 15  CREATININE 0.84 0.82 0.90  CALCIUM 8.3* 7.4*  --   MG 1.7 1.3*  --   AST 38  --   --   ALT 18  --   --   ALKPHOS 84  --   --   BILITOT 0.9  --   --    Cardiac Enzymes No results for input(s): "TROPONINI" in the last 168 hours. RADIOLOGY:  No results found.  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   His girlfriend at bedside reports that he dismissed physical therapy at the SNF and refuses to participate.   Chronic Sacral osteomyelitis (HCC) with  worsening infection --Concerning for sacral osteomyelitis in setting of chronic bedbound state and refusal to participate with physical therapy at nursing facility --CT abd--no OM noted --Continue with vancomycin and meropenem per ID --Blood cultures x 2  1/2 GPC --await Bone bx --Surgical consult with Dr Maurine Minister-- pt is s/p sacral decubitus debridement in the OR --overall wound looks ok.    severe sepsis  --patient's elevated lactic acid of 2.6, leukocytosis, increased heart rate, increased respiration rate, source of infection is sacral wound concerning for osteomyelitis --Patient is status post sodium chloride 2 L bolus per EDP --imprvoing   GI bleeding History of GI bleeding secondary to ulcerative colitis   HTN (hypertension) -- blood pressure currently is soft. --trial of midodrine  Hypokalemia potassium repleted   Protein-calorie malnutrition, severe -- resume diet. Dietitian to see patient   Acute blood loss anemia Suspect secondary to sacral decubitus bleeding Baseline hemoglobin is 10.1-11.4 Hemoglobin on admission is 8.8-- 6.6--- three unit blood transfusion-- 7.8--10.9  History of DVT and PE -- eliquis on hold for now -- resume heparin drip six hours postop. -- Discussed with general surgery Dr Georgian Co oozing from post-op wound ok to resume eliquis   Procedures: sacral decubitus debridement Family communication :  none Consults : general surgery, infectious disease CODE STATUS: full DVT Prophylaxis : elequis Level of care: Med-Surg Status is: Inpatient Remains inpatient appropriate because: sepsis    TOTAL TIME TAKING CARE OF THIS PATIENT: 35 minutes.  >50% time spent on counselling and coordination of care  Note: This dictation was prepared with Dragon dictation along with smaller phrase technology. Any transcriptional errors that result from this process are unintentional.  Enedina Finner M.D    [facility]   CC: Primary care physician;  System, Provider Not In

## 2023-10-12 DIAGNOSIS — M4628 Osteomyelitis of vertebra, sacral and sacrococcygeal region: Secondary | ICD-10-CM | POA: Diagnosis not present

## 2023-10-12 LAB — CULTURE, BLOOD (ROUTINE X 2)

## 2023-10-12 NOTE — Progress Notes (Signed)
[facility]  - [place] at [facility]   PATIENT NAME: [name]    MR#:  [id number]  DATE OF BIRTH:  Nov 13, 1961  SUBJECTIVE:  no family at bedside. Patient underwent sacral decubitus debridement.  No new complaints    VITALS:  Blood pressure 91/73, pulse 98, temperature 98.5 F (36.9 C), temperature source Oral, resp. rate 20, height 5\' 10"  (1.778 m), weight 59.6 kg, SpO2 99%.  PHYSICAL EXAMINATION:   GENERAL:  62 y.o.-year-old patient with no acute distress. Thin cachectic LUNGS: Normal breath sounds bilaterally, CARDIOVASCULAR: S1, S2 normal. No murmur   ABDOMEN: Soft, nontender, nondistended.  EXTREMITIES: No  edema b/l.    NEUROLOGIC: nonfocal  patient is alert and awake SKIN:post op day 1   LABORATORY PANEL:  CBC Recent Labs  Lab 10/10/23 0050 10/10/23 1037 10/11/23 0144  WBC 9.6  --   --   HGB 6.6*   < > 10.5*  HCT 20.9*   < > 30.5*  PLT 262  --   --    < > = values in this interval not displayed.    Chemistries  Recent Labs  Lab 10/09/23 1144 10/10/23 0050 10/10/23 1037  NA 135 132* 131*  K 3.3* 2.8* 4.1  CL 95* 100 99  CO2 30 28  --   GLUCOSE 90 97 88  BUN 24* 20 15  CREATININE 0.84 0.82 0.90  CALCIUM 8.3* 7.4*  --   MG 1.7 1.3*  --   AST 38  --   --   ALT 18  --   --   ALKPHOS 84  --   --   BILITOT 0.9  --   --    Cardiac Enzymes No results for input(s): "TROPONINI" in the last 168 hours. RADIOLOGY:  No results found.  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   His girlfriend at bedside reports that he dismissed physical therapy at the SNF and refuses to participate.   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    severe sepsis  --patient's elevated lactic acid of 2.6, leukocytosis, increased heart rate, increased respiration rate, source of infection is sacral wound concerning for osteomyelitis --Patient is status post sodium chloride 2 L bolus per EDP --imprvoing   GI bleeding History of GI bleeding secondary to ulcerative colitis   HTN (hypertension) -- blood pressure currently is soft. --trial of midodrine  Hypokalemia potassium repleted   Protein-calorie malnutrition, severe -- resume diet. Dietitian to see patient   Acute blood loss anemia Suspect secondary to sacral decubitus bleeding Baseline hemoglobin is 10.1-11.4 Hemoglobin on admission is 8.8-- 6.6--- three unit blood transfusion-- 7.8--10.9  History of DVT and PE -- eliquis on hold for now -- resume heparin drip six hours postop. -- Discussed with general surgery Dr rodenburg--no oozing from post-op wound ok to resume eliquis   Procedures: sacral decubitus debridement Family communication : none Consults : general surgery,  infectious disease CODE STATUS: full DVT Prophylaxis : elequis Level of care: Med-Surg Status is: Inpatient Remains inpatient appropriate because: sepsis    TOTAL TIME TAKING CARE OF THIS PATIENT: 35 minutes.  >50% time spent on counselling and coordination of care  Note: This dictation was prepared with Dragon dictation along with smaller phrase technology. Any transcriptional errors that result from this process are unintentional.  Enedina Finner M.D    [facility]   CC: Primary care physician; System, Provider Not In

## 2023-10-12 NOTE — Plan of Care (Signed)
  Problem: Education: Goal: Knowledge of General Education information will improve Description: Including pain rating scale, medication(s)/side effects and non-pharmacologic comfort measures Outcome: Progressing   Problem: Clinical Measurements: Goal: Ability to maintain clinical measurements within normal limits will improve Outcome: Progressing Goal: Will remain free from infection Outcome: Progressing Goal: Diagnostic test results will improve Outcome: Progressing   Problem: Activity: Goal: Risk for activity intolerance will decrease Outcome: Progressing   Problem: Nutrition: Goal: Adequate nutrition will be maintained Outcome: Progressing   Problem: Elimination: Goal: Will not experience complications related to bowel motility Outcome: Progressing Goal: Will not experience complications related to urinary retention Outcome: Progressing   Problem: Safety: Goal: Ability to remain free from injury will improve Outcome: Progressing   Problem: Skin Integrity: Goal: Risk for impaired skin integrity will decrease Outcome: Progressing

## 2023-10-12 NOTE — Progress Notes (Signed)
[facility] SURGICAL PROGRESS NOTE  Hospital Day(s): 3.   Post op day(s): 2 Days Post-Op.   Interval History: Patient seen and examined, no acute events or new complaints overnight. Had a large liquid BM, and needed to return for dressing change.  Fortunately no soilage to wound.    Vital signs in last 24 hours: [min-max] current  Temp:  [97.3 F (36.3 C)-98.5 F (36.9 C)] 98.5 F (36.9 C) (11/03 1343) Pulse Rate:  [90-101] 98 (11/03 1343) Resp:  [18-20] 20 (11/03 1343) BP: (91-100)/(68-73) 91/73 (11/03 1343) SpO2:  [96 %-100 %] 99 % (11/03 1343)     Height: 5\' 10"  (177.8 cm) Weight: 59.6 kg BMI (Calculated): 18.85   Intake/Output last 2 shifts:  11/02 0701 - 11/03 0700 In: 500 [P.O.:500] Out: 1050 [Urine:1050]   Physical Exam:  Constitutional: alert, cooperative and no distress  Respiratory: breathing non-labored at rest  Cardiovascular: regular rate and sinus rhythm  Gastrointestinal: soft, non-tender, and non-distended Integumentary: Appreciate help of Alex again with evaluation of wound and patient positioning and assistance with dressing change.  No evidence of's of uncontrolled oozing or persistent bleeding.  Dressing saturated.   Labs:     Latest Ref Rng & Units 10/11/2023    1:44 AM 10/10/2023   10:37 AM 10/10/2023   12:50 AM  CBC  WBC 4.0 - 10.5 K/uL   9.6   Hemoglobin 13.0 - 17.0 g/dL 91.4  7.8  6.6   Hematocrit 39.0 - 52.0 % 30.5  23.0  20.9   Platelets 150 - 400 K/uL   262       Latest Ref Rng & Units 10/10/2023   10:37 AM 10/10/2023   12:50 AM 10/09/2023   11:44 AM  CMP  Glucose 70 - 99 mg/dL 88  97  90   BUN 8 - 23 mg/dL 15  20  24    Creatinine 0.61 - 1.24 mg/dL 7.82  9.56  2.13   Sodium 135 - 145 mmol/L 131  132  135   Potassium 3.5 - 5.1 mmol/L 4.1  2.8  3.3   Chloride 98 - 111 mmol/L 99  100  95   CO2 22 - 32 mmol/L  28  30   Calcium 8.9 - 10.3 mg/dL  7.4  8.3   Total Protein 6.5 - 8.1 g/dL   5.5   Total Bilirubin 0.3 - 1.2  mg/dL   0.9   Alkaline Phos 38 - 126 U/L   84   AST 15 - 41 U/L   38   ALT 0 - 44 U/L   18      Imaging studies: No new pertinent imaging studies   Assessment/Plan:  62 y.o. male with  2 Days Post-Op s/p excisional debridement of sacral decubitus for wound care, complicated by pertinent comorbidities including:  Patient Active Problem List   Diagnosis Date Noted   Sacral osteomyelitis (HCC) 10/09/2023   Hypokalemia 10/09/2023   Pressure injury of sacral region, unstageable (HCC) 10/09/2023   DVT (deep venous thrombosis) (HCC) 08/24/2023   Pneumonia 08/24/2023   Protein-calorie malnutrition, severe 08/07/2023   GI bleeding 08/04/2023   SIRS (systemic inflammatory response syndrome) (HCC) 08/04/2023   Acute metabolic encephalopathy 08/04/2023   Fall at home, initial encounter 08/04/2023   AKI (acute kidney injury) (HCC) 08/04/2023   Rhabdomyolysis 08/04/2023   Myocardial injury 08/04/2023   Abnormal LFTs 08/04/2023   HTN (hypertension) 08/04/2023   HLD (hyperlipidemia) 08/04/2023   Stroke (HCC) 08/04/2023  Severe sepsis (HCC) 08/04/2023   UTI (urinary tract infection) 08/04/2023   Alcohol use 08/04/2023   Slurred speech 08/04/2023   Pulmonary nodule 08/04/2023   Gastrointestinal bleed 12/10/2015   Hyponatremia 12/10/2015   Leukopenia 12/10/2015   Hyperglycemia 12/10/2015   Diarrhea 12/10/2015   Acute blood loss anemia 12/09/2015   Exacerbation of ulcerative colitis (HCC) 11/22/2015   Ulcerative colitis, chronic (HCC) 11/22/2015    -Anticipating daily dressing changes.  I do not think the patient will benefit from except twice daily dressing changes.    -Nutritional support, ensure adequate offloading.    All of the above findings and recommendations were discussed with the patient, and all of patient's questions were answered to their expressed satisfaction.  -- Campbell Lerner, M.D., [facility] 10/12/2023

## 2023-10-13 DIAGNOSIS — K51011 Ulcerative (chronic) pancolitis with rectal bleeding: Secondary | ICD-10-CM | POA: Diagnosis not present

## 2023-10-13 DIAGNOSIS — M4628 Osteomyelitis of vertebra, sacral and sacrococcygeal region: Secondary | ICD-10-CM | POA: Diagnosis not present

## 2023-10-13 DIAGNOSIS — L89150 Pressure ulcer of sacral region, unstageable: Secondary | ICD-10-CM | POA: Diagnosis not present

## 2023-10-13 DIAGNOSIS — L89154 Pressure ulcer of sacral region, stage 4: Secondary | ICD-10-CM

## 2023-10-13 LAB — TYPE AND SCREEN
ABO/RH(D): O POS
Antibody Screen: POSITIVE
Donor AG Type: NEGATIVE
Donor AG Type: NEGATIVE
Donor AG Type: NEGATIVE
Unit division: 0
Unit division: 0
Unit division: 0
Unit division: 0
Unit division: 0
Unit division: 0

## 2023-10-13 LAB — BPAM RBC
Blood Product Expiration Date: 202411192359
Blood Product Expiration Date: 202411242359
Blood Product Expiration Date: 202411282359
Blood Product Expiration Date: 202411292359
Blood Product Expiration Date: 202411222359
ISSUE DATE / TIME: 202411010416
ISSUE DATE / TIME: 202411011128
ISSUE DATE / TIME: 202411011128
Unit Type and Rh: 5100
Unit Type and Rh: 5100
Unit Type and Rh: 5100
Unit Type and Rh: 5100
Unit Type and Rh: 5100

## 2023-10-13 LAB — AEROBIC/ANAEROBIC CULTURE W GRAM STAIN (SURGICAL/DEEP WOUND)

## 2023-10-13 LAB — CREATININE, SERUM
Creatinine, Ser: 0.42 mg/dL — ABNORMAL LOW (ref 0.61–1.24)
GFR, Estimated: >60 mL/min (ref >60)

## 2023-10-13 MED ORDER — GLYCOPYRROLATE 0.2 MG/ML IJ SOLN
0.1000 mg | Freq: Three times a day (TID) | INTRAMUSCULAR | Status: DC
  Filled 2023-10-13: qty 0.5

## 2023-10-13 MED ORDER — ORAL CARE MOUTH RINSE: 15.0000 mL | OROMUCOSAL | Status: DC | PRN

## 2023-10-13 NOTE — Plan of Care (Signed)

## 2023-10-13 NOTE — Progress Notes (Signed)
ID  CVA, Ulcerative colitis,HTN, , h/o internal jugular DVT and PE hyperlipidemia  Pt has no specific complaints He refuses to turn on the side , to off load to help get the sacral wound healed He has bowel movts in bed and does not want to use bed pan O/e awake, alert, screams when asked to turn Patient Vitals for the past 24 hrs:  BP Temp Temp src Pulse Resp SpO2  10/13/23 1225 100/65 98.1 F (36.7 C) Oral (!) 101 20 99 %  10/13/23 0752 92/71 98.1 F (36.7 C) Oral 93 20 98 %  10/13/23 0313 96/67 97.9 F (36.6 C) -- 92 18 99 %  10/12/23 2320 104/75 98.7 F (37.1 C) -- 97 18 98 %  10/12/23 2010 95/74 98.6 F (37 C) Oral 99 18 98 %  10/12/23 1551 93/68 98.2 F (36.8 C) Oral 99 20 99 %  10/12/23 1343 91/73 98.5 F (36.9 C) Oral 98 20 99 %   Emaciated Sacral wound deep- covered with granulation tissue- ligament/cartilage seen   Labs    Latest Ref Rng & Units 10/11/2023    1:44 AM 10/10/2023   10:37 AM 10/10/2023   12:50 AM  CBC  WBC 4.0 - 10.5 K/uL   9.6   Hemoglobin 13.0 - 17.0 g/dL 86.5  7.8  6.6   Hematocrit 39.0 - 52.0 % 30.5  23.0  20.9   Platelets 150 - 400 K/uL   262      Micro Bone culture proteus and bacteroides BC- NG  Impression/recommendation  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 Need wound vac Proteus in culture Currently on vanco and meropenem Dc vanco  Patient has PCN allergy - as child Cephalosporin documented in chart but says unknown  and t he has no recollection - need to discuss with family and see whether we can try cephalosporin - ceftriaxone + flagyl or do ertapenem ong term IV antibiotics  may not be helpful  if he does not  follow the protocol of frequent turning and off loading and trying to participatein PT  Anemia  Ulcerative colitis  CVA  H/o rt internal jugular thrombosis /PE and on eliquis Discussed the management with patient, brother, hospitalist and surgeon

## 2023-10-13 NOTE — Anesthesia Postprocedure Evaluation (Signed)
Anesthesia Post Note  Patient: [name]  Procedure(s) Performed: IRRIGATION AND DEBRIDEMENT SACAL WOUND (Back)  Patient location during evaluation: PACU Anesthesia Type: General Level of consciousness: awake and alert Pain management: pain level controlled Vital Signs Assessment: post-procedure vital signs reviewed and stable Respiratory status: spontaneous breathing, nonlabored ventilation, respiratory function stable and patient connected to nasal cannula oxygen Cardiovascular status: blood pressure returned to baseline and stable Postop Assessment: no apparent nausea or vomiting Anesthetic complications: no   There were no known notable events for this encounter.   Last Vitals:  Vitals:   10/12/23 2010 10/12/23 2320  BP: 95/74 104/75  Pulse: 99 97  Resp: 18 18  Temp: 37 C 37.1 C  SpO2: 98% 98%    Last Pain:  Vitals:   10/12/23 2155  TempSrc:   PainSc: 0-No pain                 Lenard Simmer

## 2023-10-13 NOTE — Progress Notes (Signed)
CC: Sacral Wound Subjective: S/p wound debridment, post-op day three. Doing well. He is tolerating diet. Denies pain in area at rest.   Objective: Vital signs in last 24 hours: Temp:  [97.9 F (36.6 C)-98.7 F (37.1 C)] 98.1 F (36.7 C) (11/04 0752) Pulse Rate:  [92-99] 93 (11/04 0752) Resp:  [18-20] 20 (11/04 0752) BP: (91-104)/(67-75) 92/71 (11/04 0752) SpO2:  [98 %-99 %] 98 % (11/04 0752) Last BM Date : 10/12/23  Intake/Output from previous day: 11/03 0701 - 11/04 0700 In: 1540 [P.O.:240; IV Piggyback:1300] Out: 1700 [Urine:1700] Intake/Output this shift: No intake/output data recorded.  Physical exam:  Dressing removed, there is exposed muscle bed and bone. The muscle appears viable and is sensate. No active bleeding. No obvious necrotic tissue.   Lab Results: CBC  Recent Labs    10/10/23 1037 10/11/23 0144  HGB 7.8* 10.5*  HCT 23.0* 30.5*   BMET Recent Labs    10/10/23 1037 10/13/23 0430  NA 131*  --   K 4.1  --   CL 99  --   GLUCOSE 88  --   BUN 15  --   CREATININE 0.90 0.42*   PT/INR No results for input(s): "LABPROT", "INR" in the last 72 hours. ABG No results for input(s): "PHART", "HCO3" in the last 72 hours.  Invalid input(s): "PCO2", "PO2"  Studies/Results: No results found.  Anti-infectives: Anti-infectives (From admission, onward)    Start     Dose/Rate Route Frequency Ordered Stop   10/10/23 1800  meropenem (MERREM) 1 g in sodium chloride 0.9 % 100 mL IVPB        1 g 200 mL/hr over 30 Minutes Intravenous Every 8 hours 10/10/23 1707     10/10/23 1300  levofloxacin (LEVAQUIN) IVPB 750 mg  Status:  Discontinued        750 mg 100 mL/hr over 90 Minutes Intravenous Every 24 hours 10/09/23 1456 10/10/23 1707   10/10/23 0000  vancomycin (VANCOREADY) IVPB 750 mg/150 mL        750 mg 150 mL/hr over 60 Minutes Intravenous Every 12 hours 10/09/23 1456     10/09/23 1200  vancomycin (VANCOCIN) IVPB 1000 mg/200 mL premix        1,000 mg 200  mL/hr over 60 Minutes Intravenous  Once 10/09/23 1148 10/09/23 1318   10/09/23 1200  levofloxacin (LEVAQUIN) IVPB 750 mg        750 mg 100 mL/hr over 90 Minutes Intravenous  Once 10/09/23 1148 10/09/23 1510       Assessment/Plan:  Sacral osteomyelitis s/p debridement of sacral decubitus ulcer.  There is no obvious necrotic tissue and the wound bed is sensate. Continue once/day dressing changes for now. Appreciate ID input for abx selection and duration. Needs to offload sacrum and work with therapy (though has been resistant in past).   Baker Pierini, M.D. [facility]

## 2023-10-13 NOTE — Progress Notes (Signed)
[facility]  - [place] at [facility]   PATIENT NAME: [name]    MR#:  [id number]  DATE OF BIRTH:  11/19/1961  SUBJECTIVE:  no family at bedside. Patient underwent sacral decubitus debridement.  No new complaints    VITALS:  Blood pressure 100/65, pulse (!) 101, temperature 98.1 F (36.7 C), temperature source Oral, resp. rate 20, height 5\' 10"  (1.778 m), weight 59.6 kg, SpO2 99%.  PHYSICAL EXAMINATION:   GENERAL:  62 y.o.-year-old patient with no acute distress. Thin cachectic LUNGS: Normal breath sounds bilaterally, CARDIOVASCULAR: S1, S2 normal. No murmur   ABDOMEN: Soft, nontender, nondistended.  EXTREMITIES: No  edema b/l.    NEUROLOGIC: nonfocal  patient is alert and awake SKIN:From 10/13/2023  LABORATORY PANEL:  CBC Recent Labs  Lab 10/10/23 0050 10/10/23 1037 10/11/23 0144  WBC 9.6  --   --   HGB 6.6*   < > 10.5*  HCT 20.9*   < > 30.5*  PLT 262  --   --    < > = values in this interval not displayed.    Chemistries  Recent Labs  Lab 10/09/23 1144 10/10/23 0050 10/10/23 1037 10/13/23 0430  NA 135 132* 131*  --   K 3.3* 2.8* 4.1  --   CL 95* 100 99  --   CO2 30 28  --   --   GLUCOSE 90 97 88  --   BUN 24* 20 15  --   CREATININE 0.84 0.82 0.90 0.42*  CALCIUM 8.3* 7.4*  --   --   MG 1.7 1.3*  --   --   AST 38  --   --   --   ALT 18  --   --   --   ALKPHOS 84  --   --   --   BILITOT 0.9  --   --   --     RADIOLOGY:  No results found.  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   His girlfriend at bedside reports that he dismissed physical therapy at the SNF and refuses to participate.   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 --Concerning for  sacral osteomyelitis in setting of chronic bedbound state and refusal to participate with physical therapy at nursing facility --CT abd--no OM noted --Continue with vancomycin and meropenem per ID --Blood cultures x 2  1/2 GPC --await Bone bx --Surgical consult with Dr Maurine Minister-- pt is s/p sacral decubitus debridement in the OR --per gen surgery will need wound vac --ID recommends IV abxs only if pt is going to cooperate with offloading weight/PT at facility---> I did speak with his GF and let her know --palliative care to see   severe sepsis  --patient's elevated lactic acid of 2.6, leukocytosis, increased heart rate, increased respiration rate, source of infection is sacral wound concerning for osteomyelitis --Patient is status post sodium chloride 2 L bolus per EDP --imprvoing   GI bleeding History of GI bleeding secondary to ulcerative colitis   HTN (hypertension) -- blood pressure currently is soft. --trial of midodrine  Hypokalemia potassium repleted   Protein-calorie malnutrition, severe -- resume diet. Dietitian to see patient   Acute blood loss anemia Suspect secondary to sacral decubitus bleeding Baseline hemoglobin is 10.1-11.4 Hemoglobin  on admission is 8.8-- 6.6--- three unit blood transfusion-- 7.8--10.9  History of DVT and PE -- eliquis on hold for now -- resume heparin drip six hours postop. -- Discussed with general surgery Dr rodenburg--no oozing from post-op wound ok to resume eliquis   Procedures: sacral decubitus debridement Family communication : none Consults : general surgery, infectious disease CODE STATUS: full DVT Prophylaxis : elequis Level of care: Med-Surg Status is: Inpatient Remains inpatient appropriate because: sepsis    TOTAL TIME TAKING CARE OF THIS PATIENT: 35 minutes.  >50% time spent on counselling and coordination of care  Note: This dictation was prepared with Dragon dictation along with smaller phrase technology. Any  transcriptional errors that result from this process are unintentional.  Enedina Finner M.D    [facility]   CC: Primary care physician; System, Provider Not In

## 2023-10-13 NOTE — TOC Initial Note (Signed)
Transition of Care [facility]) - Initial/Assessment Note    Patient Details  Name: [name] MRN: [id number] Date of Birth: February 24, 1961  Transition of Care [facility]) CM/SW Contact:    Truddie Hidden, RN Phone Number: 10/13/2023, 2:18 PM  Clinical Narrative:                 Patient is from [facility]. Spoke with Kenney Houseman from [facility]. Patient is LTC.         Patient Goals and CMS Choice            Expected Discharge Plan and Services                                              Prior Living Arrangements/Services                       Activities of Daily Living   ADL Screening (condition at time of admission) Independently performs ADLs?: No Does the patient have a NEW difficulty with bathing/dressing/toileting/self-feeding that is expected to last >3 days?: No Does the patient have a NEW difficulty with getting in/out of bed, walking, or climbing stairs that is expected to last >3 days?: No Does the patient have a NEW difficulty with communication that is expected to last >3 days?: No Is the patient deaf or have difficulty hearing?: No Does the patient have difficulty seeing, even when wearing glasses/contacts?: No Does the patient have difficulty concentrating, remembering, or making decisions?: No  Permission Sought/Granted                  Emotional Assessment              Admission diagnosis:  Wound infection [T14.8XXA, L08.9] Sacral osteomyelitis (HCC) [M46.28] Pressure injury of sacral region, unstageable (HCC) [L89.150] Sepsis, due to unspecified organism, unspecified whether acute organ dysfunction present [facility]) [A41.9] Patient Active Problem List   Diagnosis Date Noted   Sacral osteomyelitis (HCC) 10/09/2023   Hypokalemia 10/09/2023   Pressure injury of sacral region, unstageable (HCC) 10/09/2023   DVT (deep venous thrombosis) (HCC) 08/24/2023   Pneumonia 08/24/2023   Protein-calorie malnutrition, severe  08/07/2023   GI bleeding 08/04/2023   SIRS (systemic inflammatory response syndrome) (HCC) 08/04/2023   Acute metabolic encephalopathy 08/04/2023   Fall at home, initial encounter 08/04/2023   AKI (acute kidney injury) (HCC) 08/04/2023   Rhabdomyolysis 08/04/2023   Myocardial injury 08/04/2023   Abnormal LFTs 08/04/2023   HTN (hypertension) 08/04/2023   HLD (hyperlipidemia) 08/04/2023   Stroke (HCC) 08/04/2023   Severe sepsis (HCC) 08/04/2023   UTI (urinary tract infection) 08/04/2023   Alcohol use 08/04/2023   Slurred speech 08/04/2023   Pulmonary nodule 08/04/2023   Gastrointestinal bleed 12/10/2015   Hyponatremia 12/10/2015   Leukopenia 12/10/2015   Hyperglycemia 12/10/2015   Diarrhea 12/10/2015   Acute blood loss anemia 12/09/2015   Exacerbation of ulcerative colitis (HCC) 11/22/2015   Ulcerative colitis, chronic (HCC) 11/22/2015   PCP:  System, Provider Not In Pharmacy:   [facility] [address], [place] - [address] [place] [address] Phone: [phone] Fax: [phone]  [facility] [address] [place] [address] Phone: [phone] Fax: [phone]     Social Determinants of Health (SDOH) Social History: SDOH Screenings   Food  Insecurity: No Food Insecurity (10/10/2023)  Housing: Low Risk  (10/10/2023)  Transportation Needs: No Transportation Needs (10/10/2023)  Recent Concern: Transportation Needs - Unmet Transportation Needs (08/04/2023)  Utilities: Not At Risk (10/10/2023)  Tobacco Use: Low Risk  (10/10/2023)   SDOH Interventions:     Readmission Risk Interventions     No data to display

## 2023-10-14 ENCOUNTER — Other Ambulatory Visit: Payer: Self-pay

## 2023-10-14 DIAGNOSIS — K51011 Ulcerative (chronic) pancolitis with rectal bleeding: Secondary | ICD-10-CM | POA: Diagnosis not present

## 2023-10-14 DIAGNOSIS — L89150 Pressure ulcer of sacral region, unstageable: Secondary | ICD-10-CM | POA: Diagnosis not present

## 2023-10-14 DIAGNOSIS — M4628 Osteomyelitis of vertebra, sacral and sacrococcygeal region: Secondary | ICD-10-CM | POA: Diagnosis not present

## 2023-10-14 DIAGNOSIS — Z515 Encounter for palliative care: Secondary | ICD-10-CM

## 2023-10-14 DIAGNOSIS — L089 Local infection of the skin and subcutaneous tissue, unspecified: Secondary | ICD-10-CM

## 2023-10-14 DIAGNOSIS — T148XXA Other injury of unspecified body region, initial encounter: Secondary | ICD-10-CM

## 2023-10-14 LAB — CULTURE, BLOOD (ROUTINE X 2): Culture: NO GROWTH

## 2023-10-14 MED ORDER — SODIUM CHLORIDE 0.9% FLUSH
10.0000 mL | Freq: Two times a day (BID) | INTRAVENOUS | Status: DC
  Administered 2023-10-15 – 2023-10-17 (×6): 10 mL

## 2023-10-14 MED ORDER — SODIUM CHLORIDE 0.9% FLUSH: 10.0000 mL | INTRAVENOUS | Status: DC | PRN

## 2023-10-14 MED ORDER — CHLORHEXIDINE GLUCONATE CLOTH 2 % EX PADS
6.0000 | MEDICATED_PAD | Freq: Every day | CUTANEOUS | Status: DC
  Administered 2023-10-14 – 2023-10-16 (×3): 6 via TOPICAL

## 2023-10-14 NOTE — Plan of Care (Signed)
  Problem: Health Behavior/Discharge Planning: Goal: Ability to manage health-related needs will improve Outcome: Progressing   

## 2023-10-14 NOTE — Plan of Care (Signed)

## 2023-10-14 NOTE — Progress Notes (Signed)
[facility]  - [place] at [facility]   PATIENT NAME: [name]    MR#:  [id number]  DATE OF BIRTH:  [birth year]  SUBJECTIVE:  no family at bedside. Patient has been yelling loud for help although things he can do by himself. Appears to be still intermittently confused, looks like does not have insight into his problem. I spoke with his girlfriend Kandis Fantasia at [employer]. VITALS:  Blood pressure 102/70, pulse (!) 109, temperature 97.6 F (36.4 C), resp. rate 16, height 5\' 10"  (1.778 m), weight 59.6 kg, SpO2 98%.  PHYSICAL EXAMINATION:   GENERAL:  62 y.o.-year-old patient with no acute distress. Thin cachectic LUNGS: Normal breath sounds bilaterally, CARDIOVASCULAR: S1, S2 normal.  ABDOMEN: Soft, nontender, nondistended.  EXTREMITIES: No  edema b/l.    NEUROLOGIC: nonfocal  patient is alert and awake SKIN:From 10/13/2023  LABORATORY PANEL:  CBC Recent Labs  Lab 10/10/23 0050 10/10/23 1037 10/11/23 0144  WBC 9.6  --   --   HGB 6.6*   < > 10.5*  HCT 20.9*   < > 30.5*  PLT 262  --   --    < > = values in this interval not displayed.    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   His girlfriend at bedside reports that he dismissed physical therapy at the SNF and refuses to  participate.   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 --Concerning for sacral osteomyelitis in setting of chronic bedbound state and refusal to participate with physical therapy at nursing facility --CT abd--no OM noted --Continue with vancomycin and meropenem per ID --Blood cultures x 2  1/2 GPC --await Bone bx --Surgical consult with Dr Maurine Minister-- pt is s/p sacral decubitus debridement in the OR --per gen surgery will need wound vac --ID recommends IV abxs only if pt is going to cooperate with offloading weight/PT at facility---> I did speak with his GF and let her know --palliative care to see -- spoke with patient's girlfriend Kandis Fantasia at length. She understands this wound is very going to be difficult to heal given patient's noncompliance and understanding with lack of insight into his problem. He has been noncompliant with wound VAC at the facility. General surger recommends wet to dry dressings if patient is noncompliant wound VAC. -- Will change to oral antibiotic once determined which one by ID.   severe sepsis  --patient's elevated lactic acid of 2.6, leukocytosis, increased heart rate,  increased respiration rate, source of infection is sacral wound concerning for osteomyelitis --Patient is status post sodium chloride 2 L bolus per EDP --imprvoing   GI bleeding History of GI bleeding secondary to ulcerative colitis   HTN (hypertension) -- blood pressure currently is soft. --trial of midodrine  Hypokalemia potassium repleted   Protein-calorie malnutrition, severe -- resume diet. Dietitian to see patient   Acute blood loss anemia Suspect secondary to sacral decubitus bleeding Baseline hemoglobin is 10.1-11.4 Hemoglobin on admission is 8.8-- 6.6--- three unit blood transfusion-- 7.8--10.9  History of DVT and PE -- eliquis on hold for now -- resume heparin drip six hours postop. -- Discussed with general surgery Dr Georgian Co oozing from  post-op wound ok to resume eliquis   Patient will discharge back to [facility] rehab tomorrow.  Procedures: sacral decubitus debridement Family communication : girlfriend Kandis Fantasia Consults : general surgery, infectious disease CODE STATUS: full DVT Prophylaxis : elequis Level of care: Med-Surg Status is: Inpatient Remains inpatient appropriate because: sepsis due to sacral wound    TOTAL TIME TAKING CARE OF THIS PATIENT: 35 minutes.  >50% time spent on counselling and coordination of care  Note: This dictation was prepared with Dragon dictation along with smaller phrase technology. Any transcriptional errors that result from this process are unintentional.  Enedina Finner M.D    [facility]   CC: Primary care physician; System, Provider Not In

## 2023-10-14 NOTE — TOC Progression Note (Signed)
Transition of Care [facility]) - Progression Note    Patient Details  Name: [name] MRN: [id number] Date of Birth: 09-13-1961  Transition of Care [facility]) CM/SW Contact  Truddie Hidden, RN Phone Number: 10/14/2023, 1:34 PM  Clinical Narrative:    [name] with  Kenney Houseman at [facility] to advise patient  will be returning with wound vac.   11:59AM Retrieved message from Kenney Houseman stating patient already has a wound vac at the facility.           Expected Discharge Plan and Services                                               Social Determinants of Health (SDOH) Interventions SDOH Screenings   Food Insecurity: No Food Insecurity (10/10/2023)  Housing: Low Risk  (10/10/2023)  Transportation Needs: No Transportation Needs (10/10/2023)  Recent Concern: Transportation Needs - Unmet Transportation Needs (08/04/2023)  Utilities: Not At Risk (10/10/2023)  Tobacco Use: Low Risk  (10/10/2023)    Readmission Risk Interventions     No data to display

## 2023-10-14 NOTE — Progress Notes (Signed)
     Referral received for [name] re: goals of care discussion. Chart reviewed and updates received from attending Dr. Allena Katz.   As per attending, no need for PMT to see at this time. Plan for patient to d/c tomorrow, 11/6, with outpatient palliative services to follow at discharge.   PMT will sign off. Please re-consult if goals change, at patient/family's request, or it patient's health deteriorates during hospitalization.    Georgiann Cocker, FNP-BC Palliative Medicine Team   NO CHARGE

## 2023-10-14 NOTE — Progress Notes (Signed)
Peripherally Inserted Central Catheter Placement  The IV Nurse has discussed with the patient and/or persons authorized to consent for the patient, the purpose of this procedure and the potential benefits and risks involved with this procedure.  The benefits include less needle sticks, lab draws from the catheter, and the patient may be discharged home with the catheter. Risks include, but not limited to, infection, bleeding, blood clot (thrombus formation), and puncture of an artery; nerve damage and irregular heartbeat and possibility to perform a PICC exchange if needed/ordered by physician.  Alternatives to this procedure were also discussed.  Bard Power PICC patient education guide, fact sheet on infection prevention and patient information card has been provided to patient /or left at bedside.   Consent obtained with girlfriend at bedside.  PICC Placement Documentation  PICC Single Lumen 10/14/23 Right Brachial 40 cm 0 cm (Active)  Indication for Insertion or Continuance of Line Home intravenous therapies (PICC only) 10/14/23 1800  Exposed Catheter (cm) 0 cm 10/14/23 1800  Site Assessment Clean, Dry, Intact 10/14/23 1800  Line Status Flushed;Saline locked;Blood return noted 10/14/23 1800  Dressing Type Transparent;Securing device 10/14/23 1800  Dressing Status Antimicrobial disc in place;Clean, Dry, Intact 10/14/23 1800  Line Care Connections checked and tightened 10/14/23 1800  Line Adjustment (NICU/IV Team Only) No 10/14/23 1800  Dressing Intervention New dressing 10/14/23 1800  Dressing Change Due 10/21/23 10/14/23 1800       Franne Grip Renee 10/14/2023, 6:14 PM

## 2023-10-15 DIAGNOSIS — M4628 Osteomyelitis of vertebra, sacral and sacrococcygeal region: Secondary | ICD-10-CM | POA: Diagnosis not present

## 2023-10-15 LAB — SURGICAL PATHOLOGY

## 2023-10-15 MED ORDER — SODIUM CHLORIDE 0.9 % IV SOLN
1.0000 g | Freq: Once | INTRAVENOUS | Status: AC
  Administered 2023-10-15: 1 g via INTRAVENOUS
  Filled 2023-10-15: qty 1000

## 2023-10-15 MED ORDER — SODIUM CHLORIDE 0.9 % IV SOLN
1.0000 g | INTRAVENOUS | Status: DC
  Administered 2023-10-16 – 2023-10-17 (×2): 1 g via INTRAVENOUS
  Filled 2023-10-15 (×2): qty 1000

## 2023-10-15 NOTE — Treatment Plan (Signed)
Diagnosis: Stage IV sacral decubitus post debridement  Baseline Creatinine <1  Culture Result: proteus/bacteroides  Allergies  Allergen Reactions   Penicillins Anaphylaxis    10/2023 - patient does not know reaction, just says he is allergic, occurred as child.  Did not require hospitalization.   Has patient had a PCN reaction causing immediate rash, facial/tongue/throat swelling, SOB or lightheadedness with hypotension: Yes Has patient had a PCN reaction causing severe rash involving mucus membranes or skin necrosis: No Has patient had a PCN reaction that required hospitalization No Has patient had a PCN reaction occurring within the last 10 years: No   Cephalosporins Other (See Comments)    Patient unaware of reaction to cephalosporins    OPAT Orders Ertapenem 1 gram IV every 24 hours For 4 weeks End Date: 11/11/23  [facility] Care Per Protocol:  Labs weekly while on IV antibiotics: _X_ CBC with differential  _X_ CMP _X_ CRP _X_ ESR   _X_ Please pull PIC at completion of IV antibiotics   Fax weekly lab results  promptly to [phone]  Clinic Follow Up Appt:with Dr.Eniola Cerullo My chart Video visit 11/11/23 at 10 am  Call [phone] with critical value or questions

## 2023-10-15 NOTE — TOC Progression Note (Signed)
Transition of Care [facility]) - Progression Note    Patient Details  Name: [name] MRN: [id number] Date of Birth: [birth year]  Transition of Care [facility]) CM/SW Contact  Truddie Hidden, RN Phone Number: 10/15/2023, 10:07 AM  Clinical Narrative:    Vesta Mixer for [facility] pending.          Expected Discharge Plan and Services                                               Social Determinants of Health (SDOH) Interventions SDOH Screenings   Food Insecurity: No Food Insecurity (10/10/2023)  Housing: Low Risk  (10/10/2023)  Transportation Needs: No Transportation Needs (10/10/2023)  Recent Concern: Transportation Needs - Unmet Transportation Needs (08/04/2023)  Utilities: Not At Risk (10/10/2023)  Tobacco Use: Low Risk  (10/10/2023)    Readmission Risk Interventions     No data to display

## 2023-10-15 NOTE — Progress Notes (Signed)
PHARMACY CONSULT NOTE FOR:  OUTPATIENT  PARENTERAL ANTIBIOTIC THERAPY (OPAT)  Indication: Stage IV sacral decubitus post debridement Regimen: Ertapenem 1g IV daily  End date: 11/11/23  IV antibiotic discharge orders are pended. To discharging provider:  please sign these orders via discharge navigator,  Select New Orders & click on the button choice - Manage This Unsigned Work.    Labs - Once weekly:  CBC/D, CMP, ESR and CRP Please pull PIC at completion of therapy  Fax weekly labs promptly to [phone]   Thank you for allowing pharmacy to be a part of this patient's care.  Effie Shy, PharmD Pharmacy Resident  10/15/2023 9:03 AM

## 2023-10-15 NOTE — Progress Notes (Signed)
[facility]  - [place] at [facility]   PATIENT NAME: [name]    MR#:  [id number]  DATE OF BIRTH:  03-27-1961  SUBJECTIVE:  no family at bedside. No complaints Tolerating diet  VITALS:  Blood pressure 106/84, pulse 98, temperature 98.5 F (36.9 C), temperature source Oral, resp. rate 18, height 5\' 10"  (1.778 m), weight 59.6 kg, SpO2 99%.  PHYSICAL EXAMINATION:   GENERAL:  62 y.o.-year-old patient with no acute distress. Thin cachectic LUNGS: Normal breath sounds bilaterally, CARDIOVASCULAR: S1, S2 normal.  ABDOMEN: Soft, nontender, nondistended.  EXTREMITIES: No  edema b/l.    NEUROLOGIC: nonfocal  patient is alert and awake SKIN:From 10/13/2023  LABORATORY PANEL:  CBC Recent Labs  Lab 10/10/23 0050 10/10/23 1037 10/11/23 0144  WBC 9.6  --   --   HGB 6.6*   < > 10.5*  HCT 20.9*   < > 30.5*  PLT 262  --   --    < > = values in this interval not displayed.    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   His girlfriend at bedside reports that he dismissed physical therapy at the SNF and refuses to participate.   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 --Concerning for sacral osteomyelitis in setting of chronic bedbound state and refusal to participate  with physical therapy at nursing facility --CT abd--no OM noted Culture growing proteus/bacteroides --Blood cultures x 2  1/2 GPC --Surgical consult with Dr Maurine Minister-- pt is s/p sacral decubitus debridement in the OR --per gen surgery will need wound vac --ID recommends IV abxs only if pt is going to cooperate with offloading weight/PT at facility---> w did speak with his GF and let her know --palliative care to see -- prior provider s with patient's girlfriend Kandis Fantasia at length. She understands this wound is very going to be difficult to heal given patient's noncompliance and understanding with lack of insight into his problem. He has been noncompliant with wound VAC at the facility. General surger recommends wet to dry dressings if patient is noncompliant wound VAC. --ID has placed opat orders today, ertapenem 1 g every 24 hours for 4 weeks, to end on 12/3 with weekly cbc with diff, cmp, crp, esr, pull picc at end of abx, f/u dr. Rivka Safer video visit 12/3 @ 10 am   severe sepsis  --patient's elevated lactic acid of 2.6, leukocytosis, increased heart rate, increased respiration rate, source of infection  is sacral wound concerning for osteomyelitis --Patient is status post sodium chloride 2 L bolus per EDP --sepsis physiology resolved   GI bleeding History of GI bleeding secondary to ulcerative colitis. No report of bleeding currently   HTN (hypertension) -- blood pressure currently is soft. --trial of midodrine, bps stable on that  Hypokalemia potassium repleted but hasn't been checked in 5 days - bmp tomorrow  Protein-calorie malnutrition, severe -- resume diet. Dietitian to see patient   Acute blood loss anemia Suspect secondary to sacral decubitus bleeding Baseline hemoglobin is 10.1-11.4 Hemoglobin on admission is 8.8-- 6.6--- three unit blood transfusion-- 7.8--10.5  History of DVT and PE Apixaban resumed   Procedures: sacral decubitus debridement Family  communication : none at bedside Consults : general surgery, infectious disease CODE STATUS: full DVT Prophylaxis : elequis Level of care: Med-Surg Status is: Inpatient Remains inpatient appropriate because: pending placement (insurance auth pending)    TOTAL TIME TAKING CARE OF THIS PATIENT: 35 minutes.     Silvano Bilis M.D    [facility]

## 2023-10-16 DIAGNOSIS — M4628 Osteomyelitis of vertebra, sacral and sacrococcygeal region: Secondary | ICD-10-CM | POA: Diagnosis not present

## 2023-10-16 LAB — BASIC METABOLIC PANEL
Anion gap: 5 (ref 5–15)
BUN: 11 mg/dL (ref 8–23)
CO2: 25 mmol/L (ref 22–32)
Calcium: 8.1 mg/dL — ABNORMAL LOW (ref 8.9–10.3)
Chloride: 101 mmol/L (ref 98–111)
Creatinine, Ser: 0.39 mg/dL — ABNORMAL LOW (ref 0.61–1.24)
GFR, Estimated: >60 mL/min (ref >60)
Glucose, Bld: 80 mg/dL (ref 70–99)
Potassium: 3.2 mmol/L — ABNORMAL LOW (ref 3.5–5.1)
Sodium: 131 mmol/L — ABNORMAL LOW (ref 135–145)

## 2023-10-16 LAB — CBC
HCT: 26.5 % — ABNORMAL LOW (ref 39.0–52.0)
Hemoglobin: 8.8 g/dL — ABNORMAL LOW (ref 13.0–17.0)
MCH: 27.7 pg (ref 26.0–34.0)
MCHC: 33.2 g/dL (ref 30.0–36.0)
MCV: 83.3 fL (ref 80.0–100.0)
Platelets: 236 10*3/uL (ref 150–400)
RBC: 3.18 MIL/uL — ABNORMAL LOW (ref 4.22–5.81)
RDW: 15.5 % (ref 11.5–15.5)
WBC: 7.1 10*3/uL (ref 4.0–10.5)
nRBC: 0 % (ref 0.0–0.2)

## 2023-10-16 LAB — MAGNESIUM: Magnesium: 1.5 mg/dL — ABNORMAL LOW (ref 1.7–2.4)

## 2023-10-16 MED ORDER — MAGNESIUM SULFATE 2 GM/50ML IV SOLN
2.0000 g | Freq: Once | INTRAVENOUS | Status: AC
  Administered 2023-10-16: 2 g via INTRAVENOUS
  Filled 2023-10-16: qty 50

## 2023-10-16 MED ORDER — POTASSIUM CHLORIDE CRYS ER 20 MEQ PO TBCR
60.0000 meq | EXTENDED_RELEASE_TABLET | Freq: Once | ORAL | Status: AC
  Administered 2023-10-16: 60 meq via ORAL
  Filled 2023-10-16: qty 3

## 2023-10-16 NOTE — Progress Notes (Addendum)
I Patient Vitals for the past 24 hrs:  BP Temp Temp src Pulse Resp SpO2  10/16/23 1627 102/76 (!) 97.5 F (36.4 C) Oral 96 20 99 %  10/16/23 0829 104/80 (!) 97.5 F (36.4 C) Oral 91 16 97 %  10/16/23 0340 100/71 98.2 F (36.8 C) Oral 88 20 97 %  10/15/23 2040 104/74 98.1 F (36.7 C) Oral 88 20 100 %   Picture reviewed   Labs    Latest Ref Rng & Units 10/16/2023    5:07 AM 10/11/2023    1:44 AM 10/10/2023   10:37 AM  CBC  WBC 4.0 - 10.5 K/uL 7.1     Hemoglobin 13.0 - 17.0 g/dL 8.8  16.1  7.8   Hematocrit 39.0 - 52.0 % 26.5  30.5  23.0   Platelets 150 - 400 K/uL 236        Micro Bone culture proteus and bacteroides BC- NG  Impression/recommendation  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 Need wound vac Proteus in culture Currently on meropenem Dc vanco  Patient has PCN allergy - as child No oral antibiotic available as R to cipro, bactrim and cefazolin MIC 8 which makes oral cephalosporin resistant So will continue Meropenem or ertapenem- see OPAT Though futile because of his refusal to be compliant with turning/ work  with PT and poor insight  Anemia  Ulcerative colitis  CVA  H/o rt internal jugular thrombosis /PE and on eliquis   ID will sign off- call if needed

## 2023-10-16 NOTE — Care Management Important Message (Signed)
Important Message  Patient Details  Name: [name] MRN: [id number] Date of Birth: Jul 26, 1961   Important Message Given:  Yes - Medicare IM     Olegario Messier A Shellie Goettl 10/16/2023, 2:34 PM

## 2023-10-16 NOTE — Progress Notes (Signed)
[facility]  - [place] [facility]   PATIENT NAME: [name]    MR#:  [id number]  DATE OF BIRTH:  07-25-1961  SUBJECTIVE:  no family at bedside. No complaints Tolerating diet  VITALS:  Blood pressure 104/80, pulse 91, temperature (!) 97.5 F (36.4 C), temperature source Oral, resp. rate 16, height 5\' 10"  (1.778 m), weight 59.6 kg, SpO2 97%.  PHYSICAL EXAMINATION:   GENERAL:  62 y.o.-year-old patient with no acute distress. Thin cachectic LUNGS: Normal breath sounds bilaterally, CARDIOVASCULAR: S1, S2 normal.  ABDOMEN: Soft, nontender, nondistended.  EXTREMITIES: No  edema b/l.    NEUROLOGIC: nonfocal  patient is alert and awake SKIN:From 10/13/2023  LABORATORY PANEL:  CBC Recent Labs  Lab 10/16/23 0507  WBC 7.1  HGB 8.8*  HCT 26.5*  PLT 236    Chemistries  Recent Labs  Lab 10/10/23 0050 10/10/23 1037 10/16/23 0507  NA 132*   < > 131*  K 2.8*   < > 3.2*  CL 100   < > 101  CO2 28  --  25  GLUCOSE 97   < > 80  BUN 20   < > 11  CREATININE 0.82   < > 0.39*  CALCIUM 7.4*  --  8.1*  MG 1.3*  --   --    < > = values in this interval not displayed.    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   His girlfriend at bedside reports that he dismissed physical therapy at the SNF and refuses to participate.   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 She understands this wound is very going to be difficult to heal given patient's noncompliance and understanding with lack of insight into his problem. He has been noncompliant with wound VAC at the facility. General surger recommends wet to dry dressings if patient is noncompliant wound VAC. --ID has placed opat orders today, ertapenem 1 g every 24 hours for 4 weeks, to end on 12/3 with weekly cbc with diff, cmp, crp, esr, pull picc at end of abx, f/u dr. Rivka Safer video visit 12/3 @ 10 am   severe sepsis  --patient's elevated lactic acid of 2.6, leukocytosis, increased heart rate, increased respiration rate, source of infection is sacral wound concerning for osteomyelitis --Patient is status post sodium chloride 2 L bolus per EDP --sepsis physiology resolved   GI bleeding History of GI bleeding secondary to ulcerative colitis. No report of bleeding currently   HTN (hypertension) -- blood pressure currently is soft. --trial of midodrine, bps stable on that  Hypokalemia Replete, f/u mg  Protein-calorie malnutrition, severe RD has seen   Acute blood loss anemia Suspect secondary to sacral decubitus bleeding Baseline hemoglobin is 10.1-11.4 Hemoglobin on  admission is 8.8-- 6.6--- three unit blood transfusion-- 7.8--10.5  History of DVT and PE Continue apixaban   Procedures: sacral decubitus debridement Family communication : girlfriend updated telephonically 11/7. Has a brother Consults : general surgery, infectious disease CODE STATUS: full DVT Prophylaxis : elequis Level of care: Med-Surg Status is: Inpatient Remains inpatient appropriate because: pending placement (insurance auth pending)    TOTAL TIME TAKING CARE OF THIS  PATIENT: 35 minutes.     Silvano Bilis M.D    [facility]

## 2023-10-17 DIAGNOSIS — L89616 Pressure-induced deep tissue damage of right heel: Secondary | ICD-10-CM | POA: Diagnosis not present

## 2023-10-17 DIAGNOSIS — R278 Other lack of coordination: Secondary | ICD-10-CM | POA: Diagnosis not present

## 2023-10-17 DIAGNOSIS — R634 Abnormal weight loss: Secondary | ICD-10-CM | POA: Diagnosis not present

## 2023-10-17 DIAGNOSIS — M8610 Other acute osteomyelitis, unspecified site: Secondary | ICD-10-CM | POA: Diagnosis not present

## 2023-10-17 DIAGNOSIS — S31000A Unspecified open wound of lower back and pelvis without penetration into retroperitoneum, initial encounter: Secondary | ICD-10-CM | POA: Diagnosis not present

## 2023-10-17 DIAGNOSIS — R6510 Systemic inflammatory response syndrome (SIRS) of non-infectious origin without acute organ dysfunction: Secondary | ICD-10-CM | POA: Diagnosis not present

## 2023-10-17 DIAGNOSIS — N179 Acute kidney failure, unspecified: Secondary | ICD-10-CM | POA: Diagnosis not present

## 2023-10-17 DIAGNOSIS — M6281 Muscle weakness (generalized): Secondary | ICD-10-CM | POA: Diagnosis not present

## 2023-10-17 DIAGNOSIS — K5190 Ulcerative colitis, unspecified, without complications: Secondary | ICD-10-CM | POA: Diagnosis not present

## 2023-10-17 DIAGNOSIS — R600 Localized edema: Secondary | ICD-10-CM | POA: Diagnosis not present

## 2023-10-17 DIAGNOSIS — E785 Hyperlipidemia, unspecified: Secondary | ICD-10-CM | POA: Diagnosis not present

## 2023-10-17 DIAGNOSIS — J811 Chronic pulmonary edema: Secondary | ICD-10-CM | POA: Diagnosis not present

## 2023-10-17 DIAGNOSIS — I82409 Acute embolism and thrombosis of unspecified deep veins of unspecified lower extremity: Secondary | ICD-10-CM | POA: Diagnosis not present

## 2023-10-17 DIAGNOSIS — D649 Anemia, unspecified: Secondary | ICD-10-CM | POA: Diagnosis not present

## 2023-10-17 DIAGNOSIS — L89626 Pressure-induced deep tissue damage of left heel: Secondary | ICD-10-CM | POA: Diagnosis not present

## 2023-10-17 DIAGNOSIS — E876 Hypokalemia: Secondary | ICD-10-CM | POA: Diagnosis not present

## 2023-10-17 DIAGNOSIS — E871 Hypo-osmolality and hyponatremia: Secondary | ICD-10-CM | POA: Diagnosis not present

## 2023-10-17 DIAGNOSIS — M791 Myalgia, unspecified site: Secondary | ICD-10-CM | POA: Diagnosis not present

## 2023-10-17 DIAGNOSIS — S51011D Laceration without foreign body of right elbow, subsequent encounter: Secondary | ICD-10-CM | POA: Diagnosis not present

## 2023-10-17 DIAGNOSIS — Z8673 Personal history of transient ischemic attack (TIA), and cerebral infarction without residual deficits: Secondary | ICD-10-CM | POA: Diagnosis not present

## 2023-10-17 DIAGNOSIS — S01411D Laceration without foreign body of right cheek and temporomandibular area, subsequent encounter: Secondary | ICD-10-CM | POA: Diagnosis not present

## 2023-10-17 DIAGNOSIS — I10 Essential (primary) hypertension: Secondary | ICD-10-CM | POA: Diagnosis not present

## 2023-10-17 DIAGNOSIS — L89516 Pressure-induced deep tissue damage of right ankle: Secondary | ICD-10-CM | POA: Diagnosis not present

## 2023-10-17 DIAGNOSIS — I2699 Other pulmonary embolism without acute cor pulmonale: Secondary | ICD-10-CM | POA: Diagnosis not present

## 2023-10-17 DIAGNOSIS — E43 Unspecified severe protein-calorie malnutrition: Secondary | ICD-10-CM | POA: Diagnosis not present

## 2023-10-17 DIAGNOSIS — G4700 Insomnia, unspecified: Secondary | ICD-10-CM | POA: Diagnosis not present

## 2023-10-17 DIAGNOSIS — L89150 Pressure ulcer of sacral region, unstageable: Secondary | ICD-10-CM | POA: Diagnosis not present

## 2023-10-17 DIAGNOSIS — R7989 Other specified abnormal findings of blood chemistry: Secondary | ICD-10-CM | POA: Diagnosis not present

## 2023-10-17 DIAGNOSIS — R0989 Other specified symptoms and signs involving the circulatory and respiratory systems: Secondary | ICD-10-CM | POA: Diagnosis not present

## 2023-10-17 DIAGNOSIS — M4628 Osteomyelitis of vertebra, sacral and sacrococcygeal region: Secondary | ICD-10-CM | POA: Diagnosis not present

## 2023-10-17 DIAGNOSIS — I5A Non-ischemic myocardial injury (non-traumatic): Secondary | ICD-10-CM | POA: Diagnosis not present

## 2023-10-17 DIAGNOSIS — G9341 Metabolic encephalopathy: Secondary | ICD-10-CM | POA: Diagnosis not present

## 2023-10-17 DIAGNOSIS — L89526 Pressure-induced deep tissue damage of left ankle: Secondary | ICD-10-CM | POA: Diagnosis not present

## 2023-10-17 DIAGNOSIS — M6282 Rhabdomyolysis: Secondary | ICD-10-CM | POA: Diagnosis not present

## 2023-10-17 DIAGNOSIS — E559 Vitamin D deficiency, unspecified: Secondary | ICD-10-CM | POA: Diagnosis not present

## 2023-10-17 DIAGNOSIS — R1312 Dysphagia, oropharyngeal phase: Secondary | ICD-10-CM | POA: Diagnosis not present

## 2023-10-17 LAB — BASIC METABOLIC PANEL
Anion gap: 7 (ref 5–15)
BUN: 12 mg/dL (ref 8–23)
CO2: 25 mmol/L (ref 22–32)
Calcium: 8.4 mg/dL — ABNORMAL LOW (ref 8.9–10.3)
Chloride: 100 mmol/L (ref 98–111)
Creatinine, Ser: 0.51 mg/dL — ABNORMAL LOW (ref 0.61–1.24)
GFR, Estimated: >60 mL/min (ref >60)
Glucose, Bld: 82 mg/dL (ref 70–99)
Potassium: 4.1 mmol/L (ref 3.5–5.1)
Sodium: 132 mmol/L — ABNORMAL LOW (ref 135–145)

## 2023-10-17 LAB — CBC
HCT: 27.9 % — ABNORMAL LOW (ref 39.0–52.0)
Hemoglobin: 9.2 g/dL — ABNORMAL LOW (ref 13.0–17.0)
MCH: 27.1 pg (ref 26.0–34.0)
MCHC: 33 g/dL (ref 30.0–36.0)
MCV: 82.1 fL (ref 80.0–100.0)
Platelets: 286 10*3/uL (ref 150–400)
RBC: 3.4 MIL/uL — ABNORMAL LOW (ref 4.22–5.81)
RDW: 16 % — ABNORMAL HIGH (ref 11.5–15.5)
WBC: 7.3 10*3/uL (ref 4.0–10.5)
nRBC: 0 % (ref 0.0–0.2)

## 2023-10-17 LAB — MAGNESIUM: Magnesium: 1.9 mg/dL (ref 1.7–2.4)

## 2023-10-17 MED ORDER — ERTAPENEM IV (FOR PTA / DISCHARGE USE ONLY): 1.0000 g | INTRAVENOUS | 0 refills | Status: AC

## 2023-10-17 MED ORDER — MIDODRINE HCL 5 MG PO TABS: 5.0000 mg | ORAL_TABLET | Freq: Three times a day (TID) | ORAL | Status: DC

## 2023-10-17 NOTE — Consult Note (Signed)
[facility] Liaison Note  10/17/2023  [name] 1961/02/27 [id number]  Chart was screened due to [place].  Location: [facility] Liaison screened the patient remotely at [facility].  Insurance: [employer]   [name] is a 62 y.o. male who is a Primary Care Patient of System, Provider Not In The patient was screened for readmission hospitalization with noted high risk score for unplanned readmission risk with 3 IP/1 ED in 6 months.  The patient was assessed for potential Care Management service needs for post hospital transition for care coordination. Review of patient's electronic medical record reveals patient was admitted for Sacral Osteomyelitis. Pt will discharged to SNF level of care. Facility will continue to address pt's needs. No provider listed for this pt for community care management services.   [facility] Care Management/Population Health does not replace or interfere with any arrangements made by the Inpatient Transition of Care team.   For questions contact:   [name], RN, [facility] Liaison [place]   [facility], Population Health Office Hours MTWF  8:00 am-6:00 pm Direct Dial: [phone] mobile [phone] [Office toll free line] Office Hours are M-F 8:30 - 5 pm [email or web address]

## 2023-10-17 NOTE — Plan of Care (Signed)

## 2023-10-17 NOTE — Progress Notes (Signed)
Approximately 1700--Pt left via EMS to be transported to [facility] SNF. Per off-going RN, report called earlier to facility with all questions answered. Pt transported with RUE PICC in place, site WDL. All pt belongings transferred with pt. Hand-off provided to EMS transport. VSS.

## 2023-10-17 NOTE — TOC Transition Note (Addendum)
Transition of Care [facility]) - CM/SW Discharge Note   Patient Details  Name: [name] MRN: [id number] Date of Birth: 1961/07/03  [facility]) CM/SW Contact:  Liliana Cline, LCSW Phone Number: 10/17/2023, 11:28 AM   Clinical Narrative:    Discharge to [facility] today. Room 78B. Confirmed with Admissions Worker Tanya. Asked RN to call report. EMS paperwork completed.  12:17- Notified by MD that patient's family is requesting [facility] to follow at [facility]. Ree Kida with [facility] and Kenney Houseman at [facility] notified.  12:21- [facility] called for transport.    Final next level of care: Skilled Nursing Facility Barriers to Discharge: Barriers Resolved   Patient Goals and CMS Choice      Discharge Placement                Patient chooses bed at: [facility] Patient to be transferred to facility by: [facility]      Discharge Plan and Services Additional resources added to the After Visit Summary for                                       Social Determinants of Health (SDOH) Interventions SDOH Screenings   Food Insecurity: No Food Insecurity (10/10/2023)  Housing: Low Risk  (10/10/2023)  Transportation Needs: No Transportation Needs (10/10/2023)  Recent Concern: Transportation Needs - Unmet Transportation Needs (08/04/2023)  Utilities: Not At Risk (10/10/2023)  Tobacco Use: Low Risk  (10/10/2023)     Readmission Risk Interventions     No data to display

## 2023-10-17 NOTE — TOC Progression Note (Signed)
Transition of Care [facility]) - Progression Note    Patient Details  Name: [name] MRN: [id number] Date of Birth: 1961-03-13  [facility] CM/SW Contact  Liliana Cline, LCSW Phone Number: 10/17/2023, 9:49 AM  Clinical Narrative:    Auth approved in [place]. Per MD, patient is medically ready. Left VM for Tanya at [facility] to see if they can accept patient today.         Expected Discharge Plan and Services                                               Social Determinants of Health (SDOH) Interventions SDOH Screenings   Food Insecurity: No Food Insecurity (10/10/2023)  Housing: Low Risk  (10/10/2023)  Transportation Needs: No Transportation Needs (10/10/2023)  Recent Concern: Transportation Needs - Unmet Transportation Needs (08/04/2023)  Utilities: Not At Risk (10/10/2023)  Tobacco Use: Low Risk  (10/10/2023)    Readmission Risk Interventions     No data to display

## 2023-10-17 NOTE — Discharge Summary (Signed)
[name] UJW:[id number] DOB: 07-Oct-1961 DOA: 10/09/2023  PCP: System, Provider Not In  Admit date: 10/09/2023 Discharge date: 10/17/2023  Time spent: 35 minutes  Recommendations for Outpatient Follow-up:  F/u video visit dr. Rivka Safer 12/3 Establish with hospice    Discharge Diagnoses:  Principal Problem:   Sacral osteomyelitis (HCC) Active Problems:   SIRS (systemic inflammatory response syndrome) (HCC)   GI bleeding   HTN (hypertension)   HLD (hyperlipidemia)   Ulcerative colitis, chronic (HCC)   Acute blood loss anemia   Protein-calorie malnutrition, severe   Hypokalemia   Pressure injury of sacral region, unstageable [facility])   Discharge Condition: stable  Diet recommendation: heart healthy  Filed Weights   10/09/23 1137  Weight: 59.6 kg    History of present illness:  From admission h and p [name] is a 62 year old male with history of hypertension, hyperlipidemia, stroke, bedbound state, 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   At bedside, he is able to tell me his name, age, current location, current calendar    He denies fever, nausea, vomtiing.  He reports he has been struggling with his wounds for a long time.   His girlfriend at bedside reports that he dismissed physical therapy at the SNF and refuses to participate.   He denies chest pain, shortness of breath, abdominal pain, dysuria, hematuria, fever, chills, nausea, vomiting.    Hospital Course:  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 --Concerning for sacral osteomyelitis in setting of chronic bedbound state and refusal to participate with physical therapy at nursing facility --CT abd--no OM noted Culture growing proteus/bacteroides --Blood cultures x 2  1/2 GPC --Surgical consult with Dr Maurine Minister-- pt is s/p sacral  decubitus debridement in the OR --per gen surgery will need wound vac --ID recommends IV abxs only if pt is going to cooperate with offloading weight/PT at facility---> w did speak with his GF and let her know -- prior provider s with patient's girlfriend Kandis Fantasia at length. She understands this wound is very going to be difficult to heal given patient's noncompliance and understanding with lack of insight into his problem. He has been noncompliant with wound VAC at the facility. General surger recommends wet to dry dressings if patient is noncompliant wound VAC. --ID has placed opat orders today, ertapenem 1 g every 24 hours for 4 weeks, to end on 12/3 with weekly cbc with diff, cmp, crp, esr, pull picc at end of abx, f/u dr. Rivka Safer video visit 12/3 @ 10 am. Though dr. Rivka Safer describes as "futile" further antibiotic treatment if patient persists in unwillingness to move, participate in therapies, etc. - patient appears ready for comfort care approach and girlfriend agrees. Hospice to follow as outpatient   severe sepsis  --patient's elevated lactic acid of 2.6, leukocytosis, increased heart rate, increased respiration rate, source of infection is sacral wound concerning for osteomyelitis --Patient is status post sodium chloride 2 L bolus per EDP --sepsis physiology resolved   GI bleeding History of GI bleeding secondary to ulcerative colitis. No report of bleeding currently   HTN (hypertension) -- blood pressure currently is soft. --trial of midodrine, bps stable on that   Hypokalemia Replete, f/u mg   Protein-calorie malnutrition, severe RD has seen   Acute blood loss anemia Suspect secondary to sacral decubitus bleeding Baseline hemoglobin is 10.1-11.4 Hemoglobin on admission is 8.8-- 6.6--- three unit  blood transfusion-- 7.8--10.5 and stable   History of DVT and PE Continue apixaban  Procedures: debridement   Consultations: Gen surg, ID  Discharge  Exam: Vitals:   10/17/23 0355 10/17/23 0831  BP: 105/77 108/72  Pulse: 85 99  Resp: 18 16  Temp: 98.5 F (36.9 C) 97.9 F (36.6 C)  SpO2: 99% 98%    General: chronically ill appearing Cardiovascular: rrr no murmur Respiratory: rales at bases otherwise clear Ext: warm, no edema  Discharge Instructions   Discharge Instructions     [facility] pharmacist to adjust dose for Vancomycin, Aminoglycosides and other anti-infective therapies as requested by physician.   Complete by: As directed    [facility] to provide Cath Flo 2mg    Complete by: As directed    Administer for PICC line occlusion and as ordered by physician for other access device issues.   Anaphylaxis Kit: Provided to treat any anaphylactic reaction to the medication being provided to the patient if First Dose or when requested by physician   Complete by: As directed    Epinephrine 1mg /ml vial / amp: Administer 0.3mg  (0.82ml) subcutaneously once for moderate to severe anaphylaxis, nurse to call physician and pharmacy when reaction occurs and call 911 if needed for immediate care   Diphenhydramine 50mg /ml IV vial: Administer 25-50mg  IV/IM PRN for first dose reaction, rash, itching, mild reaction, nurse to call physician and pharmacy when reaction occurs   Sodium Chloride 0.9% NS IV: Administer if needed for hypovolemic blood pressure drop or as ordered by physician after call to physician with anaphylactic reaction   Call MD for:   Complete by: As directed    Call MD for:  difficulty breathing, headache or visual disturbances   Complete by: As directed    Call MD for:  extreme fatigue   Complete by: As directed    Call MD for:  hives   Complete by: As directed    Call MD for:  persistant dizziness or light-headedness   Complete by: As directed    Call MD for:  persistant nausea and vomiting   Complete by: As directed    Call MD for:  severe uncontrolled pain   Complete by: As directed     Call MD for:  temperature >100.4   Complete by: As directed    Change dressing on IV access line weekly and PRN   Complete by: As directed    Diet general   Complete by: As directed    Flush IV access with Sodium Chloride 0.9% and Heparin 10 units/ml or 100 units/ml   Complete by: As directed    Home infusion instructions - [facility]   Complete by: As directed    Instructions: Flush IV access with Sodium Chloride 0.9% and Heparin 10units/ml or 100units/ml   Change dressing on IV access line: Weekly and PRN   Instructions Cath Flo 2mg : Administer for PICC Line occlusion and as ordered by physician for other access device   [facility] pharmacist to adjust dose for: Vancomycin, Aminoglycosides and other anti-infective therapies as requested by physician   Method of administration may be changed at the discretion of home infusion pharmacist based upon assessment of the patient and/or caregiver's ability to self-administer the medication ordered   Complete by: As directed       Allergies as of 10/17/2023       Reactions   Penicillins Anaphylaxis   10/2023 - patient does not know reaction, just says  he is allergic, occurred as child.  Did not require hospitalization.   Has patient had a PCN reaction causing immediate rash, facial/tongue/throat swelling, SOB or lightheadedness with hypotension: Yes Has patient had a PCN reaction causing severe rash involving mucus membranes or skin necrosis: No Has patient had a PCN reaction that required hospitalization No Has patient had a PCN reaction occurring within the last 10 years: No   Cephalosporins Other (See Comments)   Patient unaware of reaction to cephalosporins        Medication List     STOP taking these medications    clopidogrel 75 MG tablet Commonly known as: PLAVIX   collagenase 250 UNIT/GM ointment Commonly known as: SANTYL   metoprolol succinate 25 MG 24 hr tablet Commonly known as: TOPROL-XL    mupirocin ointment 2 % Commonly known as: BACTROBAN       TAKE these medications    (feeding supplement) PROSource Plus liquid Take 30 mLs by mouth 2 (two) times daily between meals.   apixaban 5 MG Tabs tablet Commonly known as: ELIQUIS Take 2 tablets (10 mg total) by mouth 2 (two) times daily for 6 days, THEN 1 tablet (5 mg total) 2 (two) times daily. Start taking on: August 28, 2023 What changed: See the new instructions.   ascorbic acid 500 MG tablet Commonly known as: VITAMIN C Take 1 tablet (500 mg total) by mouth 2 (two) times daily.   balsalazide 750 MG capsule Commonly known as: COLAZAL Take 750 mg by mouth 3 (three) times daily.   ertapenem IVPB Commonly known as: INVANZ Inject 1 g into the vein daily for 27 days. Indication:  Stage IV sacral decubitus post debridement Last Day of Therapy:  11/11/23 Labs - Once weekly:  CBC/D, CMP, ESR and CRP Please pull PIC at completion of therapy  Fax weekly labs promptly to [phone] Method of administration: Mini-Bag Plus / Gravity Method of administration may be changed at the discretion of facility or pharmacy   folic acid 1 MG tablet Commonly known as: FOLVITE Take 1 tablet (1 mg total) by mouth daily.   midodrine 5 MG tablet Commonly known as: PROAMATINE Take 1 tablet (5 mg total) by mouth 3 (three) times daily with meals.   multivitamin with minerals Tabs tablet Take 1 tablet by mouth daily.   thiamine 100 MG tablet Commonly known as: Vitamin B-1 Take 1 tablet (100 mg total) by mouth daily.   traZODone 100 MG tablet Commonly known as: DESYREL Take 100 mg by mouth at bedtime as needed for sleep.   Vitamin D3 125 MCG (5000 UT) Tabs Take 5,000 Units by mouth daily.   Zinc Sulfate 220 (50 Zn) MG Tabs Take 1 tablet (220 mg total) by mouth daily.               Discharge Care Instructions  (From admission, onward)           Start     Ordered   10/17/23 0000  Change dressing on IV  access line weekly and PRN  (Home infusion instructions - [facility] )        10/17/23 1153           Allergies  Allergen Reactions   Penicillins Anaphylaxis    10/2023 - patient does not know reaction, just says he is allergic, occurred as child.  Did not require hospitalization.   Has patient had a PCN reaction causing immediate rash, facial/tongue/throat swelling, SOB or lightheadedness with  hypotension: Yes Has patient had a PCN reaction causing severe rash involving mucus membranes or skin necrosis: No Has patient had a PCN reaction that required hospitalization No Has patient had a PCN reaction occurring within the last 10 years: No   Cephalosporins Other (See Comments)    Patient unaware of reaction to cephalosporins      The results of significant diagnostics from this hospitalization (including imaging, microbiology, ancillary and laboratory) are listed below for reference.    Significant Diagnostic Studies: [place] EKG SITE RITE  Result Date: 10/14/2023 If Site Rite image not attached, placement could not be confirmed due to current cardiac rhythm.  CT ABDOMEN PELVIS W CONTRAST  Result Date: 10/09/2023 CLINICAL DATA:  Sepsis. Sacral decubitus ulcer. Ulcerative colitis. EXAM: CT ABDOMEN AND PELVIS WITH CONTRAST TECHNIQUE: Multidetector CT imaging of the abdomen and pelvis was performed using the standard protocol following bolus administration of intravenous contrast. RADIATION DOSE REDUCTION: This exam was performed according to the departmental dose-optimization program which includes automated exposure control, adjustment of the mA and/or kV according to patient size and/or use of iterative reconstruction technique. CONTRAST:  OMNIPAQUE IOHEXOL 300 MG/ML  SOLN COMPARISON:  08/25/2023 FINDINGS: Lower Chest: Tiny left pleural effusion and mild dependent atelectasis. Hepatobiliary: No suspicious hepatic masses identified. Gallbladder is unremarkable. No evidence  of biliary ductal dilatation. Pancreas:  No mass or inflammatory changes. Spleen: Within normal limits in size and appearance. Adrenals/Urinary Tract: No suspicious masses identified. No evidence of ureteral calculi or hydronephrosis. Stomach/Bowel: Mild diffuse colonic wall thickening and mucosal hyperenhancement is again seen, consistent with mild colitis. No evidence of involvement of terminal ileum or other small bowel. No evidence of bowel obstruction or abscess. Vascular/Lymphatic: No pathologically enlarged lymph nodes. No acute vascular findings. Congenital left-sided IVC again noted. Reproductive:  No mass or other significant abnormality. Other:  None. Musculoskeletal: Large decubitus ulcer is seen along the right posterior aspect of the lower sacrum. No definite evidence of sacral osteomyelitis or other suspicious bone lesions. IMPRESSION: Mild diffuse colitis, consistent with known diagnosis of ulcerative colitis. No evidence of bowel obstruction, perforation, or abscess. Large decubitus ulcer along right posterior aspect of lower sacrum. No definite evidence of sacral osteomyelitis. Tiny left pleural effusion and mild dependent atelectasis. Electronically Signed   By: Danae Orleans M.D.   On: 10/09/2023 15:12   DG Chest Port 1 View  Result Date: 10/09/2023 CLINICAL DATA:  Sepsis. EXAM: PORTABLE CHEST 1 VIEW COMPARISON:  Chest radiograph dated August 04, 2023 FINDINGS: The heart size and mediastinal contours are within normal limits. Both lungs are clear. No pneumothorax or sizable pleural effusion. No acute osseous abnormality. IMPRESSION: No acute cardiopulmonary findings. Electronically Signed   By: Hart Robinsons M.D.   On: 10/09/2023 14:37    Microbiology: Recent Results (from the past 240 hour(s))  Culture, blood (Routine x 2)     Status: None   Collection Time: 10/09/23 11:44 AM   Specimen: BLOOD  Result Value Ref Range Status   Specimen Description BLOOD RIGHT ANTECUBITAL  Final    Special Requests   Final    BOTTLES DRAWN AEROBIC AND ANAEROBIC Blood Culture results may not be optimal due to an excessive volume of blood received in culture bottles   Culture   Final    NO GROWTH 5 DAYS Performed at [facility], [address] [place], [place] [address]    Report Status 10/14/2023 FINAL  Final  Culture, blood (Routine x 2)  Status: Abnormal   Collection Time: 10/09/23 12:03 PM   Specimen: BLOOD  Result Value Ref Range Status   Specimen Description   Final    BLOOD BLOOD LEFT FOREARM Performed at [facility], [address] [place], [place] [address]    Special Requests   Final    BOTTLES DRAWN AEROBIC AND ANAEROBIC Blood Culture results may not be optimal due to an inadequate volume of blood received in culture bottles Performed at [facility], [address] [place], [place] [address]    Culture  Setup Time   Final    GRAM POSITIVE COCCI ANAEROBIC BOTTLE ONLY CRITICAL RESULT CALLED TO, READ BACK BY AND VERIFIED WITH: NATHAN BELUE AT 0518 10/10/23.PMF    Culture (A)  Final    STAPHYLOCOCCUS HAEMOLYTICUS STAPHYLOCOCCUS EPIDERMIDIS THE SIGNIFICANCE OF ISOLATING THIS ORGANISM FROM A SINGLE SET OF BLOOD CULTURES WHEN MULTIPLE SETS ARE DRAWN IS UNCERTAIN. PLEASE NOTIFY THE MICROBIOLOGY DEPARTMENT WITHIN ONE WEEK IF SPECIATION AND SENSITIVITIES ARE REQUIRED. Performed at [facility], [address] [place], [place] [address]    Report Status 10/12/2023 FINAL  Final  Blood Culture ID Panel (Reflexed)     Status: Abnormal   Collection Time: 10/09/23 12:03 PM  Result Value Ref Range Status   Enterococcus faecalis NOT DETECTED NOT DETECTED Final   Enterococcus Faecium NOT DETECTED NOT DETECTED Final   Listeria monocytogenes NOT DETECTED NOT DETECTED Final   Staphylococcus species DETECTED (A) NOT DETECTED Final    Comment: CRITICAL RESULT CALLED TO, READ BACK BY AND VERIFIED WITH: NATHAN BELUE AT 0518 10/10/23.PMF     Staphylococcus aureus (BCID) NOT DETECTED NOT DETECTED Final   Staphylococcus epidermidis DETECTED (A) NOT DETECTED Final    Comment: Methicillin (oxacillin) resistant coagulase negative staphylococcus. Possible blood culture contaminant (unless isolated from more than one blood culture draw or clinical case suggests pathogenicity). No antibiotic treatment is indicated for blood  culture contaminants. CRITICAL RESULT CALLED TO, READ BACK BY AND VERIFIED WITH: NATHAN BELUE AT 0518 10/10/23.PMF    Staphylococcus lugdunensis NOT DETECTED NOT DETECTED Final   Streptococcus species NOT DETECTED NOT DETECTED Final   Streptococcus agalactiae NOT DETECTED NOT DETECTED Final   Streptococcus pneumoniae NOT DETECTED NOT DETECTED Final   Streptococcus pyogenes NOT DETECTED NOT DETECTED Final   A.calcoaceticus-baumannii NOT DETECTED NOT DETECTED Final   Bacteroides fragilis NOT DETECTED NOT DETECTED Final   Enterobacterales NOT DETECTED NOT DETECTED Final   Enterobacter cloacae complex NOT DETECTED NOT DETECTED Final   Escherichia coli NOT DETECTED NOT DETECTED Final   Klebsiella aerogenes NOT DETECTED NOT DETECTED Final   Klebsiella oxytoca NOT DETECTED NOT DETECTED Final   Klebsiella pneumoniae NOT DETECTED NOT DETECTED Final   Proteus species NOT DETECTED NOT DETECTED Final   Salmonella species NOT DETECTED NOT DETECTED Final   Serratia marcescens NOT DETECTED NOT DETECTED Final   Haemophilus influenzae NOT DETECTED NOT DETECTED Final   Neisseria meningitidis NOT DETECTED NOT DETECTED Final   Pseudomonas aeruginosa NOT DETECTED NOT DETECTED Final   Stenotrophomonas maltophilia NOT DETECTED NOT DETECTED Final   Candida albicans NOT DETECTED NOT DETECTED Final   Candida auris NOT DETECTED NOT DETECTED Final   Candida glabrata NOT DETECTED NOT DETECTED Final   Candida krusei NOT DETECTED NOT DETECTED Final   Candida parapsilosis NOT DETECTED NOT DETECTED Final   Candida tropicalis NOT DETECTED NOT  DETECTED Final   Cryptococcus neoformans/gattii NOT DETECTED NOT DETECTED Final   Methicillin resistance mecA/C DETECTED (A)  NOT DETECTED Final    Comment: CRITICAL RESULT CALLED TO, READ BACK BY AND VERIFIED WITH: NATHAN BELUE AT 0518 10/10/23.PMF Performed at [facility], [address], [place], [place] [address]   Aerobic/Anaerobic Culture w Gram Stain (surgical/deep wound)     Status: None   Collection Time: 10/10/23 11:46 AM   Specimen: Bone; Tissue  Result Value Ref Range Status   Specimen Description   Final    BONE Performed at [facility], [address] [place], [place] [address]    Special Requests SACRAL DC ULCER  Final   Gram Stain   Final    RARE WBC PRESENT,BOTH PMN AND MONONUCLEAR RARE GRAM POSITIVE COCCI IN PAIRS RARE GRAM NEGATIVE RODS    Culture   Final    FEW PROTEUS MIRABILIS RARE BACTEROIDES OVATUS BETA LACTAMASE POSITIVE Performed at [facility], [address], [place], [place] [address]    Report Status 10/13/2023 FINAL  Final   Organism ID, Bacteria PROTEUS MIRABILIS  Final      Susceptibility   Proteus mirabilis - MIC*    AMPICILLIN >=32 RESISTANT Resistant     CEFEPIME <=0.12 SENSITIVE Sensitive     CEFTAZIDIME <=1 SENSITIVE Sensitive     CEFTRIAXONE <=0.25 SENSITIVE Sensitive     CIPROFLOXACIN >=4 RESISTANT Resistant     GENTAMICIN <=1 SENSITIVE Sensitive     IMIPENEM 4 SENSITIVE Sensitive     TRIMETH/SULFA >=320 RESISTANT Resistant     AMPICILLIN/SULBACTAM 4 SENSITIVE Sensitive     PIP/TAZO <=4 SENSITIVE Sensitive ug/mL    * FEW PROTEUS MIRABILIS  MRSA Next Gen by PCR, Nasal     Status: None   Collection Time: 10/10/23  4:51 PM   Specimen: Nasal Mucosa; Nasal Swab  Result Value Ref Range Status   MRSA by PCR Next Gen NOT DETECTED NOT DETECTED Final    Comment: (NOTE) The GeneXpert MRSA Assay (FDA approved for NASAL specimens only), is one component of a comprehensive MRSA colonization surveillance program.  It is not intended to diagnose MRSA infection nor to guide or monitor treatment for MRSA infections. Test performance is not FDA approved in patients less than 30 years old. Performed at [facility], [address] [place], [place] [address]      Labs: Basic Metabolic Panel: Recent Labs  Lab 10/13/23 0430 10/16/23 0507 10/17/23 0540  NA  --  131* 132*  K  --  3.2* 4.1  CL  --  101 100  CO2  --  25 25  GLUCOSE  --  80 82  BUN  --  11 12  CREATININE 0.42* 0.39* 0.51*  CALCIUM  --  8.1* 8.4*  MG  --  1.5* 1.9   Liver Function Tests: No results for input(s): "AST", "ALT", "ALKPHOS", "BILITOT", "PROT", "ALBUMIN" in the last 168 hours. No results for input(s): "LIPASE", "AMYLASE" in the last 168 hours. No results for input(s): "AMMONIA" in the last 168 hours. CBC: Recent Labs  Lab 10/11/23 0144 10/16/23 0507 10/17/23 0540  WBC  --  7.1 7.3  HGB 10.5* 8.8* 9.2*  HCT 30.5* 26.5* 27.9*  MCV  --  83.3 82.1  PLT  --  236 286   Cardiac Enzymes: No results for input(s): "CKTOTAL", "CKMB", "CKMBINDEX", "TROPONINI" in the last 168 hours. BNP: BNP (last 3 results) No results for input(s): "BNP" in the last 8760 hours.  ProBNP (last 3 results) No results for input(s): "PROBNP" in the last 8760 hours.  CBG: No results for input(s): "GLUCAP" in the last 168 hours.     Signed:  Silvano Bilis MD.  [facility] 10/17/2023, 11:54 AM

## 2023-10-17 NOTE — Progress Notes (Signed)
Attempted to see wound today but patient refused. Recommend continued wet to dry as able per patient compliance.

## 2023-10-17 NOTE — Progress Notes (Signed)
[facility] Liaison Note:   (new referral for outpatient palliative services) Notified by MD  of patient/family request for [facility] Palliative Care services at [facility].  Referral submitted today.  Once rehab services are over, patient will be transitioned to Hospice services.    Please call with any hospice or outpatient palliative care related questions.  Thank you for the opportunity to participate in this patient's care.  Redge Gainer, [facility] Liaison [phone]

## 2023-10-17 NOTE — Progress Notes (Signed)
Report called to [facility]. Questions asked and answered.

## 2023-10-17 NOTE — Discharge Instructions (Signed)
Follow up with PCP and pick up medications from pharmacy. Make sure to take all antibiotics. Do not double up on narcotic/pain medication or drink alcohol during this time. Do not drive while taking narcotic medication. Call 911 or return to ER for life threatening issues or other concerns.

## 2023-10-17 NOTE — Progress Notes (Signed)
Patient educated on need to eat breakfast, including protein, and pt verbalizes understanding. Pt stated "I don't want to, just let me go back to sleep". Attempted to reeducate patient and pt refused to drink protein shake. Shake left at bedside. Pt did drink prosource with some juice to take his medication this morning. Attempted to turn patient, pt refused stating he was comfortable. Attempted to educated patient on need for continued wound care, including chlorhexidine bath to prevent CAUTI and pt stated "I don't want that, leave me alone".

## 2023-10-20 DIAGNOSIS — E785 Hyperlipidemia, unspecified: Secondary | ICD-10-CM | POA: Diagnosis not present

## 2023-10-20 DIAGNOSIS — E43 Unspecified severe protein-calorie malnutrition: Secondary | ICD-10-CM | POA: Diagnosis not present

## 2023-10-20 DIAGNOSIS — E876 Hypokalemia: Secondary | ICD-10-CM | POA: Diagnosis not present

## 2023-10-20 DIAGNOSIS — L89150 Pressure ulcer of sacral region, unstageable: Secondary | ICD-10-CM | POA: Diagnosis not present

## 2023-10-20 DIAGNOSIS — L89526 Pressure-induced deep tissue damage of left ankle: Secondary | ICD-10-CM | POA: Diagnosis not present

## 2023-10-20 DIAGNOSIS — L89616 Pressure-induced deep tissue damage of right heel: Secondary | ICD-10-CM | POA: Diagnosis not present

## 2023-10-20 DIAGNOSIS — M791 Myalgia, unspecified site: Secondary | ICD-10-CM | POA: Diagnosis not present

## 2023-10-20 DIAGNOSIS — M8610 Other acute osteomyelitis, unspecified site: Secondary | ICD-10-CM | POA: Diagnosis not present

## 2023-10-20 DIAGNOSIS — L89516 Pressure-induced deep tissue damage of right ankle: Secondary | ICD-10-CM | POA: Diagnosis not present

## 2023-10-20 DIAGNOSIS — I10 Essential (primary) hypertension: Secondary | ICD-10-CM | POA: Diagnosis not present

## 2023-10-22 DIAGNOSIS — M791 Myalgia, unspecified site: Secondary | ICD-10-CM | POA: Diagnosis not present

## 2023-10-22 DIAGNOSIS — M8610 Other acute osteomyelitis, unspecified site: Secondary | ICD-10-CM | POA: Diagnosis not present

## 2023-10-22 DIAGNOSIS — E871 Hypo-osmolality and hyponatremia: Secondary | ICD-10-CM | POA: Diagnosis not present

## 2023-10-22 DIAGNOSIS — R7989 Other specified abnormal findings of blood chemistry: Secondary | ICD-10-CM | POA: Diagnosis not present

## 2023-10-22 DIAGNOSIS — K5190 Ulcerative colitis, unspecified, without complications: Secondary | ICD-10-CM | POA: Diagnosis not present

## 2023-10-24 ENCOUNTER — Encounter: Payer: Self-pay | Admitting: General Surgery

## 2023-10-27 DIAGNOSIS — E785 Hyperlipidemia, unspecified: Secondary | ICD-10-CM | POA: Diagnosis not present

## 2023-10-27 DIAGNOSIS — L89616 Pressure-induced deep tissue damage of right heel: Secondary | ICD-10-CM | POA: Diagnosis not present

## 2023-10-27 DIAGNOSIS — L89516 Pressure-induced deep tissue damage of right ankle: Secondary | ICD-10-CM | POA: Diagnosis not present

## 2023-10-27 DIAGNOSIS — E43 Unspecified severe protein-calorie malnutrition: Secondary | ICD-10-CM | POA: Diagnosis not present

## 2023-10-27 DIAGNOSIS — L89150 Pressure ulcer of sacral region, unstageable: Secondary | ICD-10-CM | POA: Diagnosis not present

## 2023-10-27 DIAGNOSIS — L89526 Pressure-induced deep tissue damage of left ankle: Secondary | ICD-10-CM | POA: Diagnosis not present

## 2023-10-28 DIAGNOSIS — E785 Hyperlipidemia, unspecified: Secondary | ICD-10-CM | POA: Diagnosis not present

## 2023-10-28 DIAGNOSIS — E43 Unspecified severe protein-calorie malnutrition: Secondary | ICD-10-CM | POA: Diagnosis not present

## 2023-10-28 DIAGNOSIS — M8610 Other acute osteomyelitis, unspecified site: Secondary | ICD-10-CM | POA: Diagnosis not present

## 2023-10-28 DIAGNOSIS — I10 Essential (primary) hypertension: Secondary | ICD-10-CM | POA: Diagnosis not present

## 2023-10-28 NOTE — Addendum Note (Signed)
Encounter addended by: Kerin Salen on: 10/28/2023 2:53 PM  Actions taken: Imaging Exam ended

## 2023-10-29 DIAGNOSIS — E559 Vitamin D deficiency, unspecified: Secondary | ICD-10-CM | POA: Diagnosis not present

## 2023-10-29 DIAGNOSIS — Z8673 Personal history of transient ischemic attack (TIA), and cerebral infarction without residual deficits: Secondary | ICD-10-CM | POA: Diagnosis not present

## 2023-10-29 DIAGNOSIS — M8610 Other acute osteomyelitis, unspecified site: Secondary | ICD-10-CM | POA: Diagnosis not present

## 2023-10-29 DIAGNOSIS — I82409 Acute embolism and thrombosis of unspecified deep veins of unspecified lower extremity: Secondary | ICD-10-CM | POA: Diagnosis not present

## 2023-10-29 DIAGNOSIS — G4700 Insomnia, unspecified: Secondary | ICD-10-CM | POA: Diagnosis not present

## 2023-10-29 DIAGNOSIS — D649 Anemia, unspecified: Secondary | ICD-10-CM | POA: Diagnosis not present

## 2023-10-29 DIAGNOSIS — E43 Unspecified severe protein-calorie malnutrition: Secondary | ICD-10-CM | POA: Diagnosis not present

## 2023-10-29 DIAGNOSIS — K5190 Ulcerative colitis, unspecified, without complications: Secondary | ICD-10-CM | POA: Diagnosis not present

## 2023-10-31 DIAGNOSIS — S31000A Unspecified open wound of lower back and pelvis without penetration into retroperitoneum, initial encounter: Secondary | ICD-10-CM | POA: Diagnosis not present

## 2023-10-31 DIAGNOSIS — E876 Hypokalemia: Secondary | ICD-10-CM | POA: Diagnosis not present

## 2023-10-31 DIAGNOSIS — R634 Abnormal weight loss: Secondary | ICD-10-CM | POA: Diagnosis not present

## 2023-11-03 DIAGNOSIS — E785 Hyperlipidemia, unspecified: Secondary | ICD-10-CM | POA: Diagnosis not present

## 2023-11-03 DIAGNOSIS — E43 Unspecified severe protein-calorie malnutrition: Secondary | ICD-10-CM | POA: Diagnosis not present

## 2023-11-03 DIAGNOSIS — L89616 Pressure-induced deep tissue damage of right heel: Secondary | ICD-10-CM | POA: Diagnosis not present

## 2023-11-03 DIAGNOSIS — R0989 Other specified symptoms and signs involving the circulatory and respiratory systems: Secondary | ICD-10-CM | POA: Diagnosis not present

## 2023-11-03 DIAGNOSIS — L89150 Pressure ulcer of sacral region, unstageable: Secondary | ICD-10-CM | POA: Diagnosis not present

## 2023-11-03 DIAGNOSIS — L89526 Pressure-induced deep tissue damage of left ankle: Secondary | ICD-10-CM | POA: Diagnosis not present

## 2023-11-03 DIAGNOSIS — R600 Localized edema: Secondary | ICD-10-CM | POA: Diagnosis not present

## 2023-11-03 DIAGNOSIS — L89516 Pressure-induced deep tissue damage of right ankle: Secondary | ICD-10-CM | POA: Diagnosis not present

## 2023-11-04 DIAGNOSIS — S31000A Unspecified open wound of lower back and pelvis without penetration into retroperitoneum, initial encounter: Secondary | ICD-10-CM | POA: Diagnosis not present

## 2023-11-10 DIAGNOSIS — L89526 Pressure-induced deep tissue damage of left ankle: Secondary | ICD-10-CM | POA: Diagnosis not present

## 2023-11-10 DIAGNOSIS — L89516 Pressure-induced deep tissue damage of right ankle: Secondary | ICD-10-CM | POA: Diagnosis not present

## 2023-11-10 DIAGNOSIS — I10 Essential (primary) hypertension: Secondary | ICD-10-CM | POA: Diagnosis not present

## 2023-11-10 DIAGNOSIS — L89150 Pressure ulcer of sacral region, unstageable: Secondary | ICD-10-CM | POA: Diagnosis not present

## 2023-11-10 DIAGNOSIS — D649 Anemia, unspecified: Secondary | ICD-10-CM | POA: Diagnosis not present

## 2023-11-10 DIAGNOSIS — E785 Hyperlipidemia, unspecified: Secondary | ICD-10-CM | POA: Diagnosis not present

## 2023-11-10 DIAGNOSIS — E43 Unspecified severe protein-calorie malnutrition: Secondary | ICD-10-CM | POA: Diagnosis not present

## 2023-11-10 DIAGNOSIS — L89616 Pressure-induced deep tissue damage of right heel: Secondary | ICD-10-CM | POA: Diagnosis not present

## 2023-11-10 DIAGNOSIS — E876 Hypokalemia: Secondary | ICD-10-CM | POA: Diagnosis not present

## 2023-11-10 DIAGNOSIS — R600 Localized edema: Secondary | ICD-10-CM | POA: Diagnosis not present

## 2023-11-11 ENCOUNTER — Ambulatory Visit: Payer: 59 | Attending: Infectious Diseases | Admitting: Infectious Diseases

## 2023-11-12 DIAGNOSIS — E785 Hyperlipidemia, unspecified: Secondary | ICD-10-CM | POA: Diagnosis not present

## 2023-11-12 DIAGNOSIS — E43 Unspecified severe protein-calorie malnutrition: Secondary | ICD-10-CM | POA: Diagnosis not present

## 2023-11-12 LAB — LAB REPORT - SCANNED: EGFR: 90

## 2023-11-13 ENCOUNTER — Telehealth: Payer: Self-pay

## 2023-11-13 NOTE — Telephone Encounter (Signed)
Double book if needed Tues or Thur.

## 2023-11-13 NOTE — Telephone Encounter (Signed)
I reached out to [facility] and spoke to [place] regarding the patient's follow up appointment.  Dr. Nelda Marseille would like to do a video visit with the patient to look at the patient's wound.  Per Harriett Sine patient currently has a wound vac and she will reach out to the charge nurse Selena Batten to see when a good time will be to do a video visit.  Per Harriett Sine she will have Selena Batten give [place] a call back. Akane Tessier Jonathon Resides, CMA

## 2023-11-17 NOTE — Telephone Encounter (Signed)
LVM for charge nurse Belenda Cruise to call me back to get the patient scheduled for video visit. Also advised Belenda Cruise on the VM that Dr. Rivka Safer would like to see the patient's wound.  Dilcia Rybarczyk Jonathon Resides, CMA

## 2023-11-18 DIAGNOSIS — Z515 Encounter for palliative care: Secondary | ICD-10-CM | POA: Diagnosis not present

## 2023-11-19 NOTE — Telephone Encounter (Signed)
Called [facility] to follow up on scheduling appt. Spoke with Belenda Cruise who informed me that patient has wound vac dressing changes on M,W,F. If not able to schedule appointment during those days is not sure if provider will be able to see wound as requested. Is not able to email pictures of wound due to facility policy.  Spoke with Dr Rivka Safer who would like to speak with wound care provider. Belenda Cruise, Rn was able to provide providers contact information. Per Dr. Rivka Safer patient can also have picc pulled. Relayed verbal order to RN who will have picc pulled today.  Rn will fax labs from 12/4 and last note from wound care to office.  Call transferred to scheduling for virtual follow up appt.  Barkley Bruns, NP P: [phone] Email: [email or web address] Juanita Laster, RMA

## 2023-11-20 DIAGNOSIS — L89516 Pressure-induced deep tissue damage of right ankle: Secondary | ICD-10-CM | POA: Diagnosis not present

## 2023-11-20 DIAGNOSIS — L89526 Pressure-induced deep tissue damage of left ankle: Secondary | ICD-10-CM | POA: Diagnosis not present

## 2023-11-20 DIAGNOSIS — L89150 Pressure ulcer of sacral region, unstageable: Secondary | ICD-10-CM | POA: Diagnosis not present

## 2023-11-20 DIAGNOSIS — L89616 Pressure-induced deep tissue damage of right heel: Secondary | ICD-10-CM | POA: Diagnosis not present

## 2023-11-25 DIAGNOSIS — D649 Anemia, unspecified: Secondary | ICD-10-CM | POA: Diagnosis not present

## 2023-11-25 DIAGNOSIS — E785 Hyperlipidemia, unspecified: Secondary | ICD-10-CM | POA: Diagnosis not present

## 2023-11-27 DIAGNOSIS — R7989 Other specified abnormal findings of blood chemistry: Secondary | ICD-10-CM | POA: Diagnosis not present

## 2023-11-27 DIAGNOSIS — L89516 Pressure-induced deep tissue damage of right ankle: Secondary | ICD-10-CM | POA: Diagnosis not present

## 2023-11-27 DIAGNOSIS — L89616 Pressure-induced deep tissue damage of right heel: Secondary | ICD-10-CM | POA: Diagnosis not present

## 2023-11-27 DIAGNOSIS — L89526 Pressure-induced deep tissue damage of left ankle: Secondary | ICD-10-CM | POA: Diagnosis not present

## 2023-11-27 DIAGNOSIS — S31000A Unspecified open wound of lower back and pelvis without penetration into retroperitoneum, initial encounter: Secondary | ICD-10-CM | POA: Diagnosis not present

## 2023-11-27 DIAGNOSIS — L89150 Pressure ulcer of sacral region, unstageable: Secondary | ICD-10-CM | POA: Diagnosis not present

## 2023-11-27 DIAGNOSIS — E876 Hypokalemia: Secondary | ICD-10-CM | POA: Diagnosis not present

## 2023-12-02 DIAGNOSIS — L89616 Pressure-induced deep tissue damage of right heel: Secondary | ICD-10-CM | POA: Diagnosis not present

## 2023-12-02 DIAGNOSIS — L89526 Pressure-induced deep tissue damage of left ankle: Secondary | ICD-10-CM | POA: Diagnosis not present

## 2023-12-02 DIAGNOSIS — E785 Hyperlipidemia, unspecified: Secondary | ICD-10-CM | POA: Diagnosis not present

## 2023-12-02 DIAGNOSIS — L89516 Pressure-induced deep tissue damage of right ankle: Secondary | ICD-10-CM | POA: Diagnosis not present

## 2023-12-02 DIAGNOSIS — R51 Headache with orthostatic component, not elsewhere classified: Secondary | ICD-10-CM | POA: Diagnosis not present

## 2023-12-02 DIAGNOSIS — L89150 Pressure ulcer of sacral region, unstageable: Secondary | ICD-10-CM | POA: Diagnosis not present

## 2023-12-09 ENCOUNTER — Ambulatory Visit: Payer: 59 | Attending: Infectious Diseases | Admitting: Infectious Diseases

## 2023-12-09 DIAGNOSIS — L89154 Pressure ulcer of sacral region, stage 4: Secondary | ICD-10-CM | POA: Diagnosis not present

## 2023-12-09 DIAGNOSIS — E7800 Pure hypercholesterolemia, unspecified: Secondary | ICD-10-CM | POA: Diagnosis not present

## 2023-12-09 DIAGNOSIS — L8996 Pressure-induced deep tissue damage of unspecified site: Secondary | ICD-10-CM | POA: Diagnosis present

## 2023-12-09 DIAGNOSIS — R51 Headache with orthostatic component, not elsewhere classified: Secondary | ICD-10-CM | POA: Diagnosis not present

## 2023-12-09 DIAGNOSIS — D649 Anemia, unspecified: Secondary | ICD-10-CM | POA: Insufficient documentation

## 2023-12-09 DIAGNOSIS — L89159 Pressure ulcer of sacral region, unspecified stage: Secondary | ICD-10-CM | POA: Insufficient documentation

## 2023-12-09 DIAGNOSIS — E8809 Other disorders of plasma-protein metabolism, not elsewhere classified: Secondary | ICD-10-CM | POA: Insufficient documentation

## 2023-12-09 NOTE — Progress Notes (Signed)
 The purpose of this virtual visit is to provide medical care while limiting exposure to the novel coronavirus (COVID19) for both patient and office staff.   Consent was obtained for video visit:  Yes.   Answered questions that patient had about telehealth interaction:  Yes.   I discussed the limitations, risks, security and privacy concerns of performing an evaluation and management service by telephone. I also discussed with the patient that there may be a patient responsible charge related to this service. The patient expressed understanding and agreed to proceed.   Patient Location: rehab- [facility] Provider Location: office  Follow up for sacral decubitus  Pt was in [facility] in OCT/NOV and had debridement and then was discharged to [facility] on ertapenem  1 gram IV every 24 hrs for 4 weeks which he completed on 11/11/23 and PICC was removed He is followed by wound care consultant Pt had wound vac which has been removed now  He is doing well Wound is healing well with red granulation tissue    Labs Hb 8.3 Cr < 1  Impression/recommendation Sacral decubitus much improved Continue topical care No need for antibiotic Anemia Hypoalbuminemia These 2 will have to be addressed to improve the decubitus along with offloading pressure   CVA  PT is discharged from my clinic Discussed with nurse manager at [facility] Total time spent  on video call 20 min

## 2023-12-11 DIAGNOSIS — L89516 Pressure-induced deep tissue damage of right ankle: Secondary | ICD-10-CM | POA: Diagnosis not present

## 2023-12-11 DIAGNOSIS — L89150 Pressure ulcer of sacral region, unstageable: Secondary | ICD-10-CM | POA: Diagnosis not present

## 2023-12-11 DIAGNOSIS — L89526 Pressure-induced deep tissue damage of left ankle: Secondary | ICD-10-CM | POA: Diagnosis not present

## 2023-12-11 DIAGNOSIS — L89616 Pressure-induced deep tissue damage of right heel: Secondary | ICD-10-CM | POA: Diagnosis not present

## 2023-12-16 DIAGNOSIS — E785 Hyperlipidemia, unspecified: Secondary | ICD-10-CM | POA: Diagnosis not present

## 2023-12-16 DIAGNOSIS — R51 Headache with orthostatic component, not elsewhere classified: Secondary | ICD-10-CM | POA: Diagnosis not present

## 2023-12-16 DIAGNOSIS — R569 Unspecified convulsions: Secondary | ICD-10-CM | POA: Diagnosis not present

## 2023-12-16 DIAGNOSIS — E7800 Pure hypercholesterolemia, unspecified: Secondary | ICD-10-CM | POA: Diagnosis not present

## 2023-12-18 DIAGNOSIS — L89616 Pressure-induced deep tissue damage of right heel: Secondary | ICD-10-CM | POA: Diagnosis not present

## 2023-12-18 DIAGNOSIS — L89526 Pressure-induced deep tissue damage of left ankle: Secondary | ICD-10-CM | POA: Diagnosis not present

## 2023-12-18 DIAGNOSIS — L89516 Pressure-induced deep tissue damage of right ankle: Secondary | ICD-10-CM | POA: Diagnosis not present

## 2023-12-18 DIAGNOSIS — L89150 Pressure ulcer of sacral region, unstageable: Secondary | ICD-10-CM | POA: Diagnosis not present

## 2023-12-22 DIAGNOSIS — L89150 Pressure ulcer of sacral region, unstageable: Secondary | ICD-10-CM | POA: Diagnosis not present

## 2023-12-22 DIAGNOSIS — L89616 Pressure-induced deep tissue damage of right heel: Secondary | ICD-10-CM | POA: Diagnosis not present

## 2023-12-22 DIAGNOSIS — L89526 Pressure-induced deep tissue damage of left ankle: Secondary | ICD-10-CM | POA: Diagnosis not present

## 2023-12-22 DIAGNOSIS — L89516 Pressure-induced deep tissue damage of right ankle: Secondary | ICD-10-CM | POA: Diagnosis not present

## 2023-12-23 DIAGNOSIS — I639 Cerebral infarction, unspecified: Secondary | ICD-10-CM | POA: Diagnosis not present

## 2023-12-23 DIAGNOSIS — M869 Osteomyelitis, unspecified: Secondary | ICD-10-CM | POA: Diagnosis not present

## 2023-12-23 DIAGNOSIS — I10 Essential (primary) hypertension: Secondary | ICD-10-CM | POA: Diagnosis not present

## 2023-12-23 DIAGNOSIS — R531 Weakness: Secondary | ICD-10-CM | POA: Diagnosis not present

## 2023-12-24 DIAGNOSIS — Z79899 Other long term (current) drug therapy: Secondary | ICD-10-CM | POA: Diagnosis not present

## 2023-12-24 DIAGNOSIS — R52 Pain, unspecified: Secondary | ICD-10-CM | POA: Diagnosis not present

## 2023-12-24 DIAGNOSIS — S31000A Unspecified open wound of lower back and pelvis without penetration into retroperitoneum, initial encounter: Secondary | ICD-10-CM | POA: Diagnosis not present

## 2023-12-24 DIAGNOSIS — R531 Weakness: Secondary | ICD-10-CM | POA: Diagnosis not present

## 2023-12-29 DIAGNOSIS — L89526 Pressure-induced deep tissue damage of left ankle: Secondary | ICD-10-CM | POA: Diagnosis not present

## 2023-12-29 DIAGNOSIS — L89150 Pressure ulcer of sacral region, unstageable: Secondary | ICD-10-CM | POA: Diagnosis not present

## 2023-12-29 DIAGNOSIS — L89516 Pressure-induced deep tissue damage of right ankle: Secondary | ICD-10-CM | POA: Diagnosis not present

## 2023-12-29 DIAGNOSIS — L89616 Pressure-induced deep tissue damage of right heel: Secondary | ICD-10-CM | POA: Diagnosis not present

## 2024-01-08 DIAGNOSIS — L89516 Pressure-induced deep tissue damage of right ankle: Secondary | ICD-10-CM | POA: Diagnosis not present

## 2024-01-08 DIAGNOSIS — L89616 Pressure-induced deep tissue damage of right heel: Secondary | ICD-10-CM | POA: Diagnosis not present

## 2024-01-08 DIAGNOSIS — L89150 Pressure ulcer of sacral region, unstageable: Secondary | ICD-10-CM | POA: Diagnosis not present

## 2024-01-08 DIAGNOSIS — L89526 Pressure-induced deep tissue damage of left ankle: Secondary | ICD-10-CM | POA: Diagnosis not present

## 2024-01-15 DIAGNOSIS — L89514 Pressure ulcer of right ankle, stage 4: Secondary | ICD-10-CM | POA: Diagnosis not present

## 2024-01-15 DIAGNOSIS — L89526 Pressure-induced deep tissue damage of left ankle: Secondary | ICD-10-CM | POA: Diagnosis not present

## 2024-01-15 DIAGNOSIS — L89150 Pressure ulcer of sacral region, unstageable: Secondary | ICD-10-CM | POA: Diagnosis not present

## 2024-01-15 DIAGNOSIS — L89616 Pressure-induced deep tissue damage of right heel: Secondary | ICD-10-CM | POA: Diagnosis not present

## 2024-01-22 DIAGNOSIS — L89526 Pressure-induced deep tissue damage of left ankle: Secondary | ICD-10-CM | POA: Diagnosis not present

## 2024-01-22 DIAGNOSIS — L89616 Pressure-induced deep tissue damage of right heel: Secondary | ICD-10-CM | POA: Diagnosis not present

## 2024-01-22 DIAGNOSIS — L89150 Pressure ulcer of sacral region, unstageable: Secondary | ICD-10-CM | POA: Diagnosis not present

## 2024-01-22 DIAGNOSIS — L89514 Pressure ulcer of right ankle, stage 4: Secondary | ICD-10-CM | POA: Diagnosis not present

## 2024-01-30 DIAGNOSIS — L89616 Pressure-induced deep tissue damage of right heel: Secondary | ICD-10-CM | POA: Diagnosis not present

## 2024-01-30 DIAGNOSIS — L89514 Pressure ulcer of right ankle, stage 4: Secondary | ICD-10-CM | POA: Diagnosis not present

## 2024-01-30 DIAGNOSIS — L89150 Pressure ulcer of sacral region, unstageable: Secondary | ICD-10-CM | POA: Diagnosis not present

## 2024-01-30 DIAGNOSIS — L89526 Pressure-induced deep tissue damage of left ankle: Secondary | ICD-10-CM | POA: Diagnosis not present

## 2024-02-05 DIAGNOSIS — L89616 Pressure-induced deep tissue damage of right heel: Secondary | ICD-10-CM | POA: Diagnosis not present

## 2024-02-05 DIAGNOSIS — L89514 Pressure ulcer of right ankle, stage 4: Secondary | ICD-10-CM | POA: Diagnosis not present

## 2024-02-05 DIAGNOSIS — L89526 Pressure-induced deep tissue damage of left ankle: Secondary | ICD-10-CM | POA: Diagnosis not present

## 2024-02-05 DIAGNOSIS — L89150 Pressure ulcer of sacral region, unstageable: Secondary | ICD-10-CM | POA: Diagnosis not present

## 2024-02-06 DIAGNOSIS — I10 Essential (primary) hypertension: Secondary | ICD-10-CM | POA: Diagnosis not present

## 2024-02-10 DIAGNOSIS — R52 Pain, unspecified: Secondary | ICD-10-CM | POA: Diagnosis not present

## 2024-02-10 DIAGNOSIS — M791 Myalgia, unspecified site: Secondary | ICD-10-CM | POA: Diagnosis not present

## 2024-02-10 DIAGNOSIS — T148XXA Other injury of unspecified body region, initial encounter: Secondary | ICD-10-CM | POA: Diagnosis not present

## 2024-02-10 DIAGNOSIS — L089 Local infection of the skin and subcutaneous tissue, unspecified: Secondary | ICD-10-CM | POA: Diagnosis not present

## 2024-02-19 DIAGNOSIS — I959 Hypotension, unspecified: Secondary | ICD-10-CM | POA: Diagnosis not present

## 2024-02-19 DIAGNOSIS — I82409 Acute embolism and thrombosis of unspecified deep veins of unspecified lower extremity: Secondary | ICD-10-CM | POA: Diagnosis not present

## 2024-02-19 DIAGNOSIS — E559 Vitamin D deficiency, unspecified: Secondary | ICD-10-CM | POA: Diagnosis not present

## 2024-02-19 DIAGNOSIS — D509 Iron deficiency anemia, unspecified: Secondary | ICD-10-CM | POA: Diagnosis not present

## 2024-03-09 DIAGNOSIS — G8929 Other chronic pain: Secondary | ICD-10-CM | POA: Diagnosis not present

## 2024-03-09 DIAGNOSIS — Z515 Encounter for palliative care: Secondary | ICD-10-CM | POA: Diagnosis not present

## 2024-03-09 DIAGNOSIS — R451 Restlessness and agitation: Secondary | ICD-10-CM | POA: Diagnosis not present

## 2024-03-09 DIAGNOSIS — Z760 Encounter for issue of repeat prescription: Secondary | ICD-10-CM | POA: Diagnosis not present

## 2024-04-08 DEATH — deceased
